# Patient Record
Sex: Female | Born: 1985 | Race: Black or African American | Hispanic: No | Marital: Single
Health system: Southern US, Community
[De-identification: ages and names within clinical notes are randomized; demographics above are authoritative.]

## PROBLEM LIST (undated history)

## (undated) ENCOUNTER — Inpatient Hospital Stay (HOSPITAL_COMMUNITY): Payer: Self-pay

## (undated) ENCOUNTER — Ambulatory Visit (HOSPITAL_COMMUNITY): Discharge status: Against Medical Advice | Payer: No Payment, Other

## (undated) DIAGNOSIS — F1290 Cannabis use, unspecified, uncomplicated: Secondary | ICD-10-CM

## (undated) DIAGNOSIS — F17200 Nicotine dependence, unspecified, uncomplicated: Secondary | ICD-10-CM

## (undated) DIAGNOSIS — A6000 Herpesviral infection of urogenital system, unspecified: Secondary | ICD-10-CM

## (undated) DIAGNOSIS — N63 Unspecified lump in unspecified breast: Secondary | ICD-10-CM

## (undated) HISTORY — PX: BREAST SURGERY: SHX581

## (undated) HISTORY — DX: Cannabis use, unspecified, uncomplicated: F12.90

## (undated) HISTORY — DX: Unspecified lump in unspecified breast: N63.0

## (undated) HISTORY — DX: Nicotine dependence, unspecified, uncomplicated: F17.200

---
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
## 2006-01-28 ENCOUNTER — Emergency Department (HOSPITAL_COMMUNITY): Admission: EM | Admit: 2006-01-28 | Discharge: 2006-01-28 | Payer: Self-pay | Admitting: Emergency Medicine

## 2007-03-13 ENCOUNTER — Inpatient Hospital Stay (HOSPITAL_COMMUNITY): Admission: AD | Admit: 2007-03-13 | Discharge: 2007-03-13 | Payer: Self-pay | Admitting: Obstetrics & Gynecology

## 2008-01-10 ENCOUNTER — Emergency Department (HOSPITAL_COMMUNITY): Admission: EM | Admit: 2008-01-10 | Discharge: 2008-01-10 | Payer: Self-pay | Admitting: Emergency Medicine

## 2008-01-22 ENCOUNTER — Emergency Department (HOSPITAL_COMMUNITY): Admission: EM | Admit: 2008-01-22 | Discharge: 2008-01-22 | Payer: Self-pay | Admitting: Emergency Medicine

## 2008-03-08 ENCOUNTER — Inpatient Hospital Stay (HOSPITAL_COMMUNITY): Admission: AD | Admit: 2008-03-08 | Discharge: 2008-03-08 | Payer: Self-pay | Admitting: Obstetrics & Gynecology

## 2008-12-12 HISTORY — PX: NOSE SURGERY: SHX723

## 2009-08-29 ENCOUNTER — Emergency Department (HOSPITAL_COMMUNITY): Admission: EM | Admit: 2009-08-29 | Discharge: 2009-08-29 | Payer: Self-pay | Admitting: Emergency Medicine

## 2009-10-03 ENCOUNTER — Inpatient Hospital Stay (HOSPITAL_COMMUNITY): Admission: AD | Admit: 2009-10-03 | Discharge: 2009-10-04 | Payer: Self-pay | Admitting: Family Medicine

## 2010-05-13 ENCOUNTER — Emergency Department (HOSPITAL_COMMUNITY): Admission: EM | Admit: 2010-05-13 | Discharge: 2010-05-13 | Payer: Self-pay | Admitting: Emergency Medicine

## 2010-05-16 ENCOUNTER — Emergency Department (HOSPITAL_COMMUNITY): Admission: EM | Admit: 2010-05-16 | Discharge: 2010-05-16 | Payer: Self-pay | Admitting: Emergency Medicine

## 2011-02-28 LAB — BASIC METABOLIC PANEL
BUN: 7 mg/dL (ref 6–23)
Chloride: 109 mEq/L (ref 96–112)
GFR calc non Af Amer: >60 mL/min (ref >60)
Potassium: 3.7 mEq/L (ref 3.5–5.1)
Sodium: 140 mEq/L (ref 135–145)

## 2011-02-28 LAB — CBC
HCT: 33.7 % — ABNORMAL LOW (ref 36.0–46.0)
Hemoglobin: 11.4 g/dL — ABNORMAL LOW (ref 12.0–15.0)
MCHC: 33.9 g/dL (ref 30.0–36.0)
MCV: 88.5 fL (ref 78.0–100.0)
Platelets: 330 10*3/uL (ref 150–400)
RBC: 3.81 MIL/uL — ABNORMAL LOW (ref 3.87–5.11)
RDW: 15 % (ref 11.5–15.5)
WBC: 6 10*3/uL (ref 4.0–10.5)

## 2011-02-28 LAB — URINE MICROSCOPIC-ADD ON

## 2011-02-28 LAB — URINALYSIS, ROUTINE W REFLEX MICROSCOPIC
Bilirubin Urine: NEGATIVE
Hgb urine dipstick: NEGATIVE
Nitrite: NEGATIVE
Specific Gravity, Urine: 1.025 (ref 1.005–1.030)
Urobilinogen, UA: 0.2 mg/dL (ref 0.0–1.0)
pH: 7.5 (ref 5.0–8.0)

## 2011-02-28 LAB — RAPID URINE DRUG SCREEN, HOSP PERFORMED
Amphetamines: NOT DETECTED
Opiates: NOT DETECTED
Tetrahydrocannabinol: POSITIVE — AB

## 2011-02-28 LAB — D-DIMER, QUANTITATIVE

## 2011-02-28 LAB — DIFFERENTIAL
Basophils Relative: 1 % (ref 0–1)
Eosinophils Absolute: 0.1 10*3/uL (ref 0.0–0.7)
Neutrophils Relative %: 54 % (ref 43–77)

## 2011-03-17 LAB — GC/CHLAMYDIA PROBE AMP, GENITAL

## 2011-04-29 NOTE — Discharge Summary (Signed)
[facility]  Patient:    Christy West, Christy West Visit Number: [id number] MRN: [id number]          Service Type: GYN Location: [id number] Attending Physician:  Douglass Rivers Dictated by:   Antony Contras, N.P. Admit Date:  07/28/2001 Discharge Date: 07/30/2001                             Discharge Summary  DISCHARGE DIAGNOSES:          Positive GC culture, questionable pelvic inflammatory disease.  HISTORY OF PRESENT ILLNESS:   Patient is a 25 year old sexually promiscuous female who presents with mid epigastric pain radiating to the right causing her to double over which brought her to the hospital.  Prior to the hospital emergency room temperature was 101.3 on admission.  On examination per Dr. Farrel Gobble she was noted to have frank pus from the cervix.  The wet prep was too numerous to count wbcs, no BV, no yeast.  Pelvic tenderness was also noted. The patient did report a history of seven sexual partners.  Her last intercourse was two weeks ago.  She has never had a gynecologic examination. She was admitted for observation and also antibiotic therapy.  HOSPITAL COURSE:              Ultrasound revealed a small amount of free fluid, no abscess, extensive loops of bowel, bloated rectum.  CT with rectal contrast:  No perforation, dilated loops of bowel, fluid, appendix not visualized.  Patient was treated with Rocephin and doxycycline.  HIV, RPR were also obtained.  Her gonorrhea culture did return positive.  She was also complaining of some pain with urination so a urine culture was also checked. She was able to be discharged on July 30, 2001 in satisfactory condition. She is to follow-up in the office in two weeks for recheck and in four weeks for TOC.  She also needs to have a Pap smear.  She was discharged also on Floxin 400 mg b.i.d. #10.  Discharge home instructions were given to her grandmother. Dictated by:   Antony Contras, N.P. Attending  Physician:  Douglass Rivers DD:  08/10/01 TD:  08/10/01 Job: [id number]

## 2011-06-06 ENCOUNTER — Emergency Department (HOSPITAL_COMMUNITY)
Admission: EM | Admit: 2011-06-06 | Discharge: 2011-06-06 | Discharge status: Against Medical Advice | Payer: Self-pay | Attending: Emergency Medicine | Admitting: Emergency Medicine

## 2011-06-06 DIAGNOSIS — Z0389 Encounter for observation for other suspected diseases and conditions ruled out: Secondary | ICD-10-CM | POA: Insufficient documentation

## 2011-09-05 LAB — WET PREP, GENITAL

## 2011-09-05 LAB — POCT PREGNANCY, URINE

## 2013-03-02 ENCOUNTER — Inpatient Hospital Stay (HOSPITAL_COMMUNITY)
Admission: AD | Admit: 2013-03-02 | Discharge: 2013-03-02 | Disposition: A | Discharge status: Home / Self Care | Payer: Self-pay | Source: Ambulatory Visit | Attending: Obstetrics and Gynecology | Admitting: Obstetrics and Gynecology

## 2013-03-02 ENCOUNTER — Encounter (HOSPITAL_COMMUNITY): Payer: Self-pay | Admitting: Obstetrics and Gynecology

## 2013-03-02 DIAGNOSIS — R209 Unspecified disturbances of skin sensation: Secondary | ICD-10-CM | POA: Insufficient documentation

## 2013-03-02 DIAGNOSIS — R109 Unspecified abdominal pain: Secondary | ICD-10-CM | POA: Insufficient documentation

## 2013-03-02 DIAGNOSIS — N949 Unspecified condition associated with female genital organs and menstrual cycle: Secondary | ICD-10-CM | POA: Insufficient documentation

## 2013-03-02 LAB — URINE MICROSCOPIC-ADD ON

## 2013-03-02 LAB — URINALYSIS, ROUTINE W REFLEX MICROSCOPIC
Glucose, UA: NEGATIVE mg/dL
Ketones, ur: NEGATIVE mg/dL
Protein, ur: NEGATIVE mg/dL
pH: 6 (ref 5.0–8.0)

## 2013-03-02 NOTE — MAU Note (Signed)
"  I've been having a discharge and a tingling.  No itching or burning.  It has been for about a month."

## 2013-03-02 NOTE — MAU Note (Signed)
Christy West presents with Lower abdominal pain that started 2-3 weeks ago. She has had 2 menstrual cycles this month and feels like something is not right.

## 2013-03-03 ENCOUNTER — Encounter (HOSPITAL_COMMUNITY): Payer: Self-pay | Admitting: Emergency Medicine

## 2013-03-03 ENCOUNTER — Emergency Department (HOSPITAL_COMMUNITY): Payer: Self-pay

## 2013-03-03 ENCOUNTER — Emergency Department (HOSPITAL_COMMUNITY)
Admission: EM | Admit: 2013-03-03 | Discharge: 2013-03-03 | Disposition: A | Discharge status: Home / Self Care | Payer: Self-pay | Attending: Emergency Medicine | Admitting: Emergency Medicine

## 2013-03-03 DIAGNOSIS — N949 Unspecified condition associated with female genital organs and menstrual cycle: Secondary | ICD-10-CM | POA: Insufficient documentation

## 2013-03-03 DIAGNOSIS — R109 Unspecified abdominal pain: Secondary | ICD-10-CM

## 2013-03-03 DIAGNOSIS — N898 Other specified noninflammatory disorders of vagina: Secondary | ICD-10-CM | POA: Insufficient documentation

## 2013-03-03 DIAGNOSIS — R1031 Right lower quadrant pain: Secondary | ICD-10-CM | POA: Insufficient documentation

## 2013-03-03 DIAGNOSIS — Z3202 Encounter for pregnancy test, result negative: Secondary | ICD-10-CM | POA: Insufficient documentation

## 2013-03-03 DIAGNOSIS — F17200 Nicotine dependence, unspecified, uncomplicated: Secondary | ICD-10-CM | POA: Insufficient documentation

## 2013-03-03 LAB — URINE MICROSCOPIC-ADD ON

## 2013-03-03 LAB — URINALYSIS, ROUTINE W REFLEX MICROSCOPIC
Ketones, ur: NEGATIVE mg/dL
Nitrite: NEGATIVE
Protein, ur: NEGATIVE mg/dL
Urobilinogen, UA: 0.2 mg/dL (ref 0.0–1.0)

## 2013-03-03 LAB — WET PREP, GENITAL
Clue Cells Wet Prep HPF POC: NONE SEEN
Trich, Wet Prep: NONE SEEN
Yeast Wet Prep HPF POC: NONE SEEN

## 2013-03-03 LAB — CBC WITH DIFFERENTIAL/PLATELET
Eosinophils Relative: 1 % (ref 0–5)
HCT: 33.6 % — ABNORMAL LOW (ref 36.0–46.0)
Hemoglobin: 11.4 g/dL — ABNORMAL LOW (ref 12.0–15.0)
Lymphocytes Relative: 31 % (ref 12–46)
Lymphs Abs: 2.2 10*3/uL (ref 0.7–4.0)
MCV: 83.6 fL (ref 78.0–100.0)
Monocytes Absolute: 0.6 10*3/uL (ref 0.1–1.0)
Monocytes Relative: 8 % (ref 3–12)
Neutro Abs: 4.4 10*3/uL (ref 1.7–7.7)
RBC: 4.02 MIL/uL (ref 3.87–5.11)
WBC: 7.3 10*3/uL (ref 4.0–10.5)

## 2013-03-03 LAB — URINALYSIS, MICROSCOPIC ONLY
Bilirubin Urine: NEGATIVE
Nitrite: NEGATIVE
Protein, ur: NEGATIVE mg/dL
Specific Gravity, Urine: 1.029 (ref 1.005–1.030)
Urobilinogen, UA: 0.2 mg/dL (ref 0.0–1.0)

## 2013-03-03 LAB — COMPREHENSIVE METABOLIC PANEL
AST: 12 U/L (ref 0–37)
CO2: 26 mEq/L (ref 19–32)
Calcium: 9.7 mg/dL (ref 8.4–10.5)
Chloride: 101 mEq/L (ref 96–112)
Creatinine, Ser: 0.74 mg/dL (ref 0.50–1.10)
GFR calc Af Amer: >90 mL/min (ref >90)
GFR calc non Af Amer: >90 mL/min (ref >90)
Glucose, Bld: 103 mg/dL — ABNORMAL HIGH (ref 70–99)
Total Bilirubin: 0.1 mg/dL — ABNORMAL LOW (ref 0.3–1.2)

## 2013-03-03 MED ORDER — IBUPROFEN 600 MG PO TABS: 600.0000 mg | ORAL_TABLET | Freq: Four times a day (QID) | ORAL | Status: DC | PRN

## 2013-03-03 MED ORDER — IOHEXOL 300 MG/ML  SOLN
50.0000 mL | Freq: Once | INTRAMUSCULAR | Status: AC | PRN
  Administered 2013-03-03: 50 mL via ORAL

## 2013-03-03 MED ORDER — IOHEXOL 300 MG/ML  SOLN
100.0000 mL | Freq: Once | INTRAMUSCULAR | Status: AC | PRN
  Administered 2013-03-03: 100 mL via INTRAVENOUS

## 2013-03-03 MED ORDER — HYDROCODONE-ACETAMINOPHEN 5-325 MG PO TABS: ORAL_TABLET | ORAL | Status: DC

## 2013-03-03 NOTE — ED Notes (Signed)
Pt is having lower abdominal/pelvic pain. Pt states that she is having burning and pressure in her pelvic area when she urinates. Pt states that she also has discharge which is normal for her recently in the last few months. Pt denies vaginal bleeding. Pt denies problems with bowels.

## 2013-03-03 NOTE — ED Notes (Signed)
11- Ct not completed yet. Clicked in error

## 2013-03-03 NOTE — ED Notes (Signed)
Pt has completed PO contrast. CT notified. Pt is sleeping.

## 2013-03-03 NOTE — ED Notes (Signed)
Returned from CT.

## 2013-03-03 NOTE — ED Notes (Signed)
Pt is in CT

## 2013-03-03 NOTE — ED Provider Notes (Signed)
6:05 AM Handoff from [facility] NP -- patient with 2 weeks RLQ pain. Work-up largely unconcerning, CT pending to r/o appendicitis, other significant etiology. Pelvic exam benign without discharge.   7:23 AM CT scan reviewed by myself. No acute findings to explain patient's abdominal pain.  Patient seen and examined. Abdomen is soft and nontender. Patient informed of all results.  The patient was urged to return to the Emergency Department immediately with worsening of current symptoms, worsening abdominal pain, persistent vomiting, blood noted in stools, fever, or any other concerns. The patient verbalized understanding.   Patient urged followup with primary care physician for further evaluation of her pain.  Patient counseled on use of narcotic pain medications. Counseled not to combine these medications with others containing tylenol. Urged not to drink alcohol, drive, or perform any other activities that requires focus while taking these medications. The patient verbalizes understanding and agrees with the plan.  Exam:  Gen NAD; Heart RRR, nml S1,S2, no m/r/g; Lungs CTAB; Abd soft, NT, no rebound or guarding; Ext 2+ pedal pulses bilaterally, no edema.     Renne Crigler, PA-C 03/03/13 [phone]

## 2013-03-03 NOTE — ED Notes (Signed)
Pt reports 2 weeks of having pain to R lower abd/groin with radiation to L side and pain in lower back; pt reports very small amt of pain when voiding; pt reports on 2nd period of month; pt denies n/v

## 2013-03-03 NOTE — ED Provider Notes (Signed)
History     CSN: [id number]  Arrival date & time 03/03/13  0108   First MD Initiated Contact with Patient 03/03/13 0208      Chief Complaint  Patient presents with  . Abdominal Pain    (Consider location/radiation/quality/duration/timing/severity/associated sxs/prior treatment) HPI Comments: Patient reports she's had 3, weeks of persistent right lower quadrant pain, radiating to her groin has a slight vaginal discharge.  Denies dysuria, frequency, comes in tonight because the pain is worse.  She also reports, that she's had 2 menstrual cycles.  This month.  She has one sexual partner/husband has been out of town for the past 90 days.   Patient is a 27 y.o. female presenting with abdominal pain. The history is provided by the patient.  Abdominal Pain Pain location:  RLQ Pain quality: aching, fullness and stabbing   Pain radiates to:  Does not radiate Pain severity:  Moderate Onset quality:  Gradual Duration:  3 weeks Timing:  Constant Progression:  Worsening Chronicity:  New Context: not recent illness, not recent sexual activity, not recent travel and not trauma   Relieved by:  Nothing Worsened by:  Nothing tried Ineffective treatments:  None tried Associated symptoms: vaginal discharge   Associated symptoms: no chills, no constipation, no cough, no diarrhea, no dysuria, no fever and no vaginal bleeding     History reviewed. No pertinent past medical history.  Past Surgical History  Procedure Laterality Date  . Nose surgery  2010    repair    History reviewed. No pertinent family history.  History  Substance Use Topics  . Smoking status: Current Every Day Smoker -- 1.00 packs/day    Types: Cigarettes  . Smokeless tobacco: Not on file  . Alcohol Use: Yes    OB History   Grav Para Term Preterm Abortions TAB SAB Ect Mult Living   0               Review of Systems  Constitutional: Negative for fever and chills.  Respiratory: Negative for cough.     Gastrointestinal: Positive for abdominal pain. Negative for diarrhea and constipation.  Genitourinary: Positive for vaginal discharge, menstrual problem and pelvic pain. Negative for dysuria, urgency, flank pain, decreased urine volume and vaginal bleeding.  Musculoskeletal: Negative for myalgias and back pain.  All other systems reviewed and are negative.    Allergies  Review of patient's allergies indicates no known allergies.  Home Medications   Current Outpatient Rx  Name  Route  Sig  Dispense  Refill  . HYDROcodone-acetaminophen (NORCO/VICODIN) 5-325 MG per tablet      Take 1-2 tablets every 6 hours as needed for severe pain   10 tablet   0   . ibuprofen (ADVIL,MOTRIN) 600 MG tablet   Oral   Take 1 tablet (600 mg total) by mouth every 6 (six) hours as needed for pain.   20 tablet   0     BP 111/69  Pulse 74  Temp(Src) 97.9 F (36.6 C) (Oral)  Resp 16  SpO2 98%  LMP 02/13/2013  Physical Exam  Constitutional: She appears well-developed and well-nourished.  HENT:  Head: Normocephalic.  Eyes: Pupils are equal, round, and reactive to light.  Neck: Normal range of motion.  Cardiovascular: Normal rate.   Pulmonary/Chest: Effort normal.  Abdominal: Bowel sounds are normal. She exhibits no distension. There is no hepatosplenomegaly. There is tenderness in the right lower quadrant. There is no rigidity, no rebound, no guarding, no CVA tenderness and negative Murphy's  sign.      ED Course  Procedures (including critical care time)  Labs Reviewed  WET PREP, GENITAL - Abnormal; Notable for the following:    WBC, Wet Prep HPF POC MODERATE (*)    All other components within normal limits  URINALYSIS, MICROSCOPIC ONLY - Abnormal; Notable for the following:    Hgb urine dipstick MODERATE (*)    Bacteria, UA MANY (*)    Squamous Epithelial / LPF FEW (*)    All other components within normal limits  CBC WITH DIFFERENTIAL - Abnormal; Notable for the following:     Hemoglobin 11.4 (*)    HCT 33.6 (*)    All other components within normal limits  COMPREHENSIVE METABOLIC PANEL - Abnormal; Notable for the following:    Glucose, Bld 103 (*)    Total Bilirubin 0.1 (*)    All other components within normal limits  URINALYSIS, ROUTINE W REFLEX MICROSCOPIC - Abnormal; Notable for the following:    APPearance CLOUDY (*)    Leukocytes, UA TRACE (*)    All other components within normal limits  URINE MICROSCOPIC-ADD ON - Abnormal; Notable for the following:    Squamous Epithelial / LPF FEW (*)    Bacteria, UA FEW (*)    All other components within normal limits  URINE CULTURE  GC/CHLAMYDIA PROBE AMP  URINE CULTURE  POCT PREGNANCY, URINE   Ct Abdomen Pelvis W Contrast  03/03/2013  *RADIOLOGY REPORT*  Clinical Data: Pelvic pain  CT ABDOMEN AND PELVIS WITH CONTRAST  Technique:  Multidetector CT imaging of the abdomen and pelvis was performed following the standard protocol during bolus administration of intravenous contrast.  Contrast: OMNIPAQUE IOHEXOL 300 MG/ML  SOLN, 50mL OMNIPAQUE IOHEXOL 300 MG/ML  SOLN  Comparison: None.  Findings: Limited images through the lung bases demonstrate no significant appreciable abnormality. The heart size is within normal limits. No pleural or pericardial effusion.  Unremarkable liver, spleen, pancreas, biliary system, adrenal glands.  Symmetric renal enhancement.  No hydronephrosis or hydroureter.  Decompressed colon limits evaluation.  Normal appendix.  Normal terminal ileum.  Small bowel loops normal course and caliber.  No free intraperitoneal air.  No lymphadenopathy.  Small amount of free fluid along the inferior margin of the liver, right paracolic gutter, and within the pelvis.  Normal caliber aorta and branch vessels.  Thin-walled bladder.  Nonspecific appearance to the uterus. Probable Nabothian cyst.  Right corpus luteal cyst.  No adnexal mass.  No acute osseous finding.  IMPRESSION: Normal appendix.  No  hydronephrosis.  Small amount of free intraperitoneal fluid is likely physiologic.  Pelvic ultrasound has increased sensitivity and specificity if there is clinical concern for acute pelvic pathology.   Original Report Authenticated By: Jearld Lesch, M.D.      1. Abdominal pain       MDM           Arman Filter, NP 03/04/13 [phone]

## 2013-03-04 LAB — URINE CULTURE
Colony Count: NO GROWTH
Culture: NO GROWTH

## 2013-03-04 NOTE — ED Provider Notes (Signed)
Medical screening examination/treatment/procedure(s) were performed by non-physician practitioner and as supervising physician I was immediately available for consultation/collaboration.   Charles B. Sheldon, MD 03/04/13 1315 

## 2013-03-04 NOTE — ED Provider Notes (Signed)
Medical screening examination/treatment/procedure(s) were performed by non-physician practitioner and as supervising physician I was immediately available for consultation/collaboration.   Charles B. Bernette Mayers, MD 03/04/13 1315

## 2013-03-05 ENCOUNTER — Encounter (HOSPITAL_COMMUNITY): Payer: Self-pay | Admitting: *Deleted

## 2013-03-05 ENCOUNTER — Inpatient Hospital Stay (HOSPITAL_COMMUNITY): Payer: Self-pay

## 2013-03-05 ENCOUNTER — Inpatient Hospital Stay (HOSPITAL_COMMUNITY)
Admission: AD | Admit: 2013-03-05 | Discharge: 2013-03-05 | Disposition: A | Discharge status: Home / Self Care | Payer: Self-pay | Source: Ambulatory Visit | Attending: Family Medicine | Admitting: Family Medicine

## 2013-03-05 DIAGNOSIS — R1031 Right lower quadrant pain: Secondary | ICD-10-CM | POA: Insufficient documentation

## 2013-03-05 DIAGNOSIS — N926 Irregular menstruation, unspecified: Secondary | ICD-10-CM | POA: Insufficient documentation

## 2013-03-05 DIAGNOSIS — N739 Female pelvic inflammatory disease, unspecified: Secondary | ICD-10-CM | POA: Insufficient documentation

## 2013-03-05 DIAGNOSIS — A749 Chlamydial infection, unspecified: Secondary | ICD-10-CM

## 2013-03-05 DIAGNOSIS — N730 Acute parametritis and pelvic cellulitis: Secondary | ICD-10-CM | POA: Insufficient documentation

## 2013-03-05 DIAGNOSIS — A5619 Other chlamydial genitourinary infection: Secondary | ICD-10-CM | POA: Insufficient documentation

## 2013-03-05 DIAGNOSIS — N949 Unspecified condition associated with female genital organs and menstrual cycle: Secondary | ICD-10-CM | POA: Insufficient documentation

## 2013-03-05 LAB — CBC WITH DIFFERENTIAL/PLATELET
Basophils Absolute: 0 10*3/uL (ref 0.0–0.1)
Basophils Relative: 0 % (ref 0–1)
Eosinophils Absolute: 0 10*3/uL (ref 0.0–0.7)
Hemoglobin: 11.9 g/dL — ABNORMAL LOW (ref 12.0–15.0)
MCH: 28.1 pg (ref 26.0–34.0)
MCHC: 33.7 g/dL (ref 30.0–36.0)
Monocytes Absolute: 0.5 10*3/uL (ref 0.1–1.0)
Monocytes Relative: 8 % (ref 3–12)
Neutrophils Relative %: 66 % (ref 43–77)
RDW: 14.4 % (ref 11.5–15.5)

## 2013-03-05 MED ORDER — AZITHROMYCIN 500 MG PO TABS: 1000.0000 mg | ORAL_TABLET | Freq: Once | ORAL | Status: DC

## 2013-03-05 MED ORDER — CEFTRIAXONE SODIUM 250 MG IJ SOLR
250.0000 mg | INTRAMUSCULAR | Status: AC
  Administered 2013-03-05: 250 mg via INTRAMUSCULAR
  Filled 2013-03-05: qty 250

## 2013-03-05 MED ORDER — AZITHROMYCIN 250 MG PO TABS
1000.0000 mg | ORAL_TABLET | Freq: Once | ORAL | Status: AC
  Administered 2013-03-05: 1000 mg via ORAL
  Filled 2013-03-05: qty 4

## 2013-03-05 NOTE — MAU Note (Signed)
Pt states has been bleeding x 60 days, cramping. Not on birth control pills. Sometimes will bleed and pt needs 2 pads per hour, sometimes will be very slow. Was in MAU recently and had argument/issue with RN, and pt left. Pt tearful in room

## 2013-03-05 NOTE — MAU Provider Note (Signed)
Chief Complaint: Vaginal Bleeding   None    SUBJECTIVE HPI: Christy West is a 27 y.o. G0P0 who presents to maternity admissions reporting irregular menses for last 2 months, including bleeding/spotting x60 days, vaginal discharge, and RLQ abdominal pain.  She was seen in ED 2 days ago and had CT scan which was normal and pelvic exam with cultures.  Her pain continues and she came to MAU for pelvic ultrasound, as suggested at the ED.  She reports scant vaginal bleeding today, and denies vaginal itching/burning, urinary symptoms, h/a, dizziness, n/v, or fever/chills.     Past Medical History  Diagnosis Date  . Medical history non-contributory    Past Surgical History  Procedure Laterality Date  . Nose surgery  2010    repair   History   Social History  . Marital Status: Single    Spouse Name: N/A    Number of Children: N/A  . Years of Education: N/A   Occupational History  . Not on file.   Social History Main Topics  . Smoking status: Current Every Day Smoker -- 1.00 packs/day    Types: Cigarettes  . Smokeless tobacco: Not on file  . Alcohol Use: Yes  . Drug Use: Yes    Special: Marijuana  . Sexually Active: Yes    Birth Control/ Protection: None   Other Topics Concern  . Not on file   Social History Narrative  . No narrative on file   No current facility-administered medications on file prior to encounter.   Current Outpatient Prescriptions on File Prior to Encounter  Medication Sig Dispense Refill  . HYDROcodone-acetaminophen (NORCO/VICODIN) 5-325 MG per tablet Take 1-2 tablets every 6 hours as needed for severe pain  10 tablet  0  . ibuprofen (ADVIL,MOTRIN) 600 MG tablet Take 1 tablet (600 mg total) by mouth every 6 (six) hours as needed for pain.  20 tablet  0   No Known Allergies  ROS: Pertinent items in HPI  OBJECTIVE Blood pressure 110/70, pulse 103, temperature 98.3 F (36.8 C), temperature source Oral, resp. rate 20, height 5\' 6"  (1.676 m), weight  66.735 kg (147 lb 2 oz), last menstrual period 01/23/2013. GENERAL: Well-developed, well-nourished female in no acute distress.  HEENT: Normocephalic HEART: normal rate RESP: normal effort ABDOMEN: Soft, non-tender EXTREMITIES: Nontender, no edema NEURO: Alert and oriented SPECULUM EXAM: Deferred--done 03/04/23   Pt positive for Chlamydia on swab collected 03/03/13  LAB RESULTS Results for orders placed during the hospital encounter of 03/05/13 (from the past 24 hour(s))  CBC WITH DIFFERENTIAL     Status: Abnormal   Collection Time    03/05/13 12:05 PM      Result Value Range   WBC 7.1  4.0 - 10.5 K/uL   RBC 4.24  3.87 - 5.11 MIL/uL   Hemoglobin 11.9 (*) 12.0 - 15.0 g/dL   HCT 16.1 (*) 09.6 - 04.5 %   MCV 83.3  78.0 - 100.0 fL   MCH 28.1  26.0 - 34.0 pg   MCHC 33.7  30.0 - 36.0 g/dL   RDW 40.9  81.1 - 91.4 %   Platelets 284  150 - 400 K/uL   Neutrophils Relative 66  43 - 77 %   Neutro Abs 4.7  1.7 - 7.7 K/uL   Lymphocytes Relative 26  12 - 46 %   Lymphs Abs 1.9  0.7 - 4.0 K/uL   Monocytes Relative 8  3 - 12 %   Monocytes Absolute 0.5  0.1 -  1.0 K/uL   Eosinophils Relative 1  0 - 5 %   Eosinophils Absolute 0.0  0.0 - 0.7 K/uL   Basophils Relative 0  0 - 1 %   Basophils Absolute 0.0  0.0 - 0.1 K/uL    IMAGING US Transvaginal Non-ob  03/05/2013  *RADIOLOGY REPORT*  Clinical Data: Bleeding for 10 days with pelvic pain.  Right lower quadrant pain.  LMP 02/13/2013.  TRANSABDOMINAL AND TRANSVAGINAL ULTRASOUND OF PELVIS Technique:  Both transabdominal and transvaginal ultrasound examinations of the pelvis were performed. Transabdominal technique was performed for global imaging of the pelvis including uterus, ovaries, adnexal regions, and pelvic cul-de-sac.  It was necessary to proceed with endovaginal exam following the transabdominal exam to visualize the endometrium and ovaries.  Comparison:  None  Findings:  Uterus: The uterus is anteverted and measures 8.3 x 3.8 x 5.0 cm. The  myometrium is slightly heterogeneous.  No focal uterine mass is identified.  The junctional zone is well delineated.  Endometrium: Normal in thickness in appearance.  Measures 11 mm maximal thickness.  The endometrium is trilaminar.  Right ovary:  Normal appearance/no adnexal mass.  Measures 4.1 x 1.6 x 2.5 cm.  Left ovary: Normal appearance/no adnexal mass.  Measures 2.5 x 1.7 x 1.5 cm in  Other findings: There are is a trace amount of free pelvic fluid.  IMPRESSION: No evidence of pelvic mass or other significant abnormality.   Original Report Authenticated By: Britta Mccreedy, M.D.    US Pelvis Complete  03/05/2013  *RADIOLOGY REPORT*  Clinical Data: Bleeding for 10 days with pelvic pain.  Right lower quadrant pain.  LMP 02/13/2013.  TRANSABDOMINAL AND TRANSVAGINAL ULTRASOUND OF PELVIS Technique:  Both transabdominal and transvaginal ultrasound examinations of the pelvis were performed. Transabdominal technique was performed for global imaging of the pelvis including uterus, ovaries, adnexal regions, and pelvic cul-de-sac.  It was necessary to proceed with endovaginal exam following the transabdominal exam to visualize the endometrium and ovaries.  Comparison:  None  Findings:  Uterus: The uterus is anteverted and measures 8.3 x 3.8 x 5.0 cm. The myometrium is slightly heterogeneous.  No focal uterine mass is identified.  The junctional zone is well delineated.  Endometrium: Normal in thickness in appearance.  Measures 11 mm maximal thickness.  The endometrium is trilaminar.  Right ovary:  Normal appearance/no adnexal mass.  Measures 4.1 x 1.6 x 2.5 cm.  Left ovary: Normal appearance/no adnexal mass.  Measures 2.5 x 1.7 x 1.5 cm in  Other findings: There are is a trace amount of free pelvic fluid.  IMPRESSION: No evidence of pelvic mass or other significant abnormality.   Original Report Authenticated By: Britta Mccreedy, M.D.    Ct Abdomen Pelvis W Contrast  03/03/2013  *RADIOLOGY REPORT*  Clinical Data: Pelvic  pain  CT ABDOMEN AND PELVIS WITH CONTRAST  Technique:  Multidetector CT imaging of the abdomen and pelvis was performed following the standard protocol during bolus administration of intravenous contrast.  Contrast: OMNIPAQUE IOHEXOL 300 MG/ML  SOLN, 50mL OMNIPAQUE IOHEXOL 300 MG/ML  SOLN  Comparison: None.  Findings: Limited images through the lung bases demonstrate no significant appreciable abnormality. The heart size is within normal limits. No pleural or pericardial effusion.  Unremarkable liver, spleen, pancreas, biliary system, adrenal glands.  Symmetric renal enhancement.  No hydronephrosis or hydroureter.  Decompressed colon limits evaluation.  Normal appendix.  Normal terminal ileum.  Small bowel loops normal course and caliber.  No free intraperitoneal air.  No lymphadenopathy.  Small amount of free fluid along the inferior margin of the liver, right paracolic gutter, and within the pelvis.  Normal caliber aorta and branch vessels.  Thin-walled bladder.  Nonspecific appearance to the uterus. Probable Nabothian cyst.  Right corpus luteal cyst.  No adnexal mass.  No acute osseous finding.  IMPRESSION: Normal appendix.  No hydronephrosis.  Small amount of free intraperitoneal fluid is likely physiologic.  Pelvic ultrasound has increased sensitivity and specificity if there is clinical concern for acute pelvic pathology.   Original Report Authenticated By: Jearld Lesch, M.D.     ASSESSMENT 1. Chlamydia   2. PID (acute pelvic inflammatory disease)     PLAN Rocephin 250 mg x1 dose IM and azithromycin 1000 mg x2 doses, 1 now and 1 prescribed to take in a week (alternative treatment per Up to Date)  Discharge home Discussed importance of partner treatment Pt concerned about fertility, has been unable to become pregnant despite no use of birth control in a few months, desires appointment in gyn clinic.  Discussed costs of fertility work-up and pt agrees to pay and desires follow up.  Message  sent to gyn clinic. Return to MAU as needed     Medication List    ASK your doctor about these medications       HYDROcodone-acetaminophen 5-325 MG per tablet  Commonly known as:  NORCO/VICODIN  Take 1-2 tablets every 6 hours as needed for severe pain     ibuprofen 600 MG tablet  Commonly known as:  ADVIL,MOTRIN  Take 1 tablet (600 mg total) by mouth every 6 (six) hours as needed for pain.         Sharen Counter Certified Nurse-Midwife 03/05/2013  1:48 PM

## 2013-03-05 NOTE — MAU Note (Signed)
Pt states she continues to have severe lower abd cramping, also lower back pain.  Continues to have bleeding every day for last 2 months.

## 2013-03-05 NOTE — MAU Provider Note (Signed)
Chart reviewed and agree with management and plan.  

## 2013-03-06 ENCOUNTER — Telehealth (HOSPITAL_COMMUNITY): Payer: Self-pay | Admitting: Emergency Medicine

## 2013-03-06 NOTE — ED Notes (Signed)
Results received from [facility]. (+) Chlamydia  3/25 treated with Zithromax and Rocephin at [facility].  [employer] form completed and faxed.  Call and notify patientt.

## 2013-03-07 ENCOUNTER — Encounter: Payer: Self-pay | Admitting: Medical

## 2013-03-09 ENCOUNTER — Telehealth (HOSPITAL_COMMUNITY): Payer: Self-pay | Admitting: Emergency Medicine

## 2013-03-10 ENCOUNTER — Telehealth (HOSPITAL_COMMUNITY): Payer: Self-pay | Admitting: Emergency Medicine

## 2013-03-10 NOTE — ED Notes (Signed)
Unable to contact patient via phone. Sent letter. °

## 2013-04-05 ENCOUNTER — Encounter: Payer: Self-pay | Admitting: Medical

## 2013-04-13 ENCOUNTER — Telehealth (HOSPITAL_COMMUNITY): Payer: Self-pay | Admitting: Emergency Medicine

## 2013-04-13 NOTE — ED Notes (Signed)
No response to letter sent after 30 days. Chart sent to Medical Records. °

## 2013-12-12 NOTE — L&D Delivery Note (Signed)
Delivery Note At 0003 a healthy female was delivered via spontaneous vaginal delivery (Presentation: LOA ;  ).  Placenta status: intact.  Cord: 3 vessel  with the following complications: None  Called to bedside when patient was complete and pushing. After 5 minutes of pushing, head was delivered OA over an intact perineum. No nuchal cord present. Shoulders and body delivered from the vagina without difficulty. Baby was placed on maternal abdomen. Cord was clamped and cut after 30 seconds. Baby handed to NICU team for evaluation. Cord and placenta were delivered with gentle traction. Placenta was intact. Vaginal bleeding was minimal and uterine massage applied. No lacerations present requiring repair. Bleeding stopped. Lap and instrument count were correct. Mother and baby left in good condition. Ready for transfer to postpartum.  Anesthesia:  Epidural Episiotomy: None Lacerations: None Suture Repair: n/a Est. Blood Loss (mL): 150  Mom to postpartum.  Baby to Nursery.  Delivery attended by Dwyane DeeMarie Dawn Convery  Stephens, Devin A 07/12/2014, 12:15 AM   Attended delivery Agree with note Aviva SignsMarie L Evea Sheek, CNM

## 2013-12-16 ENCOUNTER — Encounter (HOSPITAL_COMMUNITY): Payer: Self-pay | Admitting: Emergency Medicine

## 2013-12-16 ENCOUNTER — Emergency Department (HOSPITAL_COMMUNITY)
Admission: EM | Admit: 2013-12-16 | Discharge: 2013-12-17 | Disposition: A | Discharge status: Home / Self Care | Payer: Medicaid Other | Attending: Emergency Medicine | Admitting: Emergency Medicine

## 2013-12-16 DIAGNOSIS — F17200 Nicotine dependence, unspecified, uncomplicated: Secondary | ICD-10-CM | POA: Insufficient documentation

## 2013-12-16 DIAGNOSIS — M25519 Pain in unspecified shoulder: Secondary | ICD-10-CM | POA: Insufficient documentation

## 2013-12-16 DIAGNOSIS — R599 Enlarged lymph nodes, unspecified: Secondary | ICD-10-CM | POA: Insufficient documentation

## 2013-12-16 DIAGNOSIS — Y939 Activity, unspecified: Secondary | ICD-10-CM | POA: Insufficient documentation

## 2013-12-16 DIAGNOSIS — M545 Low back pain, unspecified: Secondary | ICD-10-CM | POA: Insufficient documentation

## 2013-12-16 DIAGNOSIS — Z3201 Encounter for pregnancy test, result positive: Secondary | ICD-10-CM | POA: Insufficient documentation

## 2013-12-16 DIAGNOSIS — M25559 Pain in unspecified hip: Secondary | ICD-10-CM | POA: Insufficient documentation

## 2013-12-16 DIAGNOSIS — Y92410 Unspecified street and highway as the place of occurrence of the external cause: Secondary | ICD-10-CM | POA: Insufficient documentation

## 2013-12-16 DIAGNOSIS — Z331 Pregnant state, incidental: Secondary | ICD-10-CM | POA: Insufficient documentation

## 2013-12-16 DIAGNOSIS — Z3490 Encounter for supervision of normal pregnancy, unspecified, unspecified trimester: Secondary | ICD-10-CM

## 2013-12-16 LAB — PREGNANCY, URINE: PREG TEST UR: POSITIVE — AB

## 2013-12-16 MED ORDER — PRENATAL COMPLETE 14-0.4 MG PO TABS: 1.0000 | ORAL_TABLET | Freq: Once | ORAL | Status: DC

## 2013-12-16 MED ORDER — ACETAMINOPHEN 325 MG PO TABS
650.0000 mg | ORAL_TABLET | Freq: Once | ORAL | Status: AC
  Administered 2013-12-17: 650 mg via ORAL
  Filled 2013-12-16: qty 2
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
## 2013-12-16 NOTE — Discharge Instructions (Signed)
ABCs of Pregnancy A Antepartum care is very important. Be sure you see your doctor and get prenatal care as soon as you think you are pregnant. At this time, you will be tested for infection, genetic abnormalities and potential problems with you and the pregnancy. This is the time to discuss diet, exercise, work, medications, labor, pain medication during labor and the possibility of a cesarean delivery. Ask any questions that may concern you. It is important to see your doctor regularly throughout your pregnancy. Avoid exposure to toxic substances and chemicals - such as cleaning solvents, lead and mercury, some insecticides, and paint. Pregnant women should avoid exposure to paint fumes, and fumes that cause you to feel ill, dizzy or faint. When possible, it is a good idea to have a pre-pregnancy consultation with your caregiver to begin some important recommendations your caregiver suggests such as, taking folic acid, exercising, quitting smoking, avoiding alcoholic beverages, etc. B Breastfeeding is the healthiest choice for both you and your baby. It has many nutritional benefits for the baby and health benefits for the mother. It also creates a very tight and loving bond between the baby and mother. Talk to your doctor, your family and friends, and your employer about how you choose to feed your baby and how they can support you in your decision. Not all birth defects can be prevented, but a woman can take actions that may increase her chance of having a healthy baby. Many birth defects happen very early in pregnancy, sometimes before a woman even knows she is pregnant. Birth defects or abnormalities of any child in your or the father's family should be discussed with your caregiver. Get a good support bra as your breast size changes. Wear it especially when you exercise and when nursing.  C Celebrate the news of your pregnancy with the your spouse/father and family. Childbirth classes are helpful to  take for you and the spouse/father because it helps to understand what happens during the pregnancy, labor and delivery. Cesarean delivery should be discussed with your doctor so you are prepared for that possibility. The pros and cons of circumcision if it is a boy, should be discussed with your pediatrician. Cigarette smoking during pregnancy can result in low birth weight babies. It has been associated with infertility, miscarriages, tubal pregnancies, infant death (mortality) and poor health (morbidity) in childhood. Additionally, cigarette smoking may cause long-term learning disabilities. If you smoke, you should try to quit before getting pregnant and not smoke during the pregnancy. Secondary smoke may also harm a mother and her developing baby. It is a good idea to ask people to stop smoking around you during your pregnancy and after the baby is born. Extra calcium is necessary when you are pregnant and is found in your prenatal vitamin, in dairy products, green leafy vegetables and in calcium supplements. D A healthy diet according to your current weight and height, along with vitamins and mineral supplements should be discussed with your caregiver. Domestic abuse or violence should be made known to your doctor right away to get the situation corrected. Drink more water when you exercise to keep hydrated. Discomfort of your back and legs usually develops and progresses from the middle of the second trimester through to delivery of the baby. This is because of the enlarging baby and uterus, which may also affect your balance. Do not take illegal drugs. Illegal drugs can seriously harm the baby and you. Drink extra fluids (water is best) throughout pregnancy to help  your body keep up with the increases in your blood volume. Drink at least 6 to 8 glasses of water, fruit juice, or milk each day. A good way to know you are drinking enough fluid is when your urine looks almost like clear water or is very light  yellow.  E Eat healthy to get the nutrients you and your unborn baby need. Your meals should include the five basic food groups. Exercise (30 minutes of light to moderate exercise a day) is important and encouraged during pregnancy, if there are no medical problems or problems with the pregnancy. Exercise that causes discomfort or dizziness should be stopped and reported to your caregiver. Emotions during pregnancy can change from being ecstatic to depression and should be understood by you, your partner and your family. F Fetal screening with ultrasound, amniocentesis and monitoring during pregnancy and labor is common and sometimes necessary. Take 400 micrograms of folic acid daily both before, when possible, and during the first few months of pregnancy to reduce the risk of birth defects of the brain and spine. All women who could possibly become pregnant should take a vitamin with folic acid, every day. It is also important to eat a healthy diet with fortified foods (enriched grain products, including cereals, rice, breads, and pastas) and foods with natural sources of folate (orange juice, green leafy vegetables, beans, peanuts, broccoli, asparagus, peas, and lentils). The father should be involved with all aspects of the pregnancy including, the prenatal care, childbirth classes, labor, delivery, and postpartum time. Fathers may also have emotional concerns about being a father, financial needs, and raising a family. G Genetic testing should be done appropriately. It is important to know your family and the father's history. If there have been problems with pregnancies or birth defects in your family, report these to your doctor. Also, genetic counselors can talk with you about the information you might need in making decisions about having a family. You can call a major medical center in your area for help in finding a board-certified genetic counselor. Genetic testing and counseling should be done  before pregnancy when possible, especially if there is a history of problems in the mother's or father's family. Certain ethnic backgrounds are more at risk for genetic defects. H Get familiar with the hospital where you will be having your baby. Get to know how long it takes to get there, the labor and delivery area, and the hospital procedures. Be sure your medical insurance is accepted there. Get your home ready for the baby including, clothes, the baby's room (when possible), furniture and car seat. Hand washing is important throughout the day, especially after handling raw meat and poultry, changing the baby's diaper or using the bathroom. This can help prevent the spread of many bacteria and viruses that cause infection. Your hair may become dry and thinner, but will return to normal a few weeks after the baby is born. Heartburn is a common problem that can be treated by taking antacids recommended by your caregiver, eating smaller meals 5 or 6 times a day, not drinking liquids when eating, drinking between meals and raising the head of your bed 2 to 3 inches. I Insurance to cover you, the baby, doctor and hospital should be reviewed so that you will be prepared to pay any costs not covered by your insurance plan. If you do not have medical insurance, there are usually clinics and services available for you in your community. Take 30 milligrams of iron during  your pregnancy as prescribed by your doctor to reduce the risk of low red blood cells (anemia) later in pregnancy. All women of childbearing age should eat a diet rich in iron. J There should be a joint effort for the mother, father and any other children to adapt to the pregnancy financially, emotionally, and psychologically during the pregnancy. Join a support group for moms-to-be. Or, join a class on parenting or childbirth. Have the family participate when possible. K Know your limits. Let your caregiver know if you experience any of the  following:   Pain of any kind.  Strong cramps.  You develop a lot of weight in a short period of time (5 pounds in 3 to 5 days).  Vaginal bleeding, leaking of amniotic fluid.  Headache, vision problems.  Dizziness, fainting, shortness of breath.  Chest pain.  Fever of 102 F (38.9 C) or higher.  Gush of clear fluid from your vagina.  Painful urination.  Domestic violence.  Irregular heartbeat (palpitations).  Rapid beating of the heart (tachycardia).  Constant feeling sick to your stomach (nauseous) and vomiting.  Trouble walking, fluid retention (edema).  Muscle weakness.  If your baby has decreased activity.  Persistent diarrhea.  Abnormal vaginal discharge.  Uterine contractions at 20-minute intervals.  Back pain that travels down your leg. L Learn and practice that what you eat and drink should be in moderation and healthy for you and your baby. Legal drugs such as alcohol and caffeine are important issues for pregnant women. There is no safe amount of alcohol a woman can drink while pregnant. Fetal alcohol syndrome, a disorder characterized by growth retardation, facial abnormalities, and central nervous system dysfunction, is caused by a woman's use of alcohol during pregnancy. Caffeine, found in tea, coffee, soft drinks and chocolate, should also be limited. Be sure to read labels when trying to cut down on caffeine during pregnancy. More than 200 foods, beverages, and over-the-counter medications contain caffeine and have a high salt content! There are coffees and teas that do not contain caffeine. M Medical conditions such as diabetes, epilepsy, and high blood pressure should be treated and kept under control before pregnancy when possible, but especially during pregnancy. Ask your caregiver about any medications that may need to be changed or adjusted during pregnancy. If you are currently taking any medications, ask your caregiver if it is safe to take them  while you are pregnant or before getting pregnant when possible. Also, be sure to discuss any herbs or vitamins you are taking. They are medicines, too! Discuss with your doctor all medications, prescribed and over-the-counter, that you are taking. During your prenatal visit, discuss the medications your doctor may give you during labor and delivery. N Never be afraid to ask your doctor or caregiver questions about your health, the progress of the pregnancy, family problems, stressful situations, and recommendation for a pediatrician, if you do not have one. It is better to take all precautions and discuss any questions or concerns you may have during your office visits. It is a good idea to write down your questions before you visit the doctor. O Over-the-counter cough and cold remedies may contain alcohol or other ingredients that should be avoided during pregnancy. Ask your caregiver about prescription, herbs or over-the-counter medications that you are taking or may consider taking while pregnant.  P Physical activity during pregnancy can benefit both you and your baby by lessening discomfort and fatigue, providing a sense of well-being, and increasing the likelihood of  early recovery after delivery. Light to moderate exercise during pregnancy strengthens the belly (abdominal) and back muscles. This helps improve posture. Practicing yoga, walking, swimming, and cycling on a stationary bicycle are usually safe exercises for pregnant women. Avoid scuba diving, exercise at high altitudes (over 3000 feet), skiing, horseback riding, contact sports, etc. Always check with your doctor before beginning any kind of exercise, especially during pregnancy and especially if you did not exercise before getting pregnant. Q Queasiness, stomach upset and morning sickness are common during pregnancy. Eating a couple of crackers or dry toast before getting out of bed. Foods that you normally love may make you feel sick to  your stomach. You may need to substitute other nutritious foods. Eating 5 or 6 small meals a day instead of 3 large ones may make you feel better. Do not drink with your meals, drink between meals. Questions that you have should be written down and asked during your prenatal visits. R Read about and make plans to baby-proof your home. There are important tips for making your home a safer environment for your baby. Review the tips and make your home safer for you and your baby. Read food labels regarding calories, salt and fat content in the food. S Saunas, hot tubs, and steam rooms should be avoided while you are pregnant. Excessive high heat may be harmful during your pregnancy. Your caregiver will screen and examine you for sexually transmitted diseases and genetic disorders during your prenatal visits. Learn the signs of labor. Sexual relations while pregnant is safe unless there is a medical or pregnancy problem and your caregiver advises against it. T Traveling long distances should be avoided especially in the third trimester of your pregnancy. If you do have to travel out of state, be sure to take a copy of your medical records and medical insurance plan with you. You should not travel long distances without seeing your doctor first. Most airlines will not allow you to travel after 36 weeks of pregnancy. Toxoplasmosis is an infection caused by a parasite that can seriously harm an unborn baby. Avoid eating undercooked meat and handling cat litter. Be sure to wear gloves when gardening. Tingling of the hands and fingers is not unusual and is due to fluid retention. This will go away after the baby is born. U Womb (uterus) size increases during the first trimester. Your kidneys will begin to function more efficiently. This may cause you to feel the need to urinate more often. You may also leak urine when sneezing, coughing or laughing. This is due to the growing uterus pressing against your bladder,  which lies directly in front of and slightly under the uterus during the first few months of pregnancy. If you experience burning along with frequency of urination or bloody urine, be sure to tell your doctor. The size of your uterus in the third trimester may cause a problem with your balance. It is advisable to maintain good posture and avoid wearing high heels during this time. An ultrasound of your baby may be necessary during your pregnancy and is safe for you and your baby. V Vaccinations are an important concern for pregnant women. Get needed vaccines before pregnancy. Center for Disease Control ([employer].com.brwww.cdc.gov) has clear guidelines for the use of vaccines during pregnancy. Review the list, be sure to discuss it with your doctor. Prenatal vitamins are helpful and healthy for you and the baby. Do not take extra vitamins except what is recommended. Taking too much of certain  vitamins can cause overdose problems. Continuous vomiting should be reported to your caregiver. Varicose veins may appear especially if there is a family history of varicose veins. They should subside after the delivery of the baby. Support hose helps if there is leg discomfort. W Being overweight or underweight during pregnancy may cause problems. Try to get within 15 pounds of your ideal weight before pregnancy. Remember, pregnancy is not a time to be dieting! Do not stop eating or start skipping meals as your weight increases. Both you and your baby need the calories and nutrition you receive from a healthy diet. Be sure to consult with your doctor about your diet. There is a formula and diet plan available depending on whether you are overweight or underweight. Your caregiver or nutritionist can help and advise you if necessary. X Avoid X-rays. If you must have dental work or diagnostic tests, tell your dentist or physician that you are pregnant so that extra care can be taken. X-rays should only be taken when the risks of not taking  them outweigh the risk of taking them. If needed, only the minimum amount of radiation should be used. When X-rays are necessary, protective lead shields should be used to cover areas of the body that are not being X-rayed. Y Your baby loves you. Breastfeeding your baby creates a loving and very close bond between the two of you. Give your baby a healthy environment to live in while you are pregnant. Infants and children require constant care and guidance. Their health and safety should be carefully watched at all times. After the baby is born, rest or take a nap when the baby is sleeping. Z Get your ZZZs. Be sure to get plenty of rest. Resting on your side as often as possible, especially on your left side is advised. It provides the best circulation to your baby and helps reduce swelling. Try taking a nap for 30 to 45 minutes in the afternoon when possible. After the baby is born rest or take a nap when the baby is sleeping. Try elevating your feet for that amount of time when possible. It helps the circulation in your legs and helps reduce swelling.  Most information courtesy of the CDC. Document Released: 11/28/2005 Document Revised: 02/20/2012 Document Reviewed: 08/12/2009 [facility] Patient Information 2014 [place], [place]. Call and make an appointment for Prenatal care

## 2013-12-17 NOTE — ED Provider Notes (Signed)
Medical screening examination/treatment/procedure(s) were performed by non-physician practitioner and as supervising physician I was immediately available for consultation/collaboration.  EKG Interpretation   None        Courtney F Horton, MD 12/17/13 1750 

## 2013-12-18 NOTE — Progress Notes (Signed)
[facility] no [facility] carry [facility] Medication.CM Verified patient demographics in EPIC.This CM called [facility] and spoke with the Pharmacist.She reports the only prenatal medication they carry is  Pre plus-Pre Natal-Iron-27 MG- Folic Acid- 1mg .This CM spoke with Junious SilkHannah Merrell PA.Case reviewed from Correction sheet . [facility] Per  PA Konrad DoloresMerrell- / MD Dr Wilkie AyeHorton.No further CM needs.

## 2013-12-26 ENCOUNTER — Inpatient Hospital Stay (HOSPITAL_COMMUNITY): Payer: Medicaid Other

## 2013-12-26 ENCOUNTER — Encounter (HOSPITAL_COMMUNITY): Payer: Self-pay | Admitting: *Deleted

## 2013-12-26 ENCOUNTER — Inpatient Hospital Stay (HOSPITAL_COMMUNITY)
Admission: AD | Admit: 2013-12-26 | Discharge: 2013-12-26 | Disposition: A | Discharge status: Home / Self Care | Payer: Medicaid Other | Source: Ambulatory Visit | Attending: Obstetrics & Gynecology | Admitting: Obstetrics & Gynecology

## 2013-12-26 DIAGNOSIS — B9689 Other specified bacterial agents as the cause of diseases classified elsewhere: Secondary | ICD-10-CM | POA: Insufficient documentation

## 2013-12-26 DIAGNOSIS — O2390 Unspecified genitourinary tract infection in pregnancy, unspecified trimester: Secondary | ICD-10-CM | POA: Insufficient documentation

## 2013-12-26 DIAGNOSIS — A499 Bacterial infection, unspecified: Secondary | ICD-10-CM | POA: Insufficient documentation

## 2013-12-26 DIAGNOSIS — N760 Acute vaginitis: Secondary | ICD-10-CM | POA: Insufficient documentation

## 2013-12-26 DIAGNOSIS — O418X10 Other specified disorders of amniotic fluid and membranes, first trimester, not applicable or unspecified: Secondary | ICD-10-CM

## 2013-12-26 DIAGNOSIS — O4590 Premature separation of placenta, unspecified, unspecified trimester: Secondary | ICD-10-CM

## 2013-12-26 DIAGNOSIS — O208 Other hemorrhage in early pregnancy: Secondary | ICD-10-CM | POA: Insufficient documentation

## 2013-12-26 DIAGNOSIS — R109 Unspecified abdominal pain: Secondary | ICD-10-CM | POA: Insufficient documentation

## 2013-12-26 DIAGNOSIS — Z87891 Personal history of nicotine dependence: Secondary | ICD-10-CM | POA: Insufficient documentation

## 2013-12-26 DIAGNOSIS — O468X1 Other antepartum hemorrhage, first trimester: Secondary | ICD-10-CM

## 2013-12-26 LAB — CBC
HEMATOCRIT: 29.5 % — AB (ref 36.0–46.0)
HEMOGLOBIN: 10 g/dL — AB (ref 12.0–15.0)
MCH: 28.5 pg (ref 26.0–34.0)
MCHC: 33.9 g/dL (ref 30.0–36.0)
MCV: 84 fL (ref 78.0–100.0)
Platelets: 335 10*3/uL (ref 150–400)
RBC: 3.51 MIL/uL — AB (ref 3.87–5.11)
RDW: 15 % (ref 11.5–15.5)
WBC: 6.5 10*3/uL (ref 4.0–10.5)

## 2013-12-26 LAB — URINE MICROSCOPIC-ADD ON

## 2013-12-26 LAB — URINALYSIS, ROUTINE W REFLEX MICROSCOPIC
Bilirubin Urine: NEGATIVE
Glucose, UA: NEGATIVE mg/dL
KETONES UR: 15 mg/dL — AB
LEUKOCYTES UA: NEGATIVE
Nitrite: NEGATIVE
PROTEIN: NEGATIVE mg/dL
Specific Gravity, Urine: 1.025 (ref 1.005–1.030)
UROBILINOGEN UA: 0.2 mg/dL (ref 0.0–1.0)
pH: 6 (ref 5.0–8.0)

## 2013-12-26 LAB — WET PREP, GENITAL
TRICH WET PREP: NONE SEEN
YEAST WET PREP: NONE SEEN

## 2013-12-26 LAB — HCG, QUANTITATIVE, PREGNANCY: HCG, BETA CHAIN, QUANT, S: 39259 m[IU]/mL — AB (ref <5)

## 2013-12-26 LAB — ABO/RH: ABO/RH(D): AB POS

## 2013-12-26 MED ORDER — METRONIDAZOLE 500 MG PO TABS: 500.0000 mg | ORAL_TABLET | Freq: Two times a day (BID) | ORAL | Status: DC

## 2013-12-26 NOTE — MAU Note (Addendum)
Pt reports bleeding today at 17:30. Pt states that the bleeding was a sm. Amt of bright red blood. Pt denies pain. Pt reports cottage cheese discharge and "vaginal itching"

## 2013-12-26 NOTE — MAU Note (Signed)
PT   SAYS SHE WENT TO [facility]  ON 1-5  FROM [facility]-  HAD  POSTIVE UPT.       SAYS SHE STARTED HAVING VAG BLEEDING  AT 545PM- SHE WAS AT HOME- HAD TAKEN NAP- WENT TO [facility] - WHEN SHE VOIDED SHE SAW  RED BLOOD.    HAS PAPER TOWEL - IN TRIAGE - BROWN D/C.    LAST SEX- LAST NIGHT .    MILD  CRAMPS-   DAILY- ON LEFT SIDE.   [facility]

## 2013-12-26 NOTE — Discharge Instructions (Signed)
Prenatal Care [facility]    [facility]  [phone]     Phone: [phone]          [facility]                      [facility]  @[place]     Phone: [phone]  Phone: [phone]         [facility]     Phone: [phone]  Phone: [phone]           [facility] [place]                hone: [phone]  Phone: [phone]         [facility] Dr. Francoise CeoBernard Marshall      Phone: [phone]  Phone: [phone]         [facility]                Phone: [phone]  [facility]   [address]:[phone]    [facility] [place])          Phone: [phone] [facility]   Phone: [phone]  Subchorionic Hematoma A subchorionic hematoma is a gathering of blood between the outer wall of the placenta and the inner wall of the womb (uterus). The placenta is the organ that connects the fetus to the wall of the uterus. The placenta performs the feeding, breathing (oxygen to the fetus), and waste removal (excretory work) of the fetus.  Subchorionic hematoma is the most common abnormality found on a result from ultrasonography done during the first trimester or early second trimester of pregnancy. If there has been little or no vaginal bleeding, early small hematomas usually shrink on their own and do not affect your baby or pregnancy. The blood is gradually absorbed over 1 2 weeks. When bleeding starts later in pregnancy or the hematoma is larger or occurs in an older pregnant woman, the outcome may not be as good. Larger hematomas may get bigger, which increases the chances for miscarriage. Subchorionic hematoma also increases the risk of premature detachment of the placenta from the uterus, preterm (premature) labor, and stillbirth. HOME CARE INSTRUCTIONS   Stay on bed rest if  your health care provider recommends this. Although bed rest will not prevent more bleeding or prevent a miscarriage, your health care provider may recommend bed rest until you are advised otherwise.  Avoid heavy lifting (more than 10 lb [4.5 kg]), exercise, sexual intercourse, or douching as directed by your health care provider.  Keep track of the number of pads you use each day and how soaked (saturated) they are. Write down this information.  Do not use tampons.  Keep all follow-up appointments as directed by your health care provider. Your health care provider may ask you to have follow-up blood tests or ultrasound tests or both. SEEK IMMEDIATE MEDICAL CARE IF:   You have severe cramps in your stomach, back, abdomen, or pelvis.  You have a fever.  You pass large clots or tissue. Save any tissue for your health care provider to look at.  Your bleeding increases or you become lightheaded, feel weak, or have fainting episodes. Document Released: 03/15/2007 Document Revised: 09/18/2013 Document Reviewed: 06/27/2013 [facility] Patient Information 2014 [place], [place].  Pelvic Rest Pelvic rest is sometimes recommended for women  when:   The placenta is partially or completely covering the opening of the cervix (placenta previa).  There is bleeding between the uterine wall and the amniotic sac in the first trimester (subchorionic hemorrhage).  The cervix begins to open without labor starting (incompetent cervix, cervical insufficiency).  The labor is too early (preterm labor). HOME CARE INSTRUCTIONS  Do not have sexual intercourse, stimulation, or an orgasm.  Do not use tampons, douche, or put anything in the vagina.  Do not lift anything over 10 pounds (4.5 kg).  Avoid strenuous activity or straining your pelvic muscles. SEEK MEDICAL CARE IF:  You have any vaginal bleeding during pregnancy. Treat this as a potential emergency.  You have cramping pain felt low in the stomach  (stronger than menstrual cramps).  You notice vaginal discharge (watery, mucus, or bloody).  You have a low, dull backache.  There are regular contractions or uterine tightening. SEEK IMMEDIATE MEDICAL CARE IF: You have vaginal bleeding and have placenta previa.  Document Released: 03/25/2011 Document Revised: 02/20/2012 Document Reviewed: 03/25/2011 [facility] Patient Information 2014 [place], [place].  Pregnancy - First Trimester During sexual intercourse, millions of sperm go into the vagina. Only 1 sperm will penetrate and fertilize the female egg while it is in the Fallopian tube. One week later, the fertilized egg implants into the wall of the uterus. An embryo begins to develop into a baby. At 6 to 8 weeks, the eyes and face are formed and the heartbeat can be seen on ultrasound. At the end of 12 weeks (first trimester), all the baby's organs are formed. Now that you are pregnant, you will want to do everything you can to have a healthy baby. Two of the most important things are to get good prenatal care and follow your caregiver's instructions. Prenatal care is all the medical care you receive before the baby's birth. It is given to prevent, find, and treat problems during the pregnancy and childbirth. PRENATAL EXAMS  During prenatal visits, your weight, blood pressure, and urine are checked. This is done to make sure you are healthy and progressing normally during the pregnancy.  A pregnant woman should gain 25 to 35 pounds during the pregnancy. However, if you are overweight or underweight, your caregiver will advise you regarding your weight.  Your caregiver will ask and answer questions for you.  Blood work, cervical cultures, other necessary tests, and a Pap test are done during your prenatal exams. These tests are done to check on your health and the probable health of your baby. Tests are strongly recommended and done for HIV with your permission. This is the virus that causes AIDS.  These tests are done because medicines can be given to help prevent your baby from being born with this infection should you have been infected without knowing it. Blood work is also used to find out your blood type, previous infections, and follow your blood levels (hemoglobin).  Low hemoglobin (anemia) is common during pregnancy. Iron and vitamins are given to help prevent this. Later in the pregnancy, blood tests for diabetes will be done along with any other tests if any problems develop.  You may need other tests to make sure you and the baby are doing well. CHANGES DURING THE FIRST TRIMESTER  Your body goes through many changes during pregnancy. They vary from person to person. Talk to your caregiver about changes you notice and are concerned about. Changes can include:  Your menstrual period stops.  The egg and sperm carry  the genes that determine what you look like. Genes from you and your partner are forming a baby. The female genes determine whether the baby is a boy or a girl.  Your body increases in girth and you may feel bloated.  Feeling sick to your stomach (nauseous) and throwing up (vomiting). If the vomiting is uncontrollable, call your caregiver.  Your breasts will begin to enlarge and become tender.  Your nipples may stick out more and become darker.  The need to urinate more. Painful urination may mean you have a bladder infection.  Tiring easily.  Loss of appetite.  Cravings for certain kinds of food.  At first, you may gain or lose a couple of pounds.  You may have changes in your emotions from day to day (excited to be pregnant or concerned something may go wrong with the pregnancy and baby).  You may have more vivid and strange dreams. HOME CARE INSTRUCTIONS   It is very important to avoid all smoking, alcohol and non-prescribed drugs during your pregnancy. These affect the formation and growth of the baby. Avoid chemicals while pregnant to ensure the  delivery of a healthy infant.  Start your prenatal visits by the 12th week of pregnancy. They are usually scheduled monthly at first, then more often in the last 2 months before delivery. Keep your caregiver's appointments. Follow your caregiver's instructions regarding medicine use, blood and lab tests, exercise, and diet.  During pregnancy, you are providing food for you and your baby. Eat regular, well-balanced meals. Choose foods such as meat, fish, milk and other low fat dairy products, vegetables, fruits, and whole-grain breads and cereals. Your caregiver will tell you of the ideal weight gain.  You can help morning sickness by keeping soda crackers at the bedside. Eat a couple before arising in the morning. You may want to use the crackers without salt on them.  Eating 4 to 5 small meals rather than 3 large meals a day also may help the nausea and vomiting.  Drinking liquids between meals instead of during meals also seems to help nausea and vomiting.  A physical sexual relationship may be continued throughout pregnancy if there are no other problems. Problems may be early (premature) leaking of amniotic fluid from the membranes, vaginal bleeding, or belly (abdominal) pain.  Exercise regularly if there are no restrictions. Check with your caregiver or physical therapist if you are unsure of the safety of some of your exercises. Greater weight gain will occur in the last 2 trimesters of pregnancy. Exercising will help:  Control your weight.  Keep you in shape.  Prepare you for labor and delivery.  Help you lose your pregnancy weight after you deliver your baby.  Wear a good support or jogging bra for breast tenderness during pregnancy. This may help if worn during sleep too.  Ask when prenatal classes are available. Begin classes when they are offered.  Do not use hot tubs, steam rooms, or saunas.  Wear your seat belt when driving. This protects you and your baby if you are in an  accident.  Avoid raw meat, uncooked cheese, cat litter boxes, and soil used by cats throughout the pregnancy. These carry germs that can cause birth defects in the baby.  The first trimester is a good time to visit your dentist for your dental health. Getting your teeth cleaned is okay. Use a softer toothbrush and brush gently during pregnancy.  Ask for help if you have financial, counseling, or nutritional needs  during pregnancy. Your caregiver will be able to offer counseling for these needs as well as refer you for other special needs.  Do not take any medicines or herbs unless told by your caregiver.  Inform your caregiver if there is any mental or physical domestic violence.  Make a list of emergency phone numbers of family, friends, hospital, and police and fire departments.  Write down your questions. Take them to your prenatal visit.  Do not douche.  Do not cross your legs.  If you have to stand for long periods of time, rotate you feet or take small steps in a circle.  You may have more vaginal secretions that may require a sanitary pad. Do not use tampons or scented sanitary pads. MEDICINES AND DRUG USE IN PREGNANCY  Take prenatal vitamins as directed. The vitamin should contain 1 milligram of folic acid. Keep all vitamins out of reach of children. Only a couple vitamins or tablets containing iron may be fatal to a baby or young child when ingested.  Avoid use of all medicines, including herbs, over-the-counter medicines, not prescribed or suggested by your caregiver. Only take over-the-counter or prescription medicines for pain, discomfort, or fever as directed by your caregiver. Do not use aspirin, ibuprofen, or naproxen unless directed by your caregiver.  Let your caregiver also know about herbs you may be using.  Alcohol is related to a number of birth defects. This includes fetal alcohol syndrome. All alcohol, in any form, should be avoided completely. Smoking will cause  low birth rate and premature babies.  Street or illegal drugs are very harmful to the baby. They are absolutely forbidden. A baby born to an addicted mother will be addicted at birth. The baby will go through the same withdrawal an adult does.  Let your caregiver know about any medicines that you have to take and for what reason you take them. SEEK MEDICAL CARE IF:  You have any concerns or worries during your pregnancy. It is better to call with your questions if you feel they cannot wait, rather than worry about them. SEEK IMMEDIATE MEDICAL CARE IF:   An unexplained oral temperature above 102 F (38.9 C) develops, or as your caregiver suggests.  You have leaking of fluid from the vagina (birth canal). If leaking membranes are suspected, take your temperature and inform your caregiver of this when you call.  There is vaginal spotting or bleeding. Notify your caregiver of the amount and how many pads are used.  You develop a bad smelling vaginal discharge with a change in the color.  You continue to feel sick to your stomach (nauseated) and have no relief from remedies suggested. You vomit blood or coffee ground-like materials.  You lose more than 2 pounds of weight in 1 week.  You gain more than 2 pounds of weight in 1 week and you notice swelling of your face, hands, feet, or legs.  You gain 5 pounds or more in 1 week (even if you do not have swelling of your hands, face, legs, or feet).  You get exposed to [place] measles and have never had them.  You are exposed to fifth disease or chickenpox.  You develop belly (abdominal) pain. Round ligament discomfort is a common non-cancerous (benign) cause of abdominal pain in pregnancy. Your caregiver still must evaluate this.  You develop headache, fever, diarrhea, pain with urination, or shortness of breath.  You fall or are in a car accident or have any kind of trauma.  There is mental or physical violence in your home. Document  Released: 11/22/2001 Document Revised: 08/22/2012 Document Reviewed: 05/26/2009 [facility] Patient Information 2014 [place], [place].

## 2013-12-26 NOTE — MAU Provider Note (Signed)
Chief Complaint: No chief complaint on file.   First Provider Initiated Contact with Patient 12/26/13 2041     SUBJECTIVE HPI: Christy ALMENDAREZ is a 28 y.o. G1P0 at [redacted]w[redacted]d by LMP who presents with pos UPT at [facility] 12/16/13, light vaginal bleeding and cramping today. Pos Vaginal itching. No other testing this pregnancy. No fever, chills, urinary complaints, GI complaints, passage of tissue or clots. No abd pain now.  Past Medical History  Diagnosis Date  . Medical history non-contributory    OB History  Gravida Para Term Preterm AB SAB TAB Ectopic Multiple Living  1             # Outcome Date GA Lbr Len/2nd Weight Sex Delivery Anes PTL Lv  1 CUR              Past Surgical History  Procedure Laterality Date  . Nose surgery  2010    repair   History   Social History  . Marital Status: Single    Spouse Name: N/A    Number of Children: N/A  . Years of Education: N/A   Occupational History  . Not on file.   Social History Main Topics  . Smoking status: Former Smoker -- 1.00 packs/day    Quit date: 12/16/2013  . Smokeless tobacco: Not on file  . Alcohol Use: Yes  . Drug Use: Yes    Special: Marijuana  . Sexual Activity: Yes    Birth Control/ Protection: None   Other Topics Concern  . Not on file   Social History Narrative  . No narrative on file   No current facility-administered medications on file prior to encounter.   No current outpatient prescriptions on file prior to encounter.   No Known Allergies  ROS: Pertinent items in HPI  OBJECTIVE Blood pressure 101/61, pulse 89, temperature 98.4 F (36.9 C), temperature source Oral, resp. rate 20, height 5\' 7"  (1.702 m), weight 67.586 kg (149 lb), last menstrual period 11/08/2013. GENERAL: Well-developed, well-nourished female in no acute distress.  HEENT: Normocephalic HEART: normal rate RESP: normal effort ABDOMEN: Soft, non-tender EXTREMITIES: Nontender, no edema NEURO: Alert and oriented SPECULUM EXAM:  NEFG, small amount of malodorous blood noted, cervix clean BIMANUAL: cervix closed; uterus slightly enlarged, no adnexal tenderness or masses. No CMT.   LAB RESULTS Results for orders placed during the hospital encounter of 12/26/13 (from the past 24 hour(s))  CBC     Status: Abnormal   Collection Time    12/26/13  7:43 PM      Result Value Range   WBC 6.5  4.0 - 10.5 K/uL   RBC 3.51 (*) 3.87 - 5.11 MIL/uL   Hemoglobin 10.0 (*) 12.0 - 15.0 g/dL   HCT 16.1 (*) 09.6 - 04.5 %   MCV 84.0  78.0 - 100.0 fL   MCH 28.5  26.0 - 34.0 pg   MCHC 33.9  30.0 - 36.0 g/dL   RDW 40.9  81.1 - 91.4 %   Platelets 335  150 - 400 K/uL  HCG, QUANTITATIVE, PREGNANCY     Status: Abnormal   Collection Time    12/26/13  7:43 PM      Result Value Range   hCG, Beta Chain, Quant, S 39259 (*) <5 mIU/mL  ABO/RH     Status: None   Collection Time    12/26/13  7:43 PM      Result Value Range   ABO/RH(D) AB POS    WET PREP, GENITAL  Status: Abnormal   Collection Time    12/26/13  8:55 PM      Result Value Range   Yeast Wet Prep HPF POC NONE SEEN  NONE SEEN   Trich, Wet Prep NONE SEEN  NONE SEEN   Clue Cells Wet Prep HPF POC FEW (*) NONE SEEN   WBC, Wet Prep HPF POC FEW (*) NONE SEEN    IMAGING [place] Ob Comp Less 14 Wks  12/26/2013   CLINICAL DATA:  Bleeding in early pregnancy  EXAM: OBSTETRIC <14 WK ULTRASOUND  TECHNIQUE: Transabdominal ultrasound was performed for evaluation of the gestation as well as the maternal uterus and adnexal regions.  COMPARISON:  03/05/2013  FINDINGS: Intrauterine gestational sac: Present and normal in shape. There is a subchorionic hemorrhage which measures approximately 1.6 cm in diameter. Despite the size, it encompasses only a small portion of the gestational sac circumference, mainly around the fundic contour.  Yolk sac:  Present  Embryo:  Present  Cardiac Activity: Present  Heart Rate: 123 bpm  CRL:   6.4  mm   6 w 4 d                  US EDC: August 17, 2014  Maternal  uterus/adnexae: Symmetric ovarian volume. The ovaries have a normal appearance. No adnexal mass. There is free pelvic fluid which is small in volume in simple in appearance. Other than the gestation, the uterus is unremarkable.  IMPRESSION: 1. Single, living intrauterine gestation -estimated age 40 weeks 4 days by crown-rump length. 2. Small subchorionic hemorrhage, as above.   Electronically Signed   By: Tiburcio PeaJonathan  Watts M.D.   On: 12/26/2013 21:30    MAU COURSE  ASSESSMENT 1. Subchorionic hemorrhage in first trimester   2. BV (bacterial vaginosis)     PLAN Discharge home in stable condition. Pelvic rest x1 week. List of providers given. Pregnancy verification letter given. Follow-up Information   Follow up with [facility].      [facility]. (As needed in emergencies)    Contact information:   [address] [id number] [facility] [place] [id number] [phone]         Follow-up Information   Follow up with [facility].      [facility] with [facility] [place]. (As needed in emergencies)    Contact information:   [address] [id number] [place] [address] [phone]       Medication List         acetaminophen 500 MG tablet  Commonly known as:  TYLENOL  Take 1,000 mg by mouth every 6 (six) hours as needed for headache.     metroNIDAZOLE 500 MG tablet  Commonly known as:  FLAGYL  Take 1 tablet (500 mg total) by mouth 2 (two) times daily.     PRENATAL COMPLETE 14-0.4 MG Tabs  Take 1 tablet by mouth once.       [place] Markel [place], [place] 12/26/2013  10:29 PM

## 2013-12-27 LAB — GC/CHLAMYDIA PROBE AMP
CT PROBE, AMP APTIMA: NEGATIVE
GC PROBE AMP APTIMA: NEGATIVE

## 2014-01-02 ENCOUNTER — Inpatient Hospital Stay (HOSPITAL_COMMUNITY)
Admission: AD | Admit: 2014-01-02 | Discharge: 2014-01-02 | Disposition: A | Discharge status: Home / Self Care | Payer: Medicaid Other | Source: Ambulatory Visit | Attending: Obstetrics & Gynecology | Admitting: Obstetrics & Gynecology

## 2014-01-02 ENCOUNTER — Inpatient Hospital Stay (HOSPITAL_COMMUNITY): Payer: Medicaid Other

## 2014-01-02 ENCOUNTER — Encounter (HOSPITAL_COMMUNITY): Payer: Self-pay | Admitting: General Practice

## 2014-01-02 DIAGNOSIS — Y92009 Unspecified place in unspecified non-institutional (private) residence as the place of occurrence of the external cause: Secondary | ICD-10-CM | POA: Insufficient documentation

## 2014-01-02 DIAGNOSIS — Z87891 Personal history of nicotine dependence: Secondary | ICD-10-CM | POA: Insufficient documentation

## 2014-01-02 DIAGNOSIS — O99891 Other specified diseases and conditions complicating pregnancy: Secondary | ICD-10-CM | POA: Insufficient documentation

## 2014-01-02 DIAGNOSIS — S1093XA Contusion of unspecified part of neck, initial encounter: Secondary | ICD-10-CM

## 2014-01-02 DIAGNOSIS — S0083XA Contusion of other part of head, initial encounter: Secondary | ICD-10-CM

## 2014-01-02 DIAGNOSIS — O9989 Other specified diseases and conditions complicating pregnancy, childbirth and the puerperium: Principal | ICD-10-CM

## 2014-01-02 DIAGNOSIS — S0003XA Contusion of scalp, initial encounter: Secondary | ICD-10-CM | POA: Insufficient documentation

## 2014-01-02 MED ORDER — OXYCODONE-ACETAMINOPHEN 5-325 MG PO TABS
2.0000 | ORAL_TABLET | Freq: Once | ORAL | Status: AC
  Administered 2014-01-02: 2 via ORAL
  Filled 2014-01-02: qty 2

## 2014-01-02 MED ORDER — OXYCODONE-ACETAMINOPHEN 5-325 MG/5ML PO SOLN: 5.0000 mL | ORAL | Status: DC | PRN
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
## 2014-01-02 NOTE — MAU Note (Signed)
Patient states she has had increasing swelling in the left side of her face for 4 days. Is unable to open her mouth enough to eat and has not eaten for the past 4 days. States very painful. Denies bleeding or discharge, has an Rx for Flagyl but has not been able to take it. Has nausea, no vomiting. No flu like symptoms.
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
## 2014-01-08 ENCOUNTER — Encounter: Payer: Self-pay | Admitting: Advanced Practice Midwife

## 2014-01-10 NOTE — MAU Provider Note (Signed)
Attestation of Attending Supervision of Advanced Practitioner (CNM/NP): Evaluation and management procedures were performed by the Advanced Practitioner under my supervision and collaboration. I have reviewed the Advanced Practitioner's note and chart, and I agree with the management and plan.  Trasean Delima H. 5:00 PM

## 2014-01-16 ENCOUNTER — Other Ambulatory Visit (HOSPITAL_COMMUNITY): Payer: Self-pay | Admitting: Nurse Practitioner

## 2014-01-16 DIAGNOSIS — Z3682 Encounter for antenatal screening for nuchal translucency: Secondary | ICD-10-CM

## 2014-01-16 LAB — CYTOLOGY - PAP: Pap Smear: NEGATIVE

## 2014-01-26 LAB — OB RESULTS CONSOLE RUBELLA ANTIBODY, IGM: Rubella: IMMUNE

## 2014-01-26 LAB — OB RESULTS CONSOLE HEPATITIS B SURFACE ANTIGEN: Hepatitis B Surface Ag: NEGATIVE

## 2014-01-26 LAB — OB RESULTS CONSOLE GC/CHLAMYDIA
CHLAMYDIA, DNA PROBE: NEGATIVE
Gonorrhea: NEGATIVE

## 2014-01-26 LAB — OB RESULTS CONSOLE VARICELLA ZOSTER ANTIBODY, IGG: Varicella: IMMUNE

## 2014-01-26 LAB — OB RESULTS CONSOLE ANTIBODY SCREEN: Antibody Screen: NEGATIVE

## 2014-01-26 LAB — OB RESULTS CONSOLE RPR: RPR: NONREACTIVE

## 2014-01-26 LAB — OB RESULTS CONSOLE HIV ANTIBODY (ROUTINE TESTING): HIV: NONREACTIVE

## 2014-01-30 ENCOUNTER — Encounter: Payer: Self-pay | Admitting: Family Medicine

## 2014-02-07 ENCOUNTER — Other Ambulatory Visit: Payer: Self-pay

## 2014-02-07 ENCOUNTER — Ambulatory Visit (HOSPITAL_COMMUNITY)
Admission: RE | Admit: 2014-02-07 | Discharge: 2014-02-07 | Disposition: A | Discharge status: Home / Self Care | Payer: Medicaid Other | Source: Ambulatory Visit | Attending: Nurse Practitioner | Admitting: Nurse Practitioner

## 2014-02-07 DIAGNOSIS — O3510X0 Maternal care for (suspected) chromosomal abnormality in fetus, unspecified, not applicable or unspecified: Secondary | ICD-10-CM | POA: Insufficient documentation

## 2014-02-07 DIAGNOSIS — Z3682 Encounter for antenatal screening for nuchal translucency: Secondary | ICD-10-CM

## 2014-02-07 DIAGNOSIS — Z3689 Encounter for other specified antenatal screening: Secondary | ICD-10-CM | POA: Insufficient documentation

## 2014-02-07 DIAGNOSIS — O351XX0 Maternal care for (suspected) chromosomal abnormality in fetus, not applicable or unspecified: Secondary | ICD-10-CM | POA: Insufficient documentation

## 2014-02-11 ENCOUNTER — Other Ambulatory Visit: Payer: Self-pay | Admitting: Family Medicine

## 2014-02-11 DIAGNOSIS — N63 Unspecified lump in unspecified breast: Secondary | ICD-10-CM

## 2014-02-17 ENCOUNTER — Ambulatory Visit
Admission: RE | Admit: 2014-02-17 | Discharge: 2014-02-17 | Disposition: A | Discharge status: Home / Self Care | Payer: Medicaid Other | Source: Ambulatory Visit | Attending: Family Medicine | Admitting: Family Medicine

## 2014-02-17 DIAGNOSIS — N63 Unspecified lump in unspecified breast: Secondary | ICD-10-CM

## 2014-02-18 ENCOUNTER — Other Ambulatory Visit (HOSPITAL_COMMUNITY): Payer: Self-pay | Admitting: Nurse Practitioner

## 2014-02-18 DIAGNOSIS — Z3689 Encounter for other specified antenatal screening: Secondary | ICD-10-CM

## 2014-03-14 ENCOUNTER — Inpatient Hospital Stay (HOSPITAL_COMMUNITY)
Admission: AD | Admit: 2014-03-14 | Discharge: 2014-03-15 | Disposition: A | Discharge status: Home / Self Care | Payer: Medicaid Other | Source: Ambulatory Visit | Attending: Obstetrics and Gynecology | Admitting: Obstetrics and Gynecology

## 2014-03-14 ENCOUNTER — Encounter (HOSPITAL_COMMUNITY): Payer: Self-pay | Admitting: *Deleted

## 2014-03-14 DIAGNOSIS — N949 Unspecified condition associated with female genital organs and menstrual cycle: Secondary | ICD-10-CM

## 2014-03-14 DIAGNOSIS — O99891 Other specified diseases and conditions complicating pregnancy: Secondary | ICD-10-CM | POA: Insufficient documentation

## 2014-03-14 DIAGNOSIS — R109 Unspecified abdominal pain: Secondary | ICD-10-CM | POA: Insufficient documentation

## 2014-03-14 DIAGNOSIS — M545 Low back pain, unspecified: Secondary | ICD-10-CM | POA: Insufficient documentation

## 2014-03-14 DIAGNOSIS — O9989 Other specified diseases and conditions complicating pregnancy, childbirth and the puerperium: Principal | ICD-10-CM

## 2014-03-14 DIAGNOSIS — Z87891 Personal history of nicotine dependence: Secondary | ICD-10-CM | POA: Insufficient documentation

## 2014-03-14 LAB — URINE MICROSCOPIC-ADD ON

## 2014-03-14 LAB — URINALYSIS, ROUTINE W REFLEX MICROSCOPIC
Bilirubin Urine: NEGATIVE
Glucose, UA: NEGATIVE mg/dL
Hgb urine dipstick: NEGATIVE
Ketones, ur: NEGATIVE mg/dL
NITRITE: NEGATIVE
PH: 6 (ref 5.0–8.0)
Protein, ur: NEGATIVE mg/dL
Urobilinogen, UA: 1 mg/dL (ref 0.0–1.0)

## 2014-03-14 LAB — WET PREP, GENITAL
TRICH WET PREP: NONE SEEN
YEAST WET PREP: NONE SEEN

## 2014-03-14 NOTE — MAU Note (Signed)
I woke up about 1700 and was hurting in my lower back and stomach on both sides. Sharp pain that is constant. Having a lot of white d/c. Feel a lot of pelvic pressure

## 2014-03-14 NOTE — MAU Provider Note (Signed)
History     CSN: [id number]  Arrival date and time: 03/14/14 2250   First Provider Initiated Contact with Patient 03/14/14 2317      Chief Complaint  Patient presents with  . Abdominal Pain  . Back Pain   Abdominal Pain  Back Pain Associated symptoms include abdominal pain.    Thurston HoleDiamond M Learn is a 28 y.o. G1P0 at 5962w0d who presents today with lower abdominal and lower back pain. She states that the pain started this afternoon, and she rates the pain 8/10. She has not taken anything for it at this time. She denies any fever or urinary sx. She does report increased vaginal discharge, and had one episode of spotting after intercourse earlier today.   Past Medical History  Diagnosis Date  . Medical history non-contributory     Past Surgical History  Procedure Laterality Date  . Nose surgery  2010    repair  . No past surgeries      Family History  Problem Relation Age of Onset  . Diabetes Paternal Uncle   . Diabetes Paternal Grandmother   . Hearing loss Neg Hx     History  Substance Use Topics  . Smoking status: Former Smoker -- 1.00 packs/day    Quit date: 12/16/2013  . Smokeless tobacco: Not on file  . Alcohol Use: Yes    Allergies: No Known Allergies  Prescriptions prior to admission  Medication Sig Dispense Refill  . acetaminophen (TYLENOL) 500 MG tablet Take 1,000 mg by mouth every 6 (six) hours as needed for headache.      . Prenatal Vit-Fe Fumarate-FA (PRENATAL COMPLETE) 14-0.4 MG TABS Take 1 tablet by mouth daily.       . metroNIDAZOLE (FLAGYL) 500 MG tablet Take 1 tablet (500 mg total) by mouth 2 (two) times daily.  14 tablet  0  . oxyCODONE-acetaminophen (ROXICET) 5-325 MG/5ML solution Take 5-10 mLs by mouth every 4 (four) hours as needed for severe pain.  150 mL  0    Review of Systems  Gastrointestinal: Positive for abdominal pain.  Musculoskeletal: Positive for back pain.   Physical Exam   Blood pressure 117/65, pulse 87, temperature 98.5 F  (36.9 C), resp. rate 20, height 5\' 6"  (1.676 m), weight 68.493 kg (151 lb), last menstrual period 11/08/2013.  Physical Exam  Nursing note and vitals reviewed. Constitutional: She is oriented to person, place, and time. She appears well-developed and well-nourished. No distress.  Cardiovascular: Normal rate.   Respiratory: Effort normal.  GI: Soft. There is no tenderness. There is no rebound and no guarding.  Genitourinary:  No CVA tenderness  External: no lesion Vagina: small amount of white discharge. No blood seen  Cervix: pink, smooth, no CMT, closed/thick/high Uterus: AGA, FHT with doppler    Neurological: She is alert and oriented to person, place, and time.  Skin: Skin is warm and dry.  Psychiatric: She has a normal mood and affect.    MAU Course  Procedures   Results for orders placed during the hospital encounter of 03/14/14 (from the past 24 hour(s))  WET PREP, GENITAL     Status: Abnormal   Collection Time    03/14/14 11:20 PM      Result Value Ref Range   Yeast Wet Prep HPF POC NONE SEEN  NONE SEEN   Trich, Wet Prep NONE SEEN  NONE SEEN   Clue Cells Wet Prep HPF POC FEW (*) NONE SEEN   WBC, Wet Prep HPF POC MODERATE (*)  NONE SEEN  URINALYSIS, ROUTINE W REFLEX MICROSCOPIC     Status: Abnormal   Collection Time    03/14/14 11:25 PM      Result Value Ref Range   Color, Urine YELLOW  YELLOW   APPearance CLEAR  CLEAR   Specific Gravity, Urine >1.030 (*) 1.005 - 1.030   pH 6.0  5.0 - 8.0   Glucose, UA NEGATIVE  NEGATIVE mg/dL   Hgb urine dipstick NEGATIVE  NEGATIVE   Bilirubin Urine NEGATIVE  NEGATIVE   Ketones, ur NEGATIVE  NEGATIVE mg/dL   Protein, ur NEGATIVE  NEGATIVE mg/dL   Urobilinogen, UA 1.0  0.0 - 1.0 mg/dL   Nitrite NEGATIVE  NEGATIVE   Leukocytes, UA SMALL (*) NEGATIVE  URINE MICROSCOPIC-ADD ON     Status: Abnormal   Collection Time    03/14/14 11:25 PM      Result Value Ref Range   Squamous Epithelial / LPF FEW (*) RARE   WBC, UA 3-6  <3  WBC/hpf   RBC / HPF 0-2  <3 RBC/hpf   Bacteria, UA FEW (*) RARE   Urine-Other MUCOUS PRESENT      Assessment and Plan   1. Round ligament pain    Will send urine culture 2nd trimester danger signs reviewed FU with the health department as planned Return to MAU as needed  Follow-up Information   Follow up with [facility] [place]. (As scheduled)    Contact information:   [address] [place] [address] [phone]       Tawnya Crook 03/14/2014, 11:24 PM

## 2014-03-15 DIAGNOSIS — N949 Unspecified condition associated with female genital organs and menstrual cycle: Secondary | ICD-10-CM

## 2014-03-15 LAB — GC/CHLAMYDIA PROBE AMP
CT Probe RNA: NEGATIVE
GC PROBE AMP APTIMA: NEGATIVE

## 2014-03-15 NOTE — Progress Notes (Signed)
Heather Hogan CNM in earlier to discuss test results and d/c plan. Written and verbal d/c instructions given and understanding voiced 

## 2014-03-15 NOTE — Discharge Instructions (Signed)
Second Trimester of Pregnancy The second trimester is from week 13 through week 28, months 4 through 6. The second trimester is often a time when you feel your best. Your body has also adjusted to being pregnant, and you begin to feel better physically. Usually, morning sickness has lessened or quit completely, you may have more energy, and you may have an increase in appetite. The second trimester is also a time when the fetus is growing rapidly. At the end of the sixth month, the fetus is about 9 inches long and weighs about 1 pounds. You will likely begin to feel the baby move (quickening) between 18 and 20 weeks of the pregnancy. BODY CHANGES Your body goes through many changes during pregnancy. The changes vary from woman to woman.   Your weight will continue to increase. You will notice your lower abdomen bulging out.  You may begin to get stretch marks on your hips, abdomen, and breasts.  You may develop headaches that can be relieved by medicines approved by your caregiver.  You may urinate more often because the fetus is pressing on your bladder.  You may develop or continue to have heartburn as a result of your pregnancy.  You may develop constipation because certain hormones are causing the muscles that push waste through your intestines to slow down.  You may develop hemorrhoids or swollen, bulging veins (varicose veins).  You may have back pain because of the weight gain and pregnancy hormones relaxing your joints between the bones in your pelvis and as a result of a shift in weight and the muscles that support your balance.  Your breasts will continue to grow and be tender.  Your gums may bleed and may be sensitive to brushing and flossing.  Dark spots or blotches (chloasma, mask of pregnancy) may develop on your face. This will likely fade after the baby is born.  A dark line from your belly button to the pubic area (linea nigra) may appear. This will likely fade after the  baby is born. WHAT TO EXPECT AT YOUR PRENATAL VISITS During a routine prenatal visit:  You will be weighed to make sure you and the fetus are growing normally.  Your blood pressure will be taken.  Your abdomen will be measured to track your baby's growth.  The fetal heartbeat will be listened to.  Any test results from the previous visit will be discussed. Your caregiver may ask you:  How you are feeling.  If you are feeling the baby move.  If you have had any abnormal symptoms, such as leaking fluid, bleeding, severe headaches, or abdominal cramping.  If you have any questions. Other tests that may be performed during your second trimester include:  Blood tests that check for:  Low iron levels (anemia).  Gestational diabetes (between 24 and 28 weeks).  Rh antibodies.  Urine tests to check for infections, diabetes, or protein in the urine.  An ultrasound to confirm the proper growth and development of the baby.  An amniocentesis to check for possible genetic problems.  Fetal screens for spina bifida and Down syndrome. HOME CARE INSTRUCTIONS   Avoid all smoking, herbs, alcohol, and unprescribed drugs. These chemicals affect the formation and growth of the baby.  Follow your caregiver's instructions regarding medicine use. There are medicines that are either safe or unsafe to take during pregnancy.  Exercise only as directed by your caregiver. Experiencing uterine cramps is a good sign to stop exercising.  Continue to eat regular,   healthy meals.  Wear a good support bra for breast tenderness.  Do not use hot tubs, steam rooms, or saunas.  Wear your seat belt at all times when driving.  Avoid raw meat, uncooked cheese, cat litter boxes, and soil used by cats. These carry germs that can cause birth defects in the baby.  Take your prenatal vitamins.  Try taking a stool softener (if your caregiver approves) if you develop constipation. Eat more high-fiber foods,  such as fresh vegetables or fruit and whole grains. Drink plenty of fluids to keep your urine clear or pale yellow.  Take warm sitz baths to soothe any pain or discomfort caused by hemorrhoids. Use hemorrhoid cream if your caregiver approves.  If you develop varicose veins, wear support hose. Elevate your feet for 15 minutes, 3 4 times a day. Limit salt in your diet.  Avoid heavy lifting, wear low heel shoes, and practice good posture.  Rest with your legs elevated if you have leg cramps or low back pain.  Visit your dentist if you have not gone yet during your pregnancy. Use a soft toothbrush to brush your teeth and be gentle when you floss.  A sexual relationship may be continued unless your caregiver directs you otherwise.  Continue to go to all your prenatal visits as directed by your caregiver. SEEK MEDICAL CARE IF:   You have dizziness.  You have mild pelvic cramps, pelvic pressure, or nagging pain in the abdominal area.  You have persistent nausea, vomiting, or diarrhea.  You have a bad smelling vaginal discharge.  You have pain with urination. SEEK IMMEDIATE MEDICAL CARE IF:   You have a fever.  You are leaking fluid from your vagina.  You have spotting or bleeding from your vagina.  You have severe abdominal cramping or pain.  You have rapid weight gain or loss.  You have shortness of breath with chest pain.  You notice sudden or extreme swelling of your face, hands, ankles, feet, or legs.  You have not felt your baby move in over an hour.  You have severe headaches that do not go away with medicine.  You have vision changes. Document Released: 11/22/2001 Document Revised: 07/31/2013 Document Reviewed: 01/29/2013 Exit[employer] Patient Information 2014 [employer].  

## 2014-03-16 LAB — URINE CULTURE

## 2014-03-21 NOTE — MAU Provider Note (Signed)
Attestation of Attending Supervision of Advanced Practitioner: Evaluation and management procedures were performed by the PA/NP/CNM/OB Fellow under my supervision/collaboration. Chart reviewed and agree with management and plan.  Malan Werk V Colten Desroches 03/21/2014 7:31 AM     Attestation of Attending Supervision of Advanced Practitioner: Evaluation and management procedures were performed by the PA/NP/CNM/OB Fellow under my supervision/collaboration. Chart reviewed and agree with management and plan.  Tilda Burrow 03/21/2014 7:31 AM

## 2014-03-25 ENCOUNTER — Ambulatory Visit (HOSPITAL_COMMUNITY)
Admission: RE | Admit: 2014-03-25 | Discharge: 2014-03-25 | Disposition: A | Discharge status: Home / Self Care | Payer: Medicaid Other | Source: Ambulatory Visit | Attending: Nurse Practitioner | Admitting: Nurse Practitioner

## 2014-03-25 DIAGNOSIS — Z3689 Encounter for other specified antenatal screening: Secondary | ICD-10-CM | POA: Insufficient documentation

## 2014-03-27 ENCOUNTER — Other Ambulatory Visit (HOSPITAL_COMMUNITY): Payer: Self-pay | Admitting: Nurse Practitioner

## 2014-03-27 DIAGNOSIS — Z0489 Encounter for examination and observation for other specified reasons: Secondary | ICD-10-CM

## 2014-03-27 DIAGNOSIS — IMO0002 Reserved for concepts with insufficient information to code with codable children: Secondary | ICD-10-CM

## 2014-04-18 ENCOUNTER — Other Ambulatory Visit (HOSPITAL_COMMUNITY): Payer: Self-pay | Admitting: Nurse Practitioner

## 2014-04-18 ENCOUNTER — Other Ambulatory Visit: Payer: Self-pay

## 2014-04-18 ENCOUNTER — Ambulatory Visit (HOSPITAL_COMMUNITY)
Admission: RE | Admit: 2014-04-18 | Discharge: 2014-04-18 | Disposition: A | Discharge status: Home / Self Care | Payer: Medicaid Other | Source: Ambulatory Visit

## 2014-04-18 ENCOUNTER — Other Ambulatory Visit (HOSPITAL_COMMUNITY): Payer: Self-pay | Admitting: Maternal and Fetal Medicine

## 2014-04-18 ENCOUNTER — Ambulatory Visit (HOSPITAL_COMMUNITY)
Admission: RE | Admit: 2014-04-18 | Discharge: 2014-04-18 | Disposition: A | Discharge status: Home / Self Care | Payer: Medicaid Other | Source: Ambulatory Visit | Attending: Nurse Practitioner | Admitting: Nurse Practitioner

## 2014-04-18 ENCOUNTER — Ambulatory Visit (HOSPITAL_COMMUNITY): Payer: Medicaid Other

## 2014-04-18 DIAGNOSIS — O358XX0 Maternal care for other (suspected) fetal abnormality and damage, not applicable or unspecified: Secondary | ICD-10-CM

## 2014-04-18 DIAGNOSIS — Z0489 Encounter for examination and observation for other specified reasons: Secondary | ICD-10-CM

## 2014-04-18 DIAGNOSIS — Z1389 Encounter for screening for other disorder: Secondary | ICD-10-CM | POA: Insufficient documentation

## 2014-04-18 DIAGNOSIS — Z363 Encounter for antenatal screening for malformations: Secondary | ICD-10-CM | POA: Insufficient documentation

## 2014-04-18 DIAGNOSIS — IMO0002 Reserved for concepts with insufficient information to code with codable children: Secondary | ICD-10-CM

## 2014-04-21 ENCOUNTER — Encounter (HOSPITAL_COMMUNITY): Payer: Self-pay | Admitting: *Deleted

## 2014-04-21 ENCOUNTER — Telehealth (HOSPITAL_COMMUNITY): Payer: Self-pay | Admitting: MS"

## 2014-04-21 ENCOUNTER — Other Ambulatory Visit (HOSPITAL_COMMUNITY): Payer: Self-pay | Admitting: Maternal and Fetal Medicine

## 2014-04-21 DIAGNOSIS — O26879 Cervical shortening, unspecified trimester: Secondary | ICD-10-CM

## 2014-04-21 NOTE — Progress Notes (Signed)
Prescription for Prometrium 200mg  per vagina QHS, #30 with 5 refills called in to [facility].

## 2014-04-21 NOTE — Telephone Encounter (Signed)
Ms. Christy [facility] returned a call from this morning. I attempted to call patient, but her voicemail box was full. Wanted to review information from Friday's visit. Patient stated that she is doing OK. Discussed with the patient that fetal MRI is better to be performed at later gestation, and will be scheduled closer to approximately [redacted] weeks gestation. Patient verbalized her understanding of this. She stated that she also called her primary OB provider and that the ultrasound report from Friday discussed a follow-up ultrasound to reassess her cervix. Confirmed that this was the case. Ultrasound is scheduled for Thursday, 04/24/14 at 3:45 pm for cervical length assessment. She stated that she has a follow-up appointment with her OB today at 1:45 pm given that she recently has had discharge. She also inquired about prescription for progesterone. [facility] [place]. Stated that our office will call in prescription for her this morning. She did not have additional questions at this time. She was encouraged to call back with questions or concerns.   Helyn AppKaren Louise Ech Aleria Maheu 04/21/2014 12:00 PM

## 2014-04-21 NOTE — Progress Notes (Signed)
Genetic Counseling  High-Risk Gestation Note  Appointment Date:  04/18/2014 Referred By: Jolaine Click, NP Date of Birth:  [birth year]    Pregnancy History: G1P0 Estimated Date of Delivery: 08/15/14 Estimated Gestational Age: [redacted]w[redacted]d Attending: Renella Cunas, MD   I met with Ms. Christy [facility] for genetic counseling because of abnormal ultrasound findings.  We began by reviewing the ultrasound in detail. 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 The remaining visualized fetal anatomy appeared normal. Complete ultrasound results reported separately.   We discussed that ventriculomegaly refers to an increased measurement of the lateral ventricles in the brain greater than 10 mm. Possible causes for mild ventriculomegaly include overproduction of cerebrospinal fluid, a blockage in a duct causing the fluid to accumulate, an intrauterine infection, or a variation of normal. We discussed that once ventriculomegaly is identified in pregnancy, follow-up ultrasounds are offered to assess for progression of ventriculomegaly. Following delivery, post-natal studies may be required in order to track progress and assess for any other differences in brain anatomy. It was discussed that surgical intervention may be required in some cases of ventriculomegaly. Most cases of isolated mild ventriculomegaly do not have an identified cause or associated condition, tend to regress with time, and have no resulting health or learning problems. However, it is possible that this finding may be seen in association with other ultrasound differences or a genetic condition. There is also a slightly increased chance of developmental delays. We discussed that the presence of ventriculomegaly is associated with an increased risk for aneuploidy.  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 We discussed that holoprosencephaly (HPE) is a structural anomaly of the brain in which there is failed or incomplete separation of the forebrain during the third to fourth week of pregnancy. Classic HPE encompasses a continuum of brain malformations including: alobar (no separation of the cerebral hemispheres), semilobar (left and right frontal and parietal lobes are fused and the interhemispheric fissure is only present posteriorly), and lobar (most of the cerebral hemispheres are separated but the most rostral aspect remains fused).  HPE is accompanied by a spectrum of characteristic craniofacial anomalies in approximately 80% of individuals with HPE, including cyclopia or ocular hypotelorism, bilateral cleft lip/palate, and abnormal nose or proboscis.  We discussed that most children with HPE have a variety of health problems which may include: feeding difficulties, seizures, intellectual disability/developmental delay, pituitary dysfunction, short stature, sleep disorders, and aspiration pneumonia.We discussed that the prognosis for the child depends largely upon the underlying etiology and the severity of the HPE. Septooptic dysplasia describes any combination of optic nerve hypoplasia, pituitary glad hypoplasia, and midline abnormalities of the brain, including absence of the corpus callosum and septum pellucidum. We reviewed the availability of a fetal MRI to help delineate the fetal brain findings.   We discussed the various causes for holoprosencephaly including: an environmental (alcohol and maternal diabetes mellitus), multifactorial, or genetic etiology. Ms. Long reported no teratogenic exposures during the pregnancy. Regarding genetic etiologies, we discussed that these can be further subdivided into syndromic and nonsyndromic categories.  Syndromic causes  include many single gene and chromosomal aberrations.  The single gene category includes both dominant (Pallister-Hall) and recessive conditions (Meckel syndrome and SLOS), while the chromosome differences include a variety of deletions, duplications, trisomies, and triploidy.  We reviewed that the  nonsyndromic single gene causes of HPE are mostly inherited in an autosomal dominant fashion.  More than 12 genes are currently known to cause nonsyndromic HPE.  We discussed that these gene mutations are associated with phenotypic variability.  She was counseled that in the case that HPE is present, the risk of recurrence and to some degree, the prognosis, depend upon the underlying etiology.     We discussed that approximately 25-50% of fetuses with HPE have a chromosome difference (specifically trisomy 54).  We reviewed chromosomes, nondisjunction, and the features and prognosis of various conditions including Down syndrome, trisomy 61, and trisomy 84. Ms. Ellwood previously had first trimester screening, which reduced the risks for fetal Down syndrome (1 in 909 to <1 in 10,000) and trisomy 18/13 (1 in 1,655 to <1 in 10,000). We reviewed that screening adjusts the a priori risk to provide a pregnancy specific risk assessment. It does not diagnose or rule out these conditions. Additionally, it does not assess for all chromosome or genetic conditions.  Ms. Purington was counseled regarding the additional screening option of noninvasive prenatal screening (NIPS)/cell free DNA testing. We reviewed the methodology of this screen, the conditions for which it screens and the detection rates for each. She understands that NIPS does not diagnose or rule out these conditions, nor does it assess for all chromosome or genetic conditions. We discussed the diagnostic testing option of amniocentesis. It was discussed that not all birth defects can be identified with these procedures. A risk of 1 in 300-500 was given for amniocentesis,  the primary complication being spontaneous pregnancy loss or preterm delivery. We discussed that in addition to fetal karyotype, a chromosomal microarray could be performed from amniocentesis. A chromosomal microarray is a DNA analysis that looks for changes in copy number of genetic material. A control sample of DNA is used for comparison at thousands of loci across the genome. Microarray analysis allows for the detection of genetic deletions and duplications that are 025 times smaller than those identified by routine chromosome analysis.  Microarray analysis is able to detect deletions or duplications of the tested regions, thus it would identify aneuploidy. It would, however, not identify a structural rearrangement or smaller deletions, duplications or point mutations. In some cases, testing for single gene conditions may be available from amniocentesis. However, the majority of single gene conditions are not tested for via amniocentesis unless family history or ultrasound. We discussed the availability of both prenatal and postnatal single gene testing.  We discussed the option of a postnatal evaluation by a medical geneticist to determine whether specific gene testing for either syndromic or nonsyndromic forms of HPE should be ordered, in the case that HPE is present.     After careful consideration, Ms. Lopata elected to proceed with noninvasive prenatal screening (NIPS). She understands the limitations of this test regarding the conditions for which it can screen. She declined amniocentesis today but indicated that she is further considering this testing option. Follow-up ultrasound was scheduled for the patient. She also expressed interest in pursuing fetal MRI, which will be scheduled for a later gestation.   Given the nature of today's discussion and the patient's expressed desire to leave, detailed family history was not obtained. She reported that there are no known birth defects or genetic conditions  in her family or in the family of the father of the pregnancy. Without further information regarding the provided family history, an accurate genetic risk cannot be calculated. Further genetic counseling is warranted if more  information is obtained.  Ms. [facility] denied exposure to environmental toxins or chemical agents. She denied the use of alcohol, tobacco or street drugs. She denied significant viral illnesses during the course of her pregnancy. Her medical and surgical histories were noncontributory.   I counseled Ms. Rosanna Randy regarding the above risks and available options.  The approximate face-to-face time with the genetic counselor was 35 minutes.  Kandra Nicolas Gildardo Griffes, MS Certified Genetic Counselor 04/21/2014

## 2014-04-24 ENCOUNTER — Encounter (HOSPITAL_COMMUNITY): Payer: Self-pay

## 2014-04-24 ENCOUNTER — Other Ambulatory Visit (HOSPITAL_COMMUNITY): Payer: Self-pay | Admitting: Maternal and Fetal Medicine

## 2014-04-24 ENCOUNTER — Ambulatory Visit (HOSPITAL_COMMUNITY)
Admission: RE | Admit: 2014-04-24 | Discharge: 2014-04-24 | Disposition: A | Discharge status: Home / Self Care | Payer: Medicaid Other | Source: Ambulatory Visit | Attending: Nurse Practitioner | Admitting: Nurse Practitioner

## 2014-04-24 DIAGNOSIS — Z3689 Encounter for other specified antenatal screening: Secondary | ICD-10-CM | POA: Insufficient documentation

## 2014-04-24 DIAGNOSIS — O26879 Cervical shortening, unspecified trimester: Secondary | ICD-10-CM

## 2014-04-24 MED ORDER — BETAMETHASONE SOD PHOS & ACET 6 (3-3) MG/ML IJ SUSP
12.0000 mg | Freq: Once | INTRAMUSCULAR | Status: AC
  Administered 2014-04-24: 12 mg via INTRAMUSCULAR
  Filled 2014-04-24: qty 2

## 2014-04-25 ENCOUNTER — Encounter (HOSPITAL_COMMUNITY): Payer: Self-pay

## 2014-04-25 ENCOUNTER — Ambulatory Visit (HOSPITAL_COMMUNITY)
Admission: RE | Admit: 2014-04-25 | Discharge: 2014-04-25 | Disposition: A | Discharge status: Home / Self Care | Payer: Medicaid Other | Source: Ambulatory Visit | Attending: Nurse Practitioner | Admitting: Nurse Practitioner

## 2014-04-25 ENCOUNTER — Inpatient Hospital Stay (HOSPITAL_COMMUNITY)
Admission: AD | Admit: 2014-04-25 | Discharge: 2014-04-25 | Disposition: A | Discharge status: Home / Self Care | Payer: Medicaid Other | Source: Ambulatory Visit | Attending: Obstetrics & Gynecology | Admitting: Obstetrics & Gynecology

## 2014-04-25 ENCOUNTER — Encounter (HOSPITAL_COMMUNITY): Payer: Self-pay | Admitting: *Deleted

## 2014-04-25 DIAGNOSIS — Z833 Family history of diabetes mellitus: Secondary | ICD-10-CM | POA: Insufficient documentation

## 2014-04-25 DIAGNOSIS — O26879 Cervical shortening, unspecified trimester: Secondary | ICD-10-CM

## 2014-04-25 DIAGNOSIS — Z87891 Personal history of nicotine dependence: Secondary | ICD-10-CM | POA: Insufficient documentation

## 2014-04-25 DIAGNOSIS — Z3689 Encounter for other specified antenatal screening: Secondary | ICD-10-CM | POA: Insufficient documentation

## 2014-04-25 DIAGNOSIS — O4700 False labor before 37 completed weeks of gestation, unspecified trimester: Secondary | ICD-10-CM | POA: Insufficient documentation

## 2014-04-25 MED ORDER — PROGESTERONE MICRONIZED 200 MG PO CAPS: ORAL_CAPSULE | ORAL | Status: DC

## 2014-04-25 MED ORDER — BETAMETHASONE SOD PHOS & ACET 6 (3-3) MG/ML IJ SUSP
12.0000 mg | Freq: Once | INTRAMUSCULAR | Status: AC
  Administered 2014-04-25: 12 mg via INTRAMUSCULAR
  Filled 2014-04-25: qty 2

## 2014-04-25 NOTE — Discharge Instructions (Signed)
Cervical Insufficiency  °Cervical insufficiency is when the cervix is weak and starts to open (dilate) and thin (efface) before the pregnancy is at term and without labor starting. This is also called incompetent cervix. It can happen in the second or third trimester when the fetus starts putting pressure on the cervix. Cervical insufficiency can lead to a miscarriage, preterm premature rupture of the membranes (PPROM), or having the baby early (preterm birth).  °RISK FACTORS °You may be more likely to develop cervical insufficiency if: °· You have a shorter cervix than normal. °· Damage or injury occurred to your cervix from a past pregnancy or surgery. °· You were born with a cervical defect. °· You have had procedure done on the cervix, such as cervical biopsy. °· You have a history of cervical insufficiency. °· You have a history of PPROM. °· You have ended several past pregnancies through abortion. °· You were exposed to the drug diethylstilbestrol (DES). °SYMPTOMS °Often times, women do not have any symptoms. Other times, woman may only have mild symptoms that often start between week 14 through 20. The symptoms may last several days or weeks. These symptoms include: °· Light spotting or bleeding from the vagina. °· Pelvic pressure. °· A change in vaginal discharge, such as discharge that changes from clear, white, or light yellow to pink or tan. °· Back pain. °· Abdominal pain or cramping. °DIAGNOSIS °Cervical insufficiency cannot be diagnosed before you become pregnant. Once you are pregnant, your caregiver will ask about your medical history and if you have had any problems in past pregnancies. Tell your caregiver about any procedures performed on your cervix or if you have a history of miscarriages or cervical insufficiency. If your caregiver thinks you are at high risk for cervical insufficiency or show signs of cervical insufficiency, he or she may: °· Perform a pelvic exam. This will check for: °· The  presence of the membranes (amniotic sac) coming out of the cervix. °· Cervical abnormalities. °· Cervical injuries. °· The presence of contractions. °· Perform an ultrasonography (commonly called ultrasound) to measure the length and thickness of the cervix. °TREATMENT °If you have been diagnosed with cervical insufficiency, your caregiver may recommend: °· Limiting physical activity. °· Bed rest at home or in the hospital. °· Pelvic rest, which means no sexual intercourse or placing anything in the vagina. °· Cerclage to sew the cervix closed and prevent it from opening too early. The stitches (sutures) are removed between weeks 36 and 38 to avoid problems during labor. °Cerclage may be recommended during pregnancy if you have had a history of miscarriages or preterm births without a known cause. It may also be recommended if you have a short cervix that was identified by ultrasound or if your caregiver has found that your cervix has dilated before 24 weeks of pregnancy. Limiting physical activity and bed rest may or may not help prevent a preterm birth. °WHEN SHOULD YOU SEEK IMMEDIATE MEDICAL CARE?  °Seek immediate medical care if you show any symptoms of cervical insufficiency. You will need to go to the hospital to get checked immediately. °Document Released: 11/28/2005 Document Revised: 07/31/2013 Document Reviewed: 02/04/2013 °[employer]® Patient Information ©2014 [employer]. ° °

## 2014-04-25 NOTE — MAU Provider Note (Signed)
Chief Complaint:  Contractions   Christy West is a 28 y.o.  G1P0 with IUP at 6056w0d presenting for monitoring.    Pt was seen today in MFM for shortening cervix - 0.6cm (previously 1.4cm) and funneling.  Was given BMZ yesterday and returned today for repeat exam which confirmed the shortened and funnelled cervix.  She was then sent over to MAU for monitoring to ensure no contractions are seen.   Pt denies any complaints. No ctx, lof, vb.  +FM.  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  She has cfDNA testing running currently.     Menstrual History: OB History   Grav Para Term Preterm Abortions TAB SAB Ect Mult Living   1               Patient's last menstrual period was 11/08/2013.      Past Medical History  Diagnosis Date  . Medical history non-contributory     Past Surgical History  Procedure Laterality Date  . Nose surgery  2010    repair  . No past surgeries      Family History  Problem Relation Age of Onset  . Diabetes Paternal Uncle   . Diabetes Paternal Grandmother   . Hearing loss Neg Hx     History  Substance Use Topics  . Smoking status: Former Smoker -- 1.00 packs/day    Quit date: 12/16/2013  . Smokeless tobacco: Not on file  . Alcohol Use: Yes     No Known Allergies  Prescriptions prior to admission  Medication Sig Dispense Refill  . metroNIDAZOLE (FLAGYL) 500 MG tablet Take 500 mg by mouth 2 (two) times daily.      . Prenatal Vit-Fe Fumarate-FA (PRENATAL COMPLETE) 14-0.4 MG TABS Take 1 tablet by mouth daily.       Marland Kitchen. terconazole (TERAZOL 7) 0.4 % vaginal cream Place 1 applicator vaginally at bedtime.         Review of Systems - Negative except for what is mentioned in HPI.  Physical Exam  Last menstrual period 11/08/2013. GENERAL: Well-developed, well-nourished female in no acute distress.  LUNGS: Clear to auscultation bilaterally.  HEART: Regular rate  and rhythm. ABDOMEN: Soft, nontender, nondistended, gravid.  EXTREMITIES: Nontender, no edema, 2+ distal pulses. FHT:  Baseline rate 150 bpm   Variability moderate  Accelerations present   Decelerations variable Contractions: none   Labs: No results found for this or any previous visit (from the past 24 hour(s)).  Imaging Studies:  [facility] Ob Follow Up  04/18/2014   OBSTETRICAL ULTRASOUND: This exam was performed within a [facility]. The OB US report was generated in the AS system, and faxed to the ordering physician.   This report is also available in [facility] [employer] and in the [facility] [employer] PACS. See [facility] Obstetric US report.  [facility] Ob Transvaginal  04/24/2014   OBSTETRICAL ULTRASOUND: This exam was performed within a [facility]. The OB US report was generated in the AS system, and faxed to the ordering physician.   This report is also available in [facility] [employer] and in the [facility] PACS. See AS Obstetric US report.  [facility] Ob Transvaginal  04/18/2014   OBSTETRICAL ULTRASOUND: This exam was performed within a [facility]. The OB US report was generated in the AS system, and faxed to the ordering physician.   This report is also available in [facility]  [employer] and in the [facility] PACS. See [facility] Obstetric US report.   Assessment: Christy West is  28 y.o. G1P0 at 2746w0d presents for prolonged monitoring after incidentally found shortened cervix.  Plan: Observed for 4 hours and no ctx on TOCO - BMZ #2 given today - advised pelvic rest and very limited activity - cont Progesterone PV - f/u with MFM next week for cervical length - [facility].   fwb- reassuring for GA   [name] [name] 5/15/20153:20 PM

## 2014-04-25 NOTE — MAU Note (Signed)
Pt presents from MAU for monitoring from MFM for short cervix. States some mild cramping, denies and vaginal bleeding or LOF

## 2014-04-27 NOTE — MAU Provider Note (Signed)
`````  Attestation of Attending Supervision of Advanced Practitioner: Evaluation and management procedures were performed by the PA/NP/CNM/OB Fellow under my supervision/collaboration. Chart reviewed and agree with management and plan.  Tilda BurrowJohn V Kayden Hutmacher 04/27/2014 6:57 PM

## 2014-04-29 ENCOUNTER — Telehealth (HOSPITAL_COMMUNITY): Payer: Self-pay | Admitting: MS"

## 2014-04-29 NOTE — Telephone Encounter (Signed)
Called Christy West to discuss her cell free fetal DNA test results.  Mrs. Christy West had [facility] testing through [facility].  Testing was offered because of abnormal ultrasound findings.   The patient was identified by name and DOB.  We reviewed that these are within normal limits, showing a less than 1 in 10,000 risk for trisomies 21, 18 and 13, and monosomy X (Turner syndrome).  In addition, the risk for triploidy/vanishing twin and sex chromosome trisomies (47,XXX and 47,XXY) was also low risk. We reviewed that this testing identifies > 99% of pregnancies with trisomy 2321, trisomy 113, sex chromosome trisomies (47,XXX and 47,XXY), and triploidy. The detection rate for trisomy 18 is 96%.  The detection rate for monosomy X is ~92%.  The false positive rate is <0.1% for all conditions. Testing was also consistent with female fetal sex.  We reviewed that this does not screen for all chromosome or genetic conditions, including single genes that can be associated with holoprosencephaly.   All questions were answered to her satisfaction, she was encouraged to call with additional questions or concerns.  Helyn AppKaren Louise Jeni SallesEch Payten Beaumier, MS Certified Genetic Counselor 04/29/2014 2:09 PM

## 2014-05-01 ENCOUNTER — Ambulatory Visit (INDEPENDENT_AMBULATORY_CARE_PROVIDER_SITE_OTHER): Payer: Medicaid Other | Admitting: Family Medicine

## 2014-05-01 ENCOUNTER — Encounter: Payer: Self-pay | Admitting: Family Medicine

## 2014-05-01 VITALS — BP 104/66 | HR 82 | Temp 97.4°F | Wt 167.9 lb

## 2014-05-01 DIAGNOSIS — F1210 Cannabis abuse, uncomplicated: Secondary | ICD-10-CM

## 2014-05-01 DIAGNOSIS — F17200 Nicotine dependence, unspecified, uncomplicated: Secondary | ICD-10-CM

## 2014-05-01 DIAGNOSIS — N63 Unspecified lump in unspecified breast: Secondary | ICD-10-CM

## 2014-05-01 DIAGNOSIS — O0990 Supervision of high risk pregnancy, unspecified, unspecified trimester: Secondary | ICD-10-CM

## 2014-05-01 DIAGNOSIS — O4590 Premature separation of placenta, unspecified, unspecified trimester: Secondary | ICD-10-CM

## 2014-05-01 DIAGNOSIS — O418X90 Other specified disorders of amniotic fluid and membranes, unspecified trimester, not applicable or unspecified: Secondary | ICD-10-CM

## 2014-05-01 DIAGNOSIS — O359XX0 Maternal care for (suspected) fetal abnormality and damage, unspecified, not applicable or unspecified: Secondary | ICD-10-CM

## 2014-05-01 DIAGNOSIS — F1290 Cannabis use, unspecified, uncomplicated: Secondary | ICD-10-CM

## 2014-05-01 DIAGNOSIS — R87619 Unspecified abnormal cytological findings in specimens from cervix uteri: Secondary | ICD-10-CM | POA: Insufficient documentation

## 2014-05-01 DIAGNOSIS — IMO0002 Reserved for concepts with insufficient information to code with codable children: Secondary | ICD-10-CM

## 2014-05-01 DIAGNOSIS — O358XX0 Maternal care for other (suspected) fetal abnormality and damage, not applicable or unspecified: Secondary | ICD-10-CM

## 2014-05-01 DIAGNOSIS — O468X9 Other antepartum hemorrhage, unspecified trimester: Secondary | ICD-10-CM

## 2014-05-01 DIAGNOSIS — Z789 Other specified health status: Secondary | ICD-10-CM | POA: Insufficient documentation

## 2014-05-01 DIAGNOSIS — F1920 Other psychoactive substance dependence, uncomplicated: Secondary | ICD-10-CM

## 2014-05-01 DIAGNOSIS — O99320 Drug use complicating pregnancy, unspecified trimester: Secondary | ICD-10-CM

## 2014-05-01 DIAGNOSIS — O26879 Cervical shortening, unspecified trimester: Secondary | ICD-10-CM | POA: Insufficient documentation

## 2014-05-01 LAB — POCT URINALYSIS DIP (DEVICE)
Bilirubin Urine: NEGATIVE
GLUCOSE, UA: NEGATIVE mg/dL
HGB URINE DIPSTICK: NEGATIVE
Ketones, ur: NEGATIVE mg/dL
NITRITE: NEGATIVE
Protein, ur: NEGATIVE mg/dL
Specific Gravity, Urine: 1.02 (ref 1.005–1.030)
UROBILINOGEN UA: 0.2 mg/dL (ref 0.0–1.0)
pH: 7 (ref 5.0–8.0)

## 2014-05-01 NOTE — Patient Instructions (Signed)
Second Trimester of Pregnancy The second trimester is from week 13 through week 28, months 4 through 6. The second trimester is often a time when you feel your best. Your body has also adjusted to being pregnant, and you begin to feel better physically. Usually, morning sickness has lessened or quit completely, you may have more energy, and you may have an increase in appetite. The second trimester is also a time when the fetus is growing rapidly. At the end of the sixth month, the fetus is about 9 inches long and weighs about 1 pounds. You will likely begin to feel the baby move (quickening) between 18 and 20 weeks of the pregnancy. BODY CHANGES Your body goes through many changes during pregnancy. The changes vary from woman to woman.   Your weight will continue to increase. You will notice your lower abdomen bulging out.  You may begin to get stretch marks on your hips, abdomen, and breasts.  You may develop headaches that can be relieved by medicines approved by your caregiver.  You may urinate more often because the fetus is pressing on your bladder.  You may develop or continue to have heartburn as a result of your pregnancy.  You may develop constipation because certain hormones are causing the muscles that push waste through your intestines to slow down.  You may develop hemorrhoids or swollen, bulging veins (varicose veins).  You may have back pain because of the weight gain and pregnancy hormones relaxing your joints between the bones in your pelvis and as a result of a shift in weight and the muscles that support your balance.  Your breasts will continue to grow and be tender.  Your gums may bleed and may be sensitive to brushing and flossing.  Dark spots or blotches (chloasma, mask of pregnancy) may develop on your face. This will likely fade after the baby is born.  A dark line from your belly button to the pubic area (linea nigra) may appear. This will likely fade after  the baby is born. WHAT TO EXPECT AT YOUR PRENATAL VISITS During a routine prenatal visit:  You will be weighed to make sure you and the fetus are growing normally.  Your blood pressure will be taken.  Your abdomen will be measured to track your baby's growth.  The fetal heartbeat will be listened to.  Any test results from the previous visit will be discussed. Your caregiver may ask you:  How you are feeling.  If you are feeling the baby move.  If you have had any abnormal symptoms, such as leaking fluid, bleeding, severe headaches, or abdominal cramping.  If you have any questions. Other tests that may be performed during your second trimester include:  Blood tests that check for:  Low iron levels (anemia).  Gestational diabetes (between 24 and 28 weeks).  Rh antibodies.  Urine tests to check for infections, diabetes, or protein in the urine.  An ultrasound to confirm the proper growth and development of the baby.  An amniocentesis to check for possible genetic problems.  Fetal screens for spina bifida and Down syndrome. HOME CARE INSTRUCTIONS   Avoid all smoking, herbs, alcohol, and unprescribed drugs. These chemicals affect the formation and growth of the baby.  Follow your caregiver's instructions regarding medicine use. There are medicines that are either safe or unsafe to take during pregnancy.  Exercise only as directed by your caregiver. Experiencing uterine cramps is a good sign to stop exercising.  Continue to eat regular,   healthy meals.  Wear a good support bra for breast tenderness.  Do not use hot tubs, steam rooms, or saunas.  Wear your seat belt at all times when driving.  Avoid raw meat, uncooked cheese, cat litter boxes, and soil used by cats. These carry germs that can cause birth defects in the baby.  Take your prenatal vitamins.  Try taking a stool softener (if your caregiver approves) if you develop constipation. Eat more high-fiber  foods, such as fresh vegetables or fruit and whole grains. Drink plenty of fluids to keep your urine clear or pale yellow.  Take warm sitz baths to soothe any pain or discomfort caused by hemorrhoids. Use hemorrhoid cream if your caregiver approves.  If you develop varicose veins, wear support hose. Elevate your feet for 15 minutes, 3 4 times a day. Limit salt in your diet.  Avoid heavy lifting, wear low heel shoes, and practice good posture.  Rest with your legs elevated if you have leg cramps or low back pain.  Visit your dentist if you have not gone yet during your pregnancy. Use a soft toothbrush to brush your teeth and be gentle when you floss.  A sexual relationship may be continued unless your caregiver directs you otherwise.  Continue to go to all your prenatal visits as directed by your caregiver. SEEK MEDICAL CARE IF:   You have dizziness.  You have mild pelvic cramps, pelvic pressure, or nagging pain in the abdominal area.  You have persistent nausea, vomiting, or diarrhea.  You have a bad smelling vaginal discharge.  You have pain with urination. SEEK IMMEDIATE MEDICAL CARE IF:   You have a fever.  You are leaking fluid from your vagina.  You have spotting or bleeding from your vagina.  You have severe abdominal cramping or pain.  You have rapid weight gain or loss.  You have shortness of breath with chest pain.  You notice sudden or extreme swelling of your face, hands, ankles, feet, or legs.  You have not felt your baby move in over an hour.  You have severe headaches that do not go away with medicine.  You have vision changes. Document Released: 11/22/2001 Document Revised: 07/31/2013 Document Reviewed: 01/29/2013 [employer] Patient Information 2014 [employer].  Breastfeeding Deciding to breastfeed is one of the best choices you can make for you and your baby. A change in hormones during pregnancy causes your breast tissue to grow and increases the  number and size of your milk ducts. These hormones also allow proteins, sugars, and fats from your blood supply to make breast milk in your milk-producing glands. Hormones prevent breast milk from being released before your baby is born as well as prompt milk flow after birth. Once breastfeeding has begun, thoughts of your baby, as well as his or her sucking or crying, can stimulate the release of milk from your milk-producing glands.  BENEFITS OF BREASTFEEDING For Your Baby  Your first milk (colostrum) helps your baby's digestive system function better.   There are antibodies in your milk that help your baby fight off infections.   Your baby has a lower incidence of asthma, allergies, and sudden infant death syndrome.   The nutrients in breast milk are better for your baby than infant formulas and are designed uniquely for your baby's needs.   Breast milk improves your baby's brain development.   Your baby is less likely to develop other conditions, such as childhood obesity, asthma, or type 2 diabetes mellitus.  For   You   Breastfeeding helps to create a very special bond between you and your baby.   Breastfeeding is convenient. Breast milk is always available at the correct temperature and costs nothing.   Breastfeeding helps to burn calories and helps you lose the weight gained during pregnancy.   Breastfeeding makes your uterus contract to its prepregnancy size faster and slows bleeding (lochia) after you give birth.   Breastfeeding helps to lower your risk of developing type 2 diabetes mellitus, osteoporosis, and breast or ovarian cancer later in life. SIGNS THAT YOUR BABY IS HUNGRY Early Signs of Hunger  Increased alertness or activity.  Stretching.  Movement of the head from side to side.  Movement of the head and opening of the mouth when the corner of the mouth or cheek is stroked (rooting).  Increased sucking sounds, smacking lips, cooing, sighing, or  squeaking.  Hand-to-mouth movements.  Increased sucking of fingers or hands. Late Signs of Hunger  Fussing.  Intermittent crying. Extreme Signs of Hunger Signs of extreme hunger will require calming and consoling before your baby will be able to breastfeed successfully. Do not wait for the following signs of extreme hunger to occur before you initiate breastfeeding:   Restlessness.  A loud, strong cry.   Screaming. BREASTFEEDING BASICS Breastfeeding Initiation  Find a comfortable place to sit or lie down, with your neck and back well supported.  Place a pillow or rolled up blanket under your baby to bring him or her to the level of your breast (if you are seated). Nursing pillows are specially designed to help support your arms and your baby while you breastfeed.  Make sure that your baby's abdomen is facing your abdomen.   Gently massage your breast. With your fingertips, massage from your chest wall toward your nipple in a circular motion. This encourages milk flow. You may need to continue this action during the feeding if your milk flows slowly.  Support your breast with 4 fingers underneath and your thumb above your nipple. Make sure your fingers are well away from your nipple and your baby's mouth.   Stroke your baby's lips gently with your finger or nipple.   When your baby's mouth is open wide enough, quickly bring your baby to your breast, placing your entire nipple and as much of the colored area around your nipple (areola) as possible into your baby's mouth.   More areola should be visible above your baby's upper lip than below the lower lip.   Your baby's tongue should be between his or her lower gum and your breast.   Ensure that your baby's mouth is correctly positioned around your nipple (latched). Your baby's lips should create a seal on your breast and be turned out (everted).  It is common for your baby to suck about 2 3 minutes in order to start the  flow of breast milk. Latching Teaching your baby how to latch on to your breast properly is very important. An improper latch can cause nipple pain and decreased milk supply for you and poor weight gain in your baby. Also, if your baby is not latched onto your nipple properly, he or she may swallow some air during feeding. This can make your baby fussy. Burping your baby when you switch breasts during the feeding can help to get rid of the air. However, teaching your baby to latch on properly is still the best way to prevent fussiness from swallowing air while breastfeeding. Signs that your baby has   successfully latched on to your nipple:    Silent tugging or silent sucking, without causing you pain.   Swallowing heard between every 3 4 sucks.    Muscle movement above and in front of his or her ears while sucking.  Signs that your baby has not successfully latched on to nipple:   Sucking sounds or smacking sounds from your baby while breastfeeding.  Nipple pain. If you think your baby has not latched on correctly, slip your finger into the corner of your baby's mouth to break the suction and place it between your baby's gums. Attempt breastfeeding initiation again. Signs of Successful Breastfeeding Signs from your baby:   A gradual decrease in the number of sucks or complete cessation of sucking.   Falling asleep.   Relaxation of his or her body.   Retention of a small amount of milk in his or her mouth.   Letting go of your breast by himself or herself. Signs from you:  Breasts that have increased in firmness, weight, and size 1 3 hours after feeding.   Breasts that are softer immediately after breastfeeding.  Increased milk volume, as well as a change in milk consistency and color by the 5th day of breastfeeding.   Nipples that are not sore, cracked, or bleeding. Signs That Your Baby is Getting Enough Milk  Wetting at least 3 diapers in a 24-hour period. The urine  should be clear and pale yellow by age 5 days.  At least 3 stools in a 24-hour period by age 5 days. The stool should be soft and yellow.  At least 3 stools in a 24-hour period by age 7 days. The stool should be seedy and yellow.  No loss of weight greater than 10% of birth weight during the first 3 days of age.  Average weight gain of 4 7 ounces (120 210 mL) per week after age 4 days.  Consistent daily weight gain by age 5 days, without weight loss after the age of 2 weeks. After a feeding, your baby may spit up a small amount. This is common. BREASTFEEDING FREQUENCY AND DURATION Frequent feeding will help you make more milk and can prevent sore nipples and breast engorgement. Breastfeed when you feel the need to reduce the fullness of your breasts or when your baby shows signs of hunger. This is called "breastfeeding on demand." Avoid introducing a pacifier to your baby while you are working to establish breastfeeding (the first 4 6 weeks after your baby is born). After this time you may choose to use a pacifier. Research has shown that pacifier use during the first year of a baby's life decreases the risk of sudden infant death syndrome (SIDS). Allow your baby to feed on each breast as long as he or she wants. Breastfeed until your baby is finished feeding. When your baby unlatches or falls asleep while feeding from the first breast, offer the second breast. Because newborns are often sleepy in the first few weeks of life, you may need to awaken your baby to get him or her to feed. Breastfeeding times will vary from baby to baby. However, the following rules can serve as a guide to help you ensure that your baby is properly fed:  Newborns (babies 4 weeks of age or younger) may breastfeed every 1 3 hours.  Newborns should not go longer than 3 hours during the day or 5 hours during the night without breastfeeding.  You should breastfeed your baby a minimum of   8 times in a 24-hour period until  you begin to introduce solid foods to your baby at around 6 months of age. BREAST MILK PUMPING Pumping and storing breast milk allows you to ensure that your baby is exclusively fed your breast milk, even at times when you are unable to breastfeed. This is especially important if you are going back to work while you are still breastfeeding or when you are not able to be present during feedings. Your lactation consultant can give you guidelines on how long it is safe to store breast milk.  A breast pump is a machine that allows you to pump milk from your breast into a sterile bottle. The pumped breast milk can then be stored in a refrigerator or freezer. Some breast pumps are operated by hand, while others use electricity. Ask your lactation consultant which type will work best for you. Breast pumps can be purchased, but some hospitals and breastfeeding support groups lease breast pumps on a monthly basis. A lactation consultant can teach you how to hand express breast milk, if you prefer not to use a pump.  CARING FOR YOUR BREASTS WHILE YOU BREASTFEED Nipples can become dry, cracked, and sore while breastfeeding. The following recommendations can help keep your breasts moisturized and healthy:  Avoid using soap on your nipples.   Wear a supportive bra. Although not required, special nursing bras and tank tops are designed to allow access to your breasts for breastfeeding without taking off your entire bra or top. Avoid wearing underwire style bras or extremely tight bras.  Air dry your nipples for 3 4minutes after each feeding.   Use only cotton bra pads to absorb leaked breast milk. Leaking of breast milk between feedings is normal.   Use lanolin on your nipples after breastfeeding. Lanolin helps to maintain your skin's normal moisture barrier. If you use pure lanolin you do not need to wash it off before feeding your baby again. Pure lanolin is not toxic to your baby. You may also hand express a  few drops of breast milk and gently massage that milk into your nipples and allow the milk to air dry. In the first few weeks after giving birth, some women experience extremely full breasts (engorgement). Engorgement can make your breasts feel heavy, warm, and tender to the touch. Engorgement peaks within 3 5 days after you give birth. The following recommendations can help ease engorgement:  Completely empty your breasts while breastfeeding or pumping. You may want to start by applying warm, moist heat (in the shower or with warm water-soaked hand towels) just before feeding or pumping. This increases circulation and helps the milk flow. If your baby does not completely empty your breasts while breastfeeding, pump any extra milk after he or she is finished.  Wear a snug bra (nursing or regular) or tank top for 1 2 days to signal your body to slightly decrease milk production.  Apply ice packs to your breasts, unless this is too uncomfortable for you.  Make sure that your baby is latched on and positioned properly while breastfeeding. If engorgement persists after 48 hours of following these recommendations, contact your health care provider or a lactation consultant. OVERALL HEALTH CARE RECOMMENDATIONS WHILE BREASTFEEDING  Eat healthy foods. Alternate between meals and snacks, eating 3 of each per day. Because what you eat affects your breast milk, some of the foods may make your baby more irritable than usual. Avoid eating these foods if you are sure that they are   negatively affecting your baby.  Drink milk, fruit juice, and water to satisfy your thirst (about 10 glasses a day).   Rest often, relax, and continue to take your prenatal vitamins to prevent fatigue, stress, and anemia.  Continue breast self-awareness checks.  Avoid chewing and smoking tobacco.  Avoid alcohol and drug use. Some medicines that may be harmful to your baby can pass through breast milk. It is important to ask your  health care provider before taking any medicine, including all over-the-counter and prescription medicine as well as vitamin and herbal supplements. It is possible to become pregnant while breastfeeding. If birth control is desired, ask your health care provider about options that will be safe for your baby. SEEK MEDICAL CARE IF:   You feel like you want to stop breastfeeding or have become frustrated with breastfeeding.  You have painful breasts or nipples.  Your nipples are cracked or bleeding.  Your breasts are red, tender, or warm.  You have a swollen area on either breast.  You have a fever or chills.  You have nausea or vomiting.  You have drainage other than breast milk from your nipples.  Your breasts do not become full before feedings by the 5th day after you give birth.  You feel sad and depressed.  Your baby is too sleepy to eat well.  Your baby is having trouble sleeping.   Your baby is wetting less than 3 diapers in a 24-hour period.  Your baby has less than 3 stools in a 24-hour period.  Your baby's skin or the white part of his or her eyes becomes yellow.   Your baby is not gaining weight by 5 days of age. SEEK IMMEDIATE MEDICAL CARE IF:   Your baby is overly tired (lethargic) and does not want to wake up and feed.  Your baby develops an unexplained fever. Document Released: 11/28/2005 Document Revised: 07/31/2013 Document Reviewed: 05/22/2013 [employer] Patient Information 2014 [employer].  

## 2014-05-01 NOTE — Progress Notes (Signed)
[facility] ( pediatric cardiologist Bobbye MortonScott Maurer MD [phone]) His nurse will fax the results as soon as it is reviewed and signed.

## 2014-05-01 NOTE — Progress Notes (Signed)
[facility] No s/sx's of PTL On Prometrium-s/p BMZ MFM f/u arranged--for MRI at 30 wks. Normal ECHO per pt--will get results Had CFDNA testing with MFM--will get results. On probation

## 2014-05-01 NOTE — Progress Notes (Signed)
Nutrition note: 1st visit consult Pt has gained 25.9# @ 6165w6d, which is > expected. Pt is taking a PNV. Pt reports eating 5-6x/d. Pt reports no N/V or heartburn. Pt received verbal & written education on general nutrition during pregnancy. Discussed wt gain goals of 25-35# or 1#/wk. Pt agrees to continue taking a PNV. Pt does not have WIC but plans to apply. Pt plans to BF. F/u if referred Blondell RevealLaura Suann Klier, MS, RD, LDN, [facility]

## 2014-05-01 NOTE — Progress Notes (Signed)
C./o pain like a tight ball under Left rib( upper) epigastric area. Given new pregnancy information. Discussed bmi / weight gain.

## 2014-05-03 LAB — PRESCRIPTION MONITORING PROFILE (19 PANEL)
Amphetamine/Meth: NEGATIVE ng/mL
BARBITURATE SCREEN, URINE: NEGATIVE ng/mL
BENZODIAZEPINE SCREEN, URINE: NEGATIVE ng/mL
Buprenorphine, Urine: NEGATIVE ng/mL
CREATININE, URINE: 75.34 mg/dL (ref >20.0)
Cannabinoid Scrn, Ur: NEGATIVE ng/mL
Carisoprodol, Urine: NEGATIVE ng/mL
Cocaine Metabolites: NEGATIVE ng/mL
Fentanyl, Ur: NEGATIVE ng/mL
MDMA URINE: NEGATIVE ng/mL
Meperidine, Ur: NEGATIVE ng/mL
Methadone Screen, Urine: NEGATIVE ng/mL
Methaqualone: NEGATIVE ng/mL
NITRITES URINE, INITIAL: NEGATIVE ug/mL
OPIATE SCREEN, URINE: NEGATIVE ng/mL
OXYCODONE SCRN UR: NEGATIVE ng/mL
PH URINE, INITIAL: 7.5 pH (ref 4.5–8.9)
PROPOXYPHENE: NEGATIVE ng/mL
Phencyclidine, Ur: NEGATIVE ng/mL
TRAMADOL UR: NEGATIVE ng/mL
Tapentadol, urine: NEGATIVE ng/mL
ZOLPIDEM, URINE: NEGATIVE ng/mL

## 2014-05-06 ENCOUNTER — Other Ambulatory Visit: Payer: Self-pay | Admitting: Family Medicine

## 2014-05-06 ENCOUNTER — Encounter: Payer: Self-pay | Admitting: *Deleted

## 2014-05-06 DIAGNOSIS — O26879 Cervical shortening, unspecified trimester: Secondary | ICD-10-CM

## 2014-05-06 DIAGNOSIS — O3500X0 Maternal care for (suspected) central nervous system malformation or damage in fetus, unspecified, not applicable or unspecified: Secondary | ICD-10-CM

## 2014-05-06 DIAGNOSIS — O350XX0 Maternal care for (suspected) central nervous system malformation in fetus, not applicable or unspecified: Secondary | ICD-10-CM

## 2014-05-07 ENCOUNTER — Ambulatory Visit (HOSPITAL_COMMUNITY)
Admission: RE | Admit: 2014-05-07 | Discharge: 2014-05-07 | Disposition: A | Discharge status: Home / Self Care | Payer: Medicaid Other | Source: Ambulatory Visit | Attending: Family Medicine | Admitting: Family Medicine

## 2014-05-07 ENCOUNTER — Encounter (HOSPITAL_COMMUNITY): Payer: Self-pay

## 2014-05-07 ENCOUNTER — Encounter: Payer: Self-pay | Admitting: *Deleted

## 2014-05-07 DIAGNOSIS — O26879 Cervical shortening, unspecified trimester: Secondary | ICD-10-CM | POA: Insufficient documentation

## 2014-05-07 DIAGNOSIS — O3500X0 Maternal care for (suspected) central nervous system malformation or damage in fetus, unspecified, not applicable or unspecified: Secondary | ICD-10-CM | POA: Insufficient documentation

## 2014-05-07 DIAGNOSIS — O350XX0 Maternal care for (suspected) central nervous system malformation in fetus, not applicable or unspecified: Secondary | ICD-10-CM

## 2014-05-12 ENCOUNTER — Encounter: Payer: Self-pay | Admitting: *Deleted

## 2014-05-14 ENCOUNTER — Other Ambulatory Visit (HOSPITAL_COMMUNITY): Payer: Self-pay | Admitting: Maternal and Fetal Medicine

## 2014-05-14 DIAGNOSIS — F17200 Nicotine dependence, unspecified, uncomplicated: Secondary | ICD-10-CM

## 2014-05-14 DIAGNOSIS — F1290 Cannabis use, unspecified, uncomplicated: Secondary | ICD-10-CM

## 2014-05-14 DIAGNOSIS — O26879 Cervical shortening, unspecified trimester: Secondary | ICD-10-CM

## 2014-05-15 ENCOUNTER — Ambulatory Visit: Payer: Medicaid Other | Admitting: Obstetrics & Gynecology

## 2014-05-15 VITALS — BP 117/74 | HR 88 | Temp 97.9°F | Wt 166.5 lb

## 2014-05-15 DIAGNOSIS — O26879 Cervical shortening, unspecified trimester: Secondary | ICD-10-CM

## 2014-05-15 DIAGNOSIS — O0990 Supervision of high risk pregnancy, unspecified, unspecified trimester: Secondary | ICD-10-CM

## 2014-05-15 LAB — POCT URINALYSIS DIP (DEVICE)
BILIRUBIN URINE: NEGATIVE
Glucose, UA: NEGATIVE mg/dL
Hgb urine dipstick: NEGATIVE
KETONES UR: NEGATIVE mg/dL
Nitrite: NEGATIVE
PROTEIN: NEGATIVE mg/dL
SPECIFIC GRAVITY, URINE: 1.02 (ref 1.005–1.030)
Urobilinogen, UA: 1 mg/dL (ref 0.0–1.0)
pH: 7 (ref 5.0–8.0)

## 2014-05-15 NOTE — Progress Notes (Signed)
Pt c/o peeing on herself.  She says is smells like urine.  Pt also having sex and fluid comes out after ejacualtion.  SSE--negative pooling, negative ferning.  Sperm on slide.  Cervix 1 long and posterior with tone.  Internal os not reached.  Cervix doesn't feel short as described on [place].  Pt has f/u US in [facility] this month. Continue prometrium.  If still c/o of "peeing" on herself, pelase come back in re evaluation.

## 2014-05-22 ENCOUNTER — Other Ambulatory Visit: Payer: Medicaid Other

## 2014-05-29 ENCOUNTER — Encounter: Payer: Self-pay | Admitting: Family Medicine

## 2014-05-29 ENCOUNTER — Ambulatory Visit (INDEPENDENT_AMBULATORY_CARE_PROVIDER_SITE_OTHER): Payer: Medicaid Other | Admitting: Family Medicine

## 2014-05-29 VITALS — BP 118/85 | HR 76 | Temp 96.9°F | Wt 172.0 lb

## 2014-05-29 DIAGNOSIS — Z23 Encounter for immunization: Secondary | ICD-10-CM

## 2014-05-29 DIAGNOSIS — Z3400 Encounter for supervision of normal first pregnancy, unspecified trimester: Secondary | ICD-10-CM

## 2014-05-29 LAB — CBC
HCT: 29.1 % — ABNORMAL LOW (ref 36.0–46.0)
HEMOGLOBIN: 10.1 g/dL — AB (ref 12.0–15.0)
MCH: 30.5 pg (ref 26.0–34.0)
MCHC: 34.7 g/dL (ref 30.0–36.0)
MCV: 87.9 fL (ref 78.0–100.0)
Platelets: 294 10*3/uL (ref 150–400)
RBC: 3.31 MIL/uL — AB (ref 3.87–5.11)
RDW: 14.5 % (ref 11.5–15.5)
WBC: 7.1 10*3/uL (ref 4.0–10.5)

## 2014-05-29 LAB — POCT URINALYSIS DIP (DEVICE)
BILIRUBIN URINE: NEGATIVE
GLUCOSE, UA: NEGATIVE mg/dL
Hgb urine dipstick: NEGATIVE
KETONES UR: NEGATIVE mg/dL
Leukocytes, UA: NEGATIVE
Nitrite: NEGATIVE
Protein, ur: NEGATIVE mg/dL
SPECIFIC GRAVITY, URINE: 1.02 (ref 1.005–1.030)
Urobilinogen, UA: 1 mg/dL (ref 0.0–1.0)
pH: 7 (ref 5.0–8.0)

## 2014-05-29 MED ORDER — TETANUS-DIPHTH-ACELL PERTUSSIS 5-2.5-18.5 LF-MCG/0.5 IM SUSP
0.5000 mL | Freq: Once | INTRAMUSCULAR | Status: AC
  Administered 2014-05-29: 0.5 mL via INTRAMUSCULAR

## 2014-05-29 NOTE — Progress Notes (Signed)
28 wk labs and TDaP today Not having any s/sx's of PRL Continue prometrium

## 2014-05-29 NOTE — Patient Instructions (Signed)
Third Trimester of Pregnancy The third trimester is from week 29 through week 42, months 7 through 9. The third trimester is a time when the fetus is growing rapidly. At the end of the ninth month, the fetus is about 20 inches in length and weighs 6-10 pounds.  BODY CHANGES Your body goes through many changes during pregnancy. The changes vary from woman to woman.   Your weight will continue to increase. You can expect to gain 25-35 pounds (11-16 kg) by the end of the pregnancy.  You may begin to get stretch marks on your hips, abdomen, and breasts.  You may urinate more often because the fetus is moving lower into your pelvis and pressing on your bladder.  You may develop or continue to have heartburn as a result of your pregnancy.  You may develop constipation because certain hormones are causing the muscles that push waste through your intestines to slow down.  You may develop hemorrhoids or swollen, bulging veins (varicose veins).  You may have pelvic pain because of the weight gain and pregnancy hormones relaxing your joints between the bones in your pelvis. Backaches may result from overexertion of the muscles supporting your posture.  You may have changes in your hair. These can include thickening of your hair, rapid growth, and changes in texture. Some women also have hair loss during or after pregnancy, or hair that feels dry or thin. Your hair will most likely return to normal after your baby is born.  Your breasts will continue to grow and be tender. A yellow discharge may leak from your breasts called colostrum.  Your belly button may stick out.  You may feel short of breath because of your expanding uterus.  You may notice the fetus "dropping," or moving lower in your abdomen.  You may have a bloody mucus discharge. This usually occurs a few days to a week before labor begins.  Your cervix becomes thin and soft (effaced) near your due date. WHAT TO EXPECT AT YOUR  PRENATAL EXAMS  You will have prenatal exams every 2 weeks until week 36. Then, you will have weekly prenatal exams. During a routine prenatal visit:  You will be weighed to make sure you and the fetus are growing normally.  Your blood pressure is taken.  Your abdomen will be measured to track your baby's growth.  The fetal heartbeat will be listened to.  Any test results from the previous visit will be discussed.  You may have a cervical check near your due date to see if you have effaced. At around 36 weeks, your caregiver will check your cervix. At the same time, your caregiver will also perform a test on the secretions of the vaginal tissue. This test is to determine if a type of bacteria, Group B streptococcus, is present. Your caregiver will explain this further. Your caregiver may ask you:  What your birth plan is.  How you are feeling.  If you are feeling the baby move.  If you have had any abnormal symptoms, such as leaking fluid, bleeding, severe headaches, or abdominal cramping.  If you have any questions. Other tests or screenings that may be performed during your third trimester include:  Blood tests that check for low iron levels (anemia).  Fetal testing to check the health, activity level, and growth of the fetus. Testing is done if you have certain medical conditions or if there are problems during the pregnancy. FALSE LABOR You may feel small, irregular contractions that   eventually go away. These are called Braxton Hicks contractions, or false labor. Contractions may last for hours, days, or even weeks before true labor sets in. If contractions come at regular intervals, intensify, or become painful, it is best to be seen by your caregiver.  SIGNS OF LABOR   Menstrual-like cramps.  Contractions that are 5 minutes apart or less.  Contractions that start on the top of the uterus and spread down to the lower abdomen and back.  A sense of increased pelvic  pressure or back pain.  A watery or bloody mucus discharge that comes from the vagina. If you have any of these signs before the 37th week of pregnancy, call your caregiver right away. You need to go to the hospital to get checked immediately. HOME CARE INSTRUCTIONS   Avoid all smoking, herbs, alcohol, and unprescribed drugs. These chemicals affect the formation and growth of the baby.  Follow your caregiver's instructions regarding medicine use. There are medicines that are either safe or unsafe to take during pregnancy.  Exercise only as directed by your caregiver. Experiencing uterine cramps is a good sign to stop exercising.  Continue to eat regular, healthy meals.  Wear a good support bra for breast tenderness.  Do not use hot tubs, steam rooms, or saunas.  Wear your seat belt at all times when driving.  Avoid raw meat, uncooked cheese, cat litter boxes, and soil used by cats. These carry germs that can cause birth defects in the baby.  Take your prenatal vitamins.  Try taking a stool softener (if your caregiver approves) if you develop constipation. Eat more high-fiber foods, such as fresh vegetables or fruit and whole grains. Drink plenty of fluids to keep your urine clear or pale yellow.  Take warm sitz baths to soothe any pain or discomfort caused by hemorrhoids. Use hemorrhoid cream if your caregiver approves.  If you develop varicose veins, wear support hose. Elevate your feet for 15 minutes, 3-4 times a day. Limit salt in your diet.  Avoid heavy lifting, wear low heal shoes, and practice good posture.  Rest a lot with your legs elevated if you have leg cramps or low back pain.  Visit your dentist if you have not gone during your pregnancy. Use a soft toothbrush to brush your teeth and be gentle when you floss.  A sexual relationship may be continued unless your caregiver directs you otherwise.  Do not travel far distances unless it is absolutely necessary and only  with the approval of your caregiver.  Take prenatal classes to understand, practice, and ask questions about the labor and delivery.  Make a trial run to the hospital.  Pack your hospital bag.  Prepare the baby's nursery.  Continue to go to all your prenatal visits as directed by your caregiver. SEEK MEDICAL CARE IF:  You are unsure if you are in labor or if your water has broken.  You have dizziness.  You have mild pelvic cramps, pelvic pressure, or nagging pain in your abdominal area.  You have persistent nausea, vomiting, or diarrhea.  You have a bad smelling vaginal discharge.  You have pain with urination. SEEK IMMEDIATE MEDICAL CARE IF:   You have a fever.  You are leaking fluid from your vagina.  You have spotting or bleeding from your vagina.  You have severe abdominal cramping or pain.  You have rapid weight loss or gain.  You have shortness of breath with chest pain.  You notice sudden or extreme swelling   of your face, hands, ankles, feet, or legs.  You have not felt your baby move in over an hour.  You have severe headaches that do not go away with medicine.  You have vision changes. Document Released: 11/22/2001 Document Revised: 12/03/2013 Document Reviewed: 01/29/2013 [employer] Patient Information 2015 [employer]. This information is not intended to replace advice given to you by your health care provider. Make sure you discuss any questions you have with your health care provider.  Breastfeeding Deciding to breastfeed is one of the best choices you can make for you and your baby. A change in hormones during pregnancy causes your breast tissue to grow and increases the number and size of your milk ducts. These hormones also allow proteins, sugars, and fats from your blood supply to make breast milk in your milk-producing glands. Hormones prevent breast milk from being released before your baby is born as well as prompt milk flow after birth. Once  breastfeeding has begun, thoughts of your baby, as well as his or her sucking or crying, can stimulate the release of milk from your milk-producing glands.  BENEFITS OF BREASTFEEDING For Your Baby  Your first milk (colostrum) helps your baby's digestive system function better.   There are antibodies in your milk that help your baby fight off infections.   Your baby has a lower incidence of asthma, allergies, and sudden infant death syndrome.   The nutrients in breast milk are better for your baby than infant formulas and are designed uniquely for your baby's needs.   Breast milk improves your baby's brain development.   Your baby is less likely to develop other conditions, such as childhood obesity, asthma, or type 2 diabetes mellitus.  For You   Breastfeeding helps to create a very special bond between you and your baby.   Breastfeeding is convenient. Breast milk is always available at the correct temperature and costs nothing.   Breastfeeding helps to burn calories and helps you lose the weight gained during pregnancy.   Breastfeeding makes your uterus contract to its prepregnancy size faster and slows bleeding (lochia) after you give birth.   Breastfeeding helps to lower your risk of developing type 2 diabetes mellitus, osteoporosis, and breast or ovarian cancer later in life. SIGNS THAT YOUR BABY IS HUNGRY Early Signs of Hunger  Increased alertness or activity.  Stretching.  Movement of the head from side to side.  Movement of the head and opening of the mouth when the corner of the mouth or cheek is stroked (rooting).  Increased sucking sounds, smacking lips, cooing, sighing, or squeaking.  Hand-to-mouth movements.  Increased sucking of fingers or hands. Late Signs of Hunger  Fussing.  Intermittent crying. Extreme Signs of Hunger Signs of extreme hunger will require calming and consoling before your baby will be able to breastfeed successfully. Do not  wait for the following signs of extreme hunger to occur before you initiate breastfeeding:   Restlessness.  A loud, strong cry.   Screaming. BREASTFEEDING BASICS Breastfeeding Initiation  Find a comfortable place to sit or lie down, with your neck and back well supported.  Place a pillow or rolled up blanket under your baby to bring him or her to the level of your breast (if you are seated). Nursing pillows are specially designed to help support your arms and your baby while you breastfeed.  Make sure that your baby's abdomen is facing your abdomen.   Gently massage your breast. With your fingertips, massage from your chest   wall toward your nipple in a circular motion. This encourages milk flow. You may need to continue this action during the feeding if your milk flows slowly.  Support your breast with 4 fingers underneath and your thumb above your nipple. Make sure your fingers are well away from your nipple and your baby's mouth.   Stroke your baby's lips gently with your finger or nipple.   When your baby's mouth is open wide enough, quickly bring your baby to your breast, placing your entire nipple and as much of the colored area around your nipple (areola) as possible into your baby's mouth.   More areola should be visible above your baby's upper lip than below the lower lip.   Your baby's tongue should be between his or her lower gum and your breast.   Ensure that your baby's mouth is correctly positioned around your nipple (latched). Your baby's lips should create a seal on your breast and be turned out (everted).  It is common for your baby to suck about 2-3 minutes in order to start the flow of breast milk. Latching Teaching your baby how to latch on to your breast properly is very important. An improper latch can cause nipple pain and decreased milk supply for you and poor weight gain in your baby. Also, if your baby is not latched onto your nipple properly, he or she  may swallow some air during feeding. This can make your baby fussy. Burping your baby when you switch breasts during the feeding can help to get rid of the air. However, teaching your baby to latch on properly is still the best way to prevent fussiness from swallowing air while breastfeeding. Signs that your baby has successfully latched on to your nipple:    Silent tugging or silent sucking, without causing you pain.   Swallowing heard between every 3-4 sucks.    Muscle movement above and in front of his or her ears while sucking.  Signs that your baby has not successfully latched on to nipple:   Sucking sounds or smacking sounds from your baby while breastfeeding.  Nipple pain. If you think your baby has not latched on correctly, slip your finger into the corner of your baby's mouth to break the suction and place it between your baby's gums. Attempt breastfeeding initiation again. Signs of Successful Breastfeeding Signs from your baby:   A gradual decrease in the number of sucks or complete cessation of sucking.   Falling asleep.   Relaxation of his or her body.   Retention of a small amount of milk in his or her mouth.   Letting go of your breast by himself or herself. Signs from you:  Breasts that have increased in firmness, weight, and size 1-3 hours after feeding.   Breasts that are softer immediately after breastfeeding.  Increased milk volume, as well as a change in milk consistency and color by the fifth day of breastfeeding.   Nipples that are not sore, cracked, or bleeding. Signs That Your Baby is Getting Enough Milk  Wetting at least 3 diapers in a 24-hour period. The urine should be clear and pale yellow by age 5 days.  At least 3 stools in a 24-hour period by age 5 days. The stool should be soft and yellow.  At least 3 stools in a 24-hour period by age 7 days. The stool should be seedy and yellow.  No loss of weight greater than 10% of birth weight  during the first 3   days of age.  Average weight gain of 4-7 ounces (113-198 g) per week after age 4 days.  Consistent daily weight gain by age 5 days, without weight loss after the age of 2 weeks. After a feeding, your baby may spit up a small amount. This is common. BREASTFEEDING FREQUENCY AND DURATION Frequent feeding will help you make more milk and can prevent sore nipples and breast engorgement. Breastfeed when you feel the need to reduce the fullness of your breasts or when your baby shows signs of hunger. This is called "breastfeeding on demand." Avoid introducing a pacifier to your baby while you are working to establish breastfeeding (the first 4-6 weeks after your baby is born). After this time you may choose to use a pacifier. Research has shown that pacifier use during the first year of a baby's life decreases the risk of sudden infant death syndrome (SIDS). Allow your baby to feed on each breast as long as he or she wants. Breastfeed until your baby is finished feeding. When your baby unlatches or falls asleep while feeding from the first breast, offer the second breast. Because newborns are often sleepy in the first few weeks of life, you may need to awaken your baby to get him or her to feed. Breastfeeding times will vary from baby to baby. However, the following rules can serve as a guide to help you ensure that your baby is properly fed:  Newborns (babies 4 weeks of age or younger) may breastfeed every 1-3 hours.  Newborns should not go longer than 3 hours during the day or 5 hours during the night without breastfeeding.  You should breastfeed your baby a minimum of 8 times in a 24-hour period until you begin to introduce solid foods to your baby at around 6 months of age. BREAST MILK PUMPING Pumping and storing breast milk allows you to ensure that your baby is exclusively fed your breast milk, even at times when you are unable to breastfeed. This is especially important if you are  going back to work while you are still breastfeeding or when you are not able to be present during feedings. Your lactation consultant can give you guidelines on how long it is safe to store breast milk.  A breast pump is a machine that allows you to pump milk from your breast into a sterile bottle. The pumped breast milk can then be stored in a refrigerator or freezer. Some breast pumps are operated by hand, while others use electricity. Ask your lactation consultant which type will work best for you. Breast pumps can be purchased, but some hospitals and breastfeeding support groups lease breast pumps on a monthly basis. A lactation consultant can teach you how to hand express breast milk, if you prefer not to use a pump.  CARING FOR YOUR BREASTS WHILE YOU BREASTFEED Nipples can become dry, cracked, and sore while breastfeeding. The following recommendations can help keep your breasts moisturized and healthy:  Avoid using soap on your nipples.   Wear a supportive bra. Although not required, special nursing bras and tank tops are designed to allow access to your breasts for breastfeeding without taking off your entire bra or top. Avoid wearing underwire-style bras or extremely tight bras.  Air dry your nipples for 3-4minutes after each feeding.   Use only cotton bra pads to absorb leaked breast milk. Leaking of breast milk between feedings is normal.   Use lanolin on your nipples after breastfeeding. Lanolin helps to maintain your skin's   normal moisture barrier. If you use pure lanolin, you do not need to wash it off before feeding your baby again. Pure lanolin is not toxic to your baby. You may also hand express a few drops of breast milk and gently massage that milk into your nipples and allow the milk to air dry. In the first few weeks after giving birth, some women experience extremely full breasts (engorgement). Engorgement can make your breasts feel heavy, warm, and tender to the touch.  Engorgement peaks within 3-5 days after you give birth. The following recommendations can help ease engorgement:  Completely empty your breasts while breastfeeding or pumping. You may want to start by applying warm, moist heat (in the shower or with warm water-soaked hand towels) just before feeding or pumping. This increases circulation and helps the milk flow. If your baby does not completely empty your breasts while breastfeeding, pump any extra milk after he or she is finished.  Wear a snug bra (nursing or regular) or tank top for 1-2 days to signal your body to slightly decrease milk production.  Apply ice packs to your breasts, unless this is too uncomfortable for you.  Make sure that your baby is latched on and positioned properly while breastfeeding. If engorgement persists after 48 hours of following these recommendations, contact your health care provider or a lactation consultant. OVERALL HEALTH CARE RECOMMENDATIONS WHILE BREASTFEEDING  Eat healthy foods. Alternate between meals and snacks, eating 3 of each per day. Because what you eat affects your breast milk, some of the foods may make your baby more irritable than usual. Avoid eating these foods if you are sure that they are negatively affecting your baby.  Drink milk, fruit juice, and water to satisfy your thirst (about 10 glasses a day).   Rest often, relax, and continue to take your prenatal vitamins to prevent fatigue, stress, and anemia.  Continue breast self-awareness checks.  Avoid chewing and smoking tobacco.  Avoid alcohol and drug use. Some medicines that may be harmful to your baby can pass through breast milk. It is important to ask your health care provider before taking any medicine, including all over-the-counter and prescription medicine as well as vitamin and herbal supplements. It is possible to become pregnant while breastfeeding. If birth control is desired, ask your health care provider about options that  will be safe for your baby. SEEK MEDICAL CARE IF:   You feel like you want to stop breastfeeding or have become frustrated with breastfeeding.  You have painful breasts or nipples.  Your nipples are cracked or bleeding.  Your breasts are red, tender, or warm.  You have a swollen area on either breast.  You have a fever or chills.  You have nausea or vomiting.  You have drainage other than breast milk from your nipples.  Your breasts do not become full before feedings by the fifth day after you give birth.  You feel sad and depressed.  Your baby is too sleepy to eat well.  Your baby is having trouble sleeping.   Your baby is wetting less than 3 diapers in a 24-hour period.  Your baby has less than 3 stools in a 24-hour period.  Your baby's skin or the white part of his or her eyes becomes yellow.   Your baby is not gaining weight by 5 days of age. SEEK IMMEDIATE MEDICAL CARE IF:   Your baby is overly tired (lethargic) and does not want to wake up and feed.  Your baby   develops an unexplained fever. Document Released: 11/28/2005 Document Revised: 12/03/2013 Document Reviewed: 05/22/2013 [employer] Patient Information 2015 [employer]. This information is not intended to replace advice given to you by your health care provider. Make sure you discuss any questions you have with your health care provider.  

## 2014-05-30 ENCOUNTER — Encounter: Payer: Self-pay | Admitting: Family Medicine

## 2014-05-30 LAB — GLUCOSE TOLERANCE, 1 HOUR (50G) W/O FASTING: GLUCOSE 1 HOUR GTT: 109 mg/dL (ref 70–140)

## 2014-05-30 LAB — HIV ANTIBODY (ROUTINE TESTING W REFLEX): HIV: NONREACTIVE

## 2014-05-30 LAB — RPR

## 2014-06-04 ENCOUNTER — Ambulatory Visit (HOSPITAL_COMMUNITY)
Admission: RE | Admit: 2014-06-04 | Discharge: 2014-06-04 | Disposition: A | Discharge status: Home / Self Care | Payer: Medicaid Other | Source: Ambulatory Visit | Attending: Family Medicine | Admitting: Family Medicine

## 2014-06-04 VITALS — BP 105/62 | HR 108 | Wt 174.5 lb

## 2014-06-04 DIAGNOSIS — F1210 Cannabis abuse, uncomplicated: Secondary | ICD-10-CM | POA: Insufficient documentation

## 2014-06-04 DIAGNOSIS — O99340 Other mental disorders complicating pregnancy, unspecified trimester: Secondary | ICD-10-CM | POA: Insufficient documentation

## 2014-06-04 DIAGNOSIS — O26879 Cervical shortening, unspecified trimester: Secondary | ICD-10-CM | POA: Insufficient documentation

## 2014-06-04 DIAGNOSIS — F1290 Cannabis use, unspecified, uncomplicated: Secondary | ICD-10-CM

## 2014-06-04 DIAGNOSIS — Z3689 Encounter for other specified antenatal screening: Secondary | ICD-10-CM | POA: Insufficient documentation

## 2014-06-04 DIAGNOSIS — F17200 Nicotine dependence, unspecified, uncomplicated: Secondary | ICD-10-CM

## 2014-06-04 DIAGNOSIS — O359XX1 Maternal care for (suspected) fetal abnormality and damage, unspecified, fetus 1: Secondary | ICD-10-CM

## 2014-06-04 DIAGNOSIS — O99330 Smoking (tobacco) complicating pregnancy, unspecified trimester: Secondary | ICD-10-CM | POA: Insufficient documentation

## 2014-06-04 DIAGNOSIS — O358XX0 Maternal care for other (suspected) fetal abnormality and damage, not applicable or unspecified: Secondary | ICD-10-CM | POA: Insufficient documentation

## 2014-06-04 NOTE — Progress Notes (Signed)
[facility] ultrasound  Indication: 28 yr old G1P0 at 7050w5d with fetus with suspected absent cavum and intracranial abnormality with ventriculomegaly for follow up ultrasound.  Findings: 1. Single intrauterine pregnancy. 2. Estimated fetal weight is in the 42nd%. 3. Posterior placenta without evidence of previa. 4. Normal amniotic fluid index. 5. Again the cavum appears absent. 6. Again seen is ventriculomegaly; the lateral ventricles measure 1.6cm. 7. The intracranial anatomy again appears abnormal, but appears stable from prior exam. 8. The transabdominal cervical length appears short at 0.8cm.  Recommendations: 1. Appropriate fetal growth. 2. Intracranial anomaly: - previously counseled - for fetal MRI 06/11/14 - referral to Pediatric Neurosurgery - normal cell free fetal DNA 3. Recommend follow up in 4 weeks 4. Short cervix: - previously counseled - s/p betamethasone - preterm labor precautions  Eulis FosterKristen Quinn, MD

## 2014-06-12 ENCOUNTER — Encounter: Payer: Self-pay | Admitting: Family Medicine

## 2014-06-12 ENCOUNTER — Ambulatory Visit (INDEPENDENT_AMBULATORY_CARE_PROVIDER_SITE_OTHER): Payer: Medicaid Other | Admitting: Family Medicine

## 2014-06-12 VITALS — BP 114/74 | HR 90 | Temp 97.8°F | Wt 176.2 lb

## 2014-06-12 DIAGNOSIS — O26879 Cervical shortening, unspecified trimester: Secondary | ICD-10-CM

## 2014-06-12 DIAGNOSIS — F1920 Other psychoactive substance dependence, uncomplicated: Secondary | ICD-10-CM

## 2014-06-12 DIAGNOSIS — O0990 Supervision of high risk pregnancy, unspecified, unspecified trimester: Secondary | ICD-10-CM

## 2014-06-12 DIAGNOSIS — O99320 Drug use complicating pregnancy, unspecified trimester: Secondary | ICD-10-CM
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
## 2014-06-12 NOTE — Progress Notes (Signed)
Edema-feet   

## 2014-06-12 NOTE — Patient Instructions (Signed)
Third Trimester of Pregnancy The third trimester is from week 29 through week 42, months 7 through 9. The third trimester is a time when the fetus is growing rapidly. At the end of the ninth month, the fetus is about 20 inches in length and weighs 6-10 pounds.  BODY CHANGES Your body goes through many changes during pregnancy. The changes vary from woman to woman.   Your weight will continue to increase. You can expect to gain 25-35 pounds (11-16 kg) by the end of the pregnancy.  You may begin to get stretch marks on your hips, abdomen, and breasts.  You may urinate more often because the fetus is moving lower into your pelvis and pressing on your bladder.  You may develop or continue to have heartburn as a result of your pregnancy.  You may develop constipation because certain hormones are causing the muscles that push waste through your intestines to slow down.  You may develop hemorrhoids or swollen, bulging veins (varicose veins).  You may have pelvic pain because of the weight gain and pregnancy hormones relaxing your joints between the bones in your pelvis. Backaches may result from overexertion of the muscles supporting your posture.  You may have changes in your hair. These can include thickening of your hair, rapid growth, and changes in texture. Some women also have hair loss during or after pregnancy, or hair that feels dry or thin. Your hair will most likely return to normal after your baby is born.  Your breasts will continue to grow and be tender. A yellow discharge may leak from your breasts called colostrum.  Your belly button may stick out.  You may feel short of breath because of your expanding uterus.  You may notice the fetus "dropping," or moving lower in your abdomen.  You may have a bloody mucus discharge. This usually occurs a few days to a week before labor begins.  Your cervix becomes thin and soft (effaced) near your due date. WHAT TO EXPECT AT YOUR PRENATAL  EXAMS  You will have prenatal exams every 2 weeks until week 36. Then, you will have weekly prenatal exams. During a routine prenatal visit:  You will be weighed to make sure you and the fetus are growing normally.  Your blood pressure is taken.  Your abdomen will be measured to track your baby's growth.  The fetal heartbeat will be listened to.  Any test results from the previous visit will be discussed.  You may have a cervical check near your due date to see if you have effaced. At around 36 weeks, your caregiver will check your cervix. At the same time, your caregiver will also perform a test on the secretions of the vaginal tissue. This test is to determine if a type of bacteria, Group B streptococcus, is present. Your caregiver will explain this further. Your caregiver may ask you:  What your birth plan is.  How you are feeling.  If you are feeling the baby move.  If you have had any abnormal symptoms, such as leaking fluid, bleeding, severe headaches, or abdominal cramping.  If you have any questions. Other tests or screenings that may be performed during your third trimester include:  Blood tests that check for low iron levels (anemia).  Fetal testing to check the health, activity level, and growth of the fetus. Testing is done if you have certain medical conditions or if there are problems during the pregnancy. FALSE LABOR You may feel small, irregular contractions that   eventually go away. These are called Braxton Hicks contractions, or false labor. Contractions may last for hours, days, or even weeks before true labor sets in. If contractions come at regular intervals, intensify, or become painful, it is best to be seen by your caregiver.  SIGNS OF LABOR   Menstrual-like cramps.  Contractions that are 5 minutes apart or less.  Contractions that start on the top of the uterus and spread down to the lower abdomen and back.  A sense of increased pelvic pressure or back  pain.  A watery or bloody mucus discharge that comes from the vagina. If you have any of these signs before the 37th week of pregnancy, call your caregiver right away. You need to go to the hospital to get checked immediately. HOME CARE INSTRUCTIONS   Avoid all smoking, herbs, alcohol, and unprescribed drugs. These chemicals affect the formation and growth of the baby.  Follow your caregiver's instructions regarding medicine use. There are medicines that are either safe or unsafe to take during pregnancy.  Exercise only as directed by your caregiver. Experiencing uterine cramps is a good sign to stop exercising.  Continue to eat regular, healthy meals.  Wear a good support bra for breast tenderness.  Do not use hot tubs, steam rooms, or saunas.  Wear your seat belt at all times when driving.  Avoid raw meat, uncooked cheese, cat litter boxes, and soil used by cats. These carry germs that can cause birth defects in the baby.  Take your prenatal vitamins.  Try taking a stool softener (if your caregiver approves) if you develop constipation. Eat more high-fiber foods, such as fresh vegetables or fruit and whole grains. Drink plenty of fluids to keep your urine clear or pale yellow.  Take warm sitz baths to soothe any pain or discomfort caused by hemorrhoids. Use hemorrhoid cream if your caregiver approves.  If you develop varicose veins, wear support hose. Elevate your feet for 15 minutes, 3-4 times a day. Limit salt in your diet.  Avoid heavy lifting, wear low heal shoes, and practice good posture.  Rest a lot with your legs elevated if you have leg cramps or low back pain.  Visit your dentist if you have not gone during your pregnancy. Use a soft toothbrush to brush your teeth and be gentle when you floss.  A sexual relationship may be continued unless your caregiver directs you otherwise.  Do not travel far distances unless it is absolutely necessary and only with the approval  of your caregiver.  Take prenatal classes to understand, practice, and ask questions about the labor and delivery.  Make a trial run to the hospital.  Pack your hospital bag.  Prepare the baby's nursery.  Continue to go to all your prenatal visits as directed by your caregiver. SEEK MEDICAL CARE IF:  You are unsure if you are in labor or if your water has broken.  You have dizziness.  You have mild pelvic cramps, pelvic pressure, or nagging pain in your abdominal area.  You have persistent nausea, vomiting, or diarrhea.  You have a bad smelling vaginal discharge.  You have pain with urination. SEEK IMMEDIATE MEDICAL CARE IF:   You have a fever.  You are leaking fluid from your vagina.  You have spotting or bleeding from your vagina.  You have severe abdominal cramping or pain.  You have rapid weight loss or gain.  You have shortness of breath with chest pain.  You notice sudden or extreme swelling   of your face, hands, ankles, feet, or legs.  You have not felt your baby move in over an hour.  You have severe headaches that do not go away with medicine.  You have vision changes. Document Released: 11/22/2001 Document Revised: 12/03/2013 Document Reviewed: 01/29/2013 Exit[employer] Patient Information 2015 [employer]. This information is not intended to replace advice given to you by your health care provider. Make sure you discuss any questions you have with your health care provider.  

## 2014-07-02 ENCOUNTER — Encounter (HOSPITAL_COMMUNITY): Payer: Self-pay

## 2014-07-02 ENCOUNTER — Ambulatory Visit (HOSPITAL_COMMUNITY)
Admission: RE | Admit: 2014-07-02 | Discharge: 2014-07-02 | Disposition: A | Discharge status: Home / Self Care | Payer: Medicaid Other | Source: Ambulatory Visit | Attending: Obstetrics and Gynecology | Admitting: Obstetrics and Gynecology

## 2014-07-02 VITALS — BP 117/66 | HR 91 | Wt 184.0 lb

## 2014-07-02 DIAGNOSIS — Z3689 Encounter for other specified antenatal screening: Secondary | ICD-10-CM | POA: Insufficient documentation

## 2014-07-02 DIAGNOSIS — O358XX0 Maternal care for other (suspected) fetal abnormality and damage, not applicable or unspecified: Secondary | ICD-10-CM | POA: Diagnosis not present

## 2014-07-02 DIAGNOSIS — O359XX1 Maternal care for (suspected) fetal abnormality and damage, unspecified, fetus 1: Secondary | ICD-10-CM

## 2014-07-03 ENCOUNTER — Encounter: Payer: Medicaid Other | Admitting: Obstetrics & Gynecology

## 2014-07-03 ENCOUNTER — Encounter: Payer: Self-pay | Admitting: Obstetrics & Gynecology

## 2014-07-08 ENCOUNTER — Encounter: Payer: Self-pay | Admitting: Obstetrics & Gynecology

## 2014-07-08 ENCOUNTER — Encounter: Payer: Medicaid Other | Admitting: Obstetrics & Gynecology

## 2014-07-10 ENCOUNTER — Encounter (HOSPITAL_COMMUNITY): Payer: Self-pay

## 2014-07-10 ENCOUNTER — Inpatient Hospital Stay (HOSPITAL_COMMUNITY)
Admission: AD | Admit: 2014-07-10 | Discharge: 2014-07-14 | DRG: 775 | Disposition: A | Discharge status: Home / Self Care | Payer: Medicaid Other | Source: Ambulatory Visit | Attending: Family Medicine | Admitting: Family Medicine

## 2014-07-10 DIAGNOSIS — O99892 Other specified diseases and conditions complicating childbirth: Secondary | ICD-10-CM | POA: Diagnosis present

## 2014-07-10 DIAGNOSIS — Z22330 Carrier of Group B streptococcus: Secondary | ICD-10-CM | POA: Diagnosis not present

## 2014-07-10 DIAGNOSIS — O99344 Other mental disorders complicating childbirth: Secondary | ICD-10-CM | POA: Diagnosis present

## 2014-07-10 DIAGNOSIS — O99334 Smoking (tobacco) complicating childbirth: Secondary | ICD-10-CM | POA: Diagnosis present

## 2014-07-10 DIAGNOSIS — Z833 Family history of diabetes mellitus: Secondary | ICD-10-CM

## 2014-07-10 DIAGNOSIS — O99891 Other specified diseases and conditions complicating pregnancy: Secondary | ICD-10-CM | POA: Diagnosis present

## 2014-07-10 DIAGNOSIS — F1210 Cannabis abuse, uncomplicated: Secondary | ICD-10-CM | POA: Diagnosis present

## 2014-07-10 DIAGNOSIS — O42919 Preterm premature rupture of membranes, unspecified as to length of time between rupture and onset of labor, unspecified trimester: Secondary | ICD-10-CM

## 2014-07-10 DIAGNOSIS — O4290 Premature rupture of membranes, unspecified as to length of time between rupture and onset of labor, unspecified weeks of gestation: Principal | ICD-10-CM | POA: Diagnosis present

## 2014-07-10 DIAGNOSIS — O9989 Other specified diseases and conditions complicating pregnancy, childbirth and the puerperium: Secondary | ICD-10-CM

## 2014-07-10 LAB — POCT FERN TEST: POCT Fern Test: POSITIVE — AB

## 2014-07-10 NOTE — MAU Note (Signed)
Pt c/o LOF-clear since 8pm. Pt wearing a pad and has changed it 3 times since. Pt states some mild contractions. Pt has not felt movement since 7pm.

## 2014-07-11 ENCOUNTER — Encounter (HOSPITAL_COMMUNITY): Payer: Medicaid Other | Admitting: Anesthesiology

## 2014-07-11 ENCOUNTER — Encounter (HOSPITAL_COMMUNITY): Payer: Self-pay | Admitting: *Deleted

## 2014-07-11 ENCOUNTER — Inpatient Hospital Stay (HOSPITAL_COMMUNITY): Payer: Medicaid Other | Admitting: Anesthesiology

## 2014-07-11 DIAGNOSIS — O42919 Preterm premature rupture of membranes, unspecified as to length of time between rupture and onset of labor, unspecified trimester: Secondary | ICD-10-CM | POA: Diagnosis present

## 2014-07-11 LAB — CBC
HCT: 31.1 % — ABNORMAL LOW (ref 36.0–46.0)
Hemoglobin: 10.7 g/dL — ABNORMAL LOW (ref 12.0–15.0)
MCH: 30.7 pg (ref 26.0–34.0)
MCHC: 34.4 g/dL (ref 30.0–36.0)
MCV: 89.1 fL (ref 78.0–100.0)
Platelets: 256 10*3/uL (ref 150–400)
RBC: 3.49 MIL/uL — ABNORMAL LOW (ref 3.87–5.11)
RDW: 12.9 % (ref 11.5–15.5)
WBC: 8.4 10*3/uL (ref 4.0–10.5)

## 2014-07-11 LAB — RPR

## 2014-07-11 LAB — GROUP B STREP BY PCR: Group B strep by PCR: POSITIVE — AB

## 2014-07-11 LAB — OB RESULTS CONSOLE GBS: STREP GROUP B AG: POSITIVE

## 2014-07-11 MED ORDER — DIPHENHYDRAMINE HCL 50 MG/ML IJ SOLN: 12.5000 mg | INTRAMUSCULAR | Status: DC | PRN

## 2014-07-11 MED ORDER — LACTATED RINGERS IV SOLN
500.0000 mL | INTRAVENOUS | Status: DC | PRN
  Administered 2014-07-11 (×3): 300 mL via INTRAVENOUS

## 2014-07-11 MED ORDER — TERBUTALINE SULFATE 1 MG/ML IJ SOLN: 0.2500 mg | Freq: Once | INTRAMUSCULAR | Status: AC | PRN

## 2014-07-11 MED ORDER — EPHEDRINE 5 MG/ML INJ
10.0000 mg | INTRAVENOUS | Status: DC | PRN
  Filled 2014-07-11: qty 2

## 2014-07-11 MED ORDER — LIDOCAINE HCL (PF) 1 % IJ SOLN
30.0000 mL | INTRAMUSCULAR | Status: DC | PRN
  Filled 2014-07-11: qty 30

## 2014-07-11 MED ORDER — OXYTOCIN BOLUS FROM INFUSION
500.0000 mL | INTRAVENOUS | Status: DC
  Administered 2014-07-12: 500 mL via INTRAVENOUS

## 2014-07-11 MED ORDER — LACTATED RINGERS IV SOLN
INTRAVENOUS | Status: DC
  Administered 2014-07-11 (×3): via INTRAVENOUS

## 2014-07-11 MED ORDER — PHENYLEPHRINE 40 MCG/ML (10ML) SYRINGE FOR IV PUSH (FOR BLOOD PRESSURE SUPPORT)
80.0000 ug | PREFILLED_SYRINGE | INTRAVENOUS | Status: DC | PRN
  Filled 2014-07-11: qty 2
  Filled 2014-07-11: qty 10

## 2014-07-11 MED ORDER — LIDOCAINE HCL (PF) 1 % IJ SOLN
INTRAMUSCULAR | Status: DC | PRN
  Administered 2014-07-11 (×4): 4 mL

## 2014-07-11 MED ORDER — OXYTOCIN 40 UNITS IN LACTATED RINGERS INFUSION - SIMPLE MED
1.0000 m[IU]/min | INTRAVENOUS | Status: DC
  Administered 2014-07-11: 2 m[IU]/min via INTRAVENOUS
  Filled 2014-07-11: qty 1000

## 2014-07-11 MED ORDER — FENTANYL 2.5 MCG/ML BUPIVACAINE 1/10 % EPIDURAL INFUSION (WH - ANES)
14.0000 mL/h | INTRAMUSCULAR | Status: DC | PRN
  Administered 2014-07-11 (×2): 14 mL/h via EPIDURAL
  Filled 2014-07-11 (×2): qty 125

## 2014-07-11 MED ORDER — PHENYLEPHRINE 40 MCG/ML (10ML) SYRINGE FOR IV PUSH (FOR BLOOD PRESSURE SUPPORT)
80.0000 ug | PREFILLED_SYRINGE | INTRAVENOUS | Status: DC | PRN
  Filled 2014-07-11: qty 2

## 2014-07-11 MED ORDER — PENICILLIN G POTASSIUM 5000000 UNITS IJ SOLR
5.0000 10*6.[IU] | Freq: Once | INTRAVENOUS | Status: AC
  Administered 2014-07-11: 5 10*6.[IU] via INTRAVENOUS
  Filled 2014-07-11: qty 5

## 2014-07-11 MED ORDER — OXYTOCIN 40 UNITS IN LACTATED RINGERS INFUSION - SIMPLE MED: 62.5000 mL/h | INTRAVENOUS | Status: DC

## 2014-07-11 MED ORDER — IBUPROFEN 600 MG PO TABS: 600.0000 mg | ORAL_TABLET | Freq: Four times a day (QID) | ORAL | Status: DC | PRN

## 2014-07-11 MED ORDER — OXYCODONE-ACETAMINOPHEN 5-325 MG PO TABS: 1.0000 | ORAL_TABLET | ORAL | Status: DC | PRN

## 2014-07-11 MED ORDER — LACTATED RINGERS IV SOLN
INTRAVENOUS | Status: DC
  Administered 2014-07-11: 18:00:00 via INTRAUTERINE

## 2014-07-11 MED ORDER — ONDANSETRON HCL 4 MG/2ML IJ SOLN: 4.0000 mg | Freq: Four times a day (QID) | INTRAMUSCULAR | Status: DC | PRN

## 2014-07-11 MED ORDER — LACTATED RINGERS IV SOLN
500.0000 mL | Freq: Once | INTRAVENOUS | Status: AC
  Administered 2014-07-11: 500 mL via INTRAVENOUS

## 2014-07-11 MED ORDER — DEXTROSE 5 % IV SOLN
2.5000 10*6.[IU] | INTRAVENOUS | Status: DC
  Administered 2014-07-11 (×5): 2.5 10*6.[IU] via INTRAVENOUS
  Filled 2014-07-11 (×10): qty 2.5

## 2014-07-11 MED ORDER — CITRIC ACID-SODIUM CITRATE 334-500 MG/5ML PO SOLN: 30.0000 mL | ORAL | Status: DC | PRN

## 2014-07-11 MED ORDER — ACETAMINOPHEN 325 MG PO TABS: 650.0000 mg | ORAL_TABLET | ORAL | Status: DC | PRN
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
## 2014-07-11 NOTE — Progress Notes (Signed)
   Subjective: Pt reports increasing contractions, but tolerable.  Denies constant abdominal pain.   Objective: BP 94/62  Pulse 73  Temp(Src) 98.5 F (36.9 C) (Oral)  Resp 18  Ht 5' 6.8" (1.697 m)  Wt 81.557 kg (179 lb 12.8 oz)  BMI 28.32 kg/m2  SpO2 100%  LMP 11/08/2013      FHT:  FHR: 130's bpm, variability: moderate,  accelerations:  Present,  decelerations:  Absent UC:   irregular, every 3-5 minutes Dilation: 3.5 Effacement (%): 70 Station: -3 Presentation: Vertex Exam by:: Loreta AveAcosta, MD   Labs: Lab Results  Component Value Date   WBC 8.4 07/10/2014   HGB 10.7* 07/10/2014   HCT 31.1* 07/10/2014   MCV 89.1 07/10/2014   PLT 256 07/10/2014    Assessment / Plan: 28 yo G1P0 at 5114w0d wks IUP PPROM - afebrile  Labor: Augmentation of labor Preeclampsia:  n/a Fetal Wellbeing:  Category I Pain Control:  Labor support without medications I/D:  GBS pos Anticipated MOD:  NSVD  Christy West ROCIO 07/11/2014, 1:28 PM

## 2014-07-11 NOTE — Progress Notes (Signed)
Thurston HoleDiamond M Kwiecinski is a 28 y.o. G1P0 at 4068w0d by ultrasound admitted for PPROM  Subjective: Patient has no pain yet. Leg is numb from epidural. Notes contractions, but not uncomfortable. No fevers.  Objective: BP 102/69  Pulse 70  Temp(Src) 97.4 F (36.3 C) (Oral)  Resp 18  Ht 5' 6.8" (1.697 m)  Wt 81.557 kg (179 lb 12.8 oz)  BMI 28.32 kg/m2  SpO2 100%  LMP 11/08/2013 I/O last 3 completed shifts: In: -  Out: 300 [Urine:300]    FHT:  FHR: 140 bpm, variability: moderate,  accelerations:  Present,  decelerations:  Absent UC:   regular, every 2-3 minutes SVE:   Dilation: 5 Effacement (%): 70 Station: 0 Exam by:: Larose KellsK Fraley RN  Labs: Lab Results  Component Value Date   WBC 8.4 07/10/2014   HGB 10.7* 07/10/2014   HCT 31.1* 07/10/2014   MCV 89.1 07/10/2014   PLT 256 07/10/2014    Assessment / Plan: Augmentation of labor, progressing well  Labor: Progressing normally Preeclampsia:  no signs or symptoms of toxicity Fetal Wellbeing:  Category I Pain Control:  Epidural I/D:  On penicillin for GBS positive nad PPROM Anticipated MOD:  NSVD  Patient evaluated with Dwyane DeeMarie Brandon Wiechman  Stephens, Devin A 07/11/2014, 9:26 PM  Seen and agree Aviva SignsMarie L Joana Nolton, CNM

## 2014-07-11 NOTE — Progress Notes (Signed)
   Subjective: Pt reports increasing contractions, but tolerable.  Denies constant abdominal pain.   Objective: BP 94/51  Pulse 70  Temp(Src) 98.3 F (36.8 C) (Oral)  Resp 18  Ht 5' 6.8" (1.697 m)  Wt 81.557 kg (179 lb 12.8 oz)  BMI 28.32 kg/m2  SpO2 100%  LMP 11/08/2013      FHT:  FHR: 130's bpm, variability: moderate,  accelerations:  Present,  decelerations:  Absent UC:   regular, every 3-5 minutes SVE:   Dilation: 3 Effacement (%): 70 Station: -1 Exam by:: Margarita MailW. Karim, CNM IUPC placed without difficulty  Labs: Lab Results  Component Value Date   WBC 8.4 07/10/2014   HGB 10.7* 07/10/2014   HCT 31.1* 07/10/2014   MCV 89.1 07/10/2014   PLT 256 07/10/2014    Assessment / Plan: 28 yo G1P0 at 3752w0d wks IUP PPROM - afebrile  Labor: Augmentation of labor Preeclampsia:  n/a Fetal Wellbeing:  Category I Pain Control:  Labor support without medications I/D:  GBS pos Anticipated MOD:  NSVD  KARIM, WALIDAH N 07/11/2014, 8:56 AM

## 2014-07-11 NOTE — Anesthesia Procedure Notes (Signed)
Epidural Patient location during procedure: OB Start time: 07/11/2014 3:10 PM  Staffing Performed by: anesthesiologist   Preanesthetic Checklist Completed: patient identified, site marked, surgical consent, pre-op evaluation, timeout performed, IV checked, risks and benefits discussed and monitors and equipment checked  Epidural Patient position: sitting Prep: site prepped and draped and DuraPrep Patient monitoring: continuous pulse ox and blood pressure Approach: midline Injection technique: LOR air  Needle:  Needle type: Tuohy  Needle gauge: 17 G Needle length: 9 cm and 9 Needle insertion depth: 4 cm Catheter type: closed end flexible Catheter size: 19 Gauge Catheter at skin depth: 9 cm Test dose: negative  Assessment Events: blood not aspirated, injection not painful, no injection resistance, negative IV test and no paresthesia  Additional Notes Discussed risk of headache, infection, bleeding, nerve injury and failed or incomplete block.  Patient voices understanding and wishes to proceed.  Epidural placed easily on first attempt.  No paresthesia.  Patient tolerated procedure well with no apparent complications.  Jasmine DecemberA. Lincoln Ginley, MDReason for block:procedure for pain

## 2014-07-11 NOTE — Anesthesia Preprocedure Evaluation (Signed)
Anesthesia Evaluation  Patient identified by MRN, date of birth, ID band Patient awake    Reviewed: Allergy & Precautions, H&P , NPO status , Patient's Chart, lab work & pertinent test results, reviewed documented beta blocker date and time   History of Anesthesia Complications Negative for: history of anesthetic complications  Airway Mallampati: III TM Distance: >3 FB Neck ROM: full    Dental  (+) Teeth Intact   Pulmonary neg pulmonary ROS, Current Smoker,  breath sounds clear to auscultation        Cardiovascular negative cardio ROS  Rhythm:regular Rate:Normal     Neuro/Psych negative neurological ROS  negative psych ROS   GI/Hepatic negative GI ROS, (+)       marijuana use,   Endo/Other  negative endocrine ROS  Renal/GU negative Renal ROS     Musculoskeletal   Abdominal   Peds  Hematology negative hematology ROS (+)   Anesthesia Other Findings   Reproductive/Obstetrics (+) Pregnancy                           Anesthesia Physical Anesthesia Plan  ASA: II  Anesthesia Plan: Epidural   Post-op Pain Management:    Induction:   Airway Management Planned:   Additional Equipment:   Intra-op Plan:   Post-operative Plan:   Informed Consent: I have reviewed the patients History and Physical, chart, labs and discussed the procedure including the risks, benefits and alternatives for the proposed anesthesia with the patient or authorized representative who has indicated his/her understanding and acceptance.     Plan Discussed with:   Anesthesia Plan Comments:         Anesthesia Quick Evaluation

## 2014-07-11 NOTE — Progress Notes (Signed)
Loreta AveAcosta, MD, notified of patient receiving epidural. Orders received to place foley catheter if patient has not voided in 4 hours post epidural.

## 2014-07-11 NOTE — Progress Notes (Signed)
   Christy West is a 28 y.o. G1P0 at 3837w0d  admitted for preterm premature rupture of membranes  Subjective:  Feeling contractions, mild Objective: Filed Vitals:   07/11/14 0016 07/11/14 0126 07/11/14 0200 07/11/14 0328  BP: 105/68 100/60  111/71  Pulse: 96 74  77  Temp: 98.4 F (36.9 C)  98.3 F (36.8 C) 98.6 F (37 C)  TempSrc: Oral  Oral Oral  Resp: 18   18  Height:      Weight:      SpO2:          FHT:  FHR: 130 bpm, variability: moderate,  accelerations:  Present,  decelerations:  Absent UC:   irregular, every 2-5 minutes SVE:   Deferred Pitocin @ 8 mu/min  Labs: Lab Results  Component Value Date   WBC 8.4 07/10/2014   HGB 10.7* 07/10/2014   HCT 31.1* 07/10/2014   MCV 89.1 07/10/2014   PLT 256 07/10/2014    Assessment / Plan: IOL for PPROM, beginning to contract  Labor: Progressing normally Fetal Wellbeing:  Category I Pain Control:  Labor support without medications Anticipated MOD:  NSVD  CRESENZO-DISHMAN,Carmel Garfield 07/11/2014, 5:38 AM

## 2014-07-12 ENCOUNTER — Encounter (HOSPITAL_COMMUNITY): Payer: Self-pay | Admitting: Obstetrics

## 2014-07-12 DIAGNOSIS — O4290 Premature rupture of membranes, unspecified as to length of time between rupture and onset of labor, unspecified weeks of gestation: Secondary | ICD-10-CM

## 2014-07-12 DIAGNOSIS — F1210 Cannabis abuse, uncomplicated: Secondary | ICD-10-CM

## 2014-07-12 DIAGNOSIS — O99344 Other mental disorders complicating childbirth: Secondary | ICD-10-CM

## 2014-07-12 MED ORDER — ONDANSETRON HCL 4 MG PO TABS: 4.0000 mg | ORAL_TABLET | ORAL | Status: DC | PRN

## 2014-07-12 MED ORDER — OXYCODONE-ACETAMINOPHEN 5-325 MG PO TABS: 1.0000 | ORAL_TABLET | ORAL | Status: DC | PRN

## 2014-07-12 MED ORDER — PRENATAL MULTIVITAMIN CH
1.0000 | ORAL_TABLET | Freq: Every day | ORAL | Status: DC
  Administered 2014-07-12 – 2014-07-14 (×3): 1 via ORAL
  Filled 2014-07-12 (×3): qty 1

## 2014-07-12 MED ORDER — ZOLPIDEM TARTRATE 5 MG PO TABS: 5.0000 mg | ORAL_TABLET | Freq: Every evening | ORAL | Status: DC | PRN

## 2014-07-12 MED ORDER — SIMETHICONE 80 MG PO CHEW
80.0000 mg | CHEWABLE_TABLET | ORAL | Status: DC | PRN
Start: 2014-07-12 — End: 2014-07-14

## 2014-07-12 MED ORDER — BENZOCAINE-MENTHOL 20-0.5 % EX AERO
1.0000 | INHALATION_SPRAY | CUTANEOUS | Status: DC | PRN
Start: 2014-07-12 — End: 2014-07-14

## 2014-07-12 MED ORDER — IBUPROFEN 600 MG PO TABS
600.0000 mg | ORAL_TABLET | Freq: Four times a day (QID) | ORAL | Status: DC
  Administered 2014-07-12 – 2014-07-14 (×10): 600 mg via ORAL
  Filled 2014-07-12 (×10): qty 1

## 2014-07-12 MED ORDER — LANOLIN HYDROUS EX OINT: TOPICAL_OINTMENT | CUTANEOUS | Status: DC | PRN

## 2014-07-12 MED ORDER — DIPHENHYDRAMINE HCL 25 MG PO CAPS: 25.0000 mg | ORAL_CAPSULE | Freq: Four times a day (QID) | ORAL | Status: DC | PRN

## 2014-07-12 MED ORDER — WITCH HAZEL-GLYCERIN EX PADS: 1.0000 "application " | MEDICATED_PAD | CUTANEOUS | Status: DC | PRN

## 2014-07-12 MED ORDER — TETANUS-DIPHTH-ACELL PERTUSSIS 5-2.5-18.5 LF-MCG/0.5 IM SUSP: 0.5000 mL | Freq: Once | INTRAMUSCULAR | Status: DC

## 2014-07-12 MED ORDER — SENNOSIDES-DOCUSATE SODIUM 8.6-50 MG PO TABS
2.0000 | ORAL_TABLET | ORAL | Status: DC
  Filled 2014-07-12: qty 2

## 2014-07-12 MED ORDER — ONDANSETRON HCL 4 MG/2ML IJ SOLN: 4.0000 mg | INTRAMUSCULAR | Status: DC | PRN

## 2014-07-12 MED ORDER — DIBUCAINE 1 % RE OINT: 1.0000 "application " | TOPICAL_OINTMENT | RECTAL | Status: DC | PRN

## 2014-07-12 NOTE — Anesthesia Postprocedure Evaluation (Signed)
Anesthesia Post Note  [facility]  Procedure(s) Performed: * No procedures listed *  Anesthesia type: Epidural  Patient location: Mother/Baby  Post pain: Pain level controlled  Post assessment: Post-op Vital signs reviewed  Last Vitals:  Filed Vitals:   07/12/14 0636  BP: 105/65  Pulse: 81  Temp: 36.9 C  Resp: 18    Post vital signs: Reviewed  Level of consciousness:alert  Complications: No apparent anesthesia complications

## 2014-07-12 NOTE — Lactation Note (Signed)
This note was copied from the chart of Christy Norvel RichardsDiamond Lampe. Lactation Consultation Note Initial visit at 16 hours of age.  Mom has a DEBP at bedside and reports pumping at 1400 for 10 minutes and did not collect anything.  Encouraged mom that this is normal and instructed on preemie setting for 15 minutes every 3 hours with feedings.  Mom reports she knows how to do hand expression and encouraged after pumping for collection of EBM.  Baby is already supplemented with formula.  Written supplementation guidelines for LPT given and discussed.  Mom is able to list early feeding cues and is holding sleeping baby up to her chest now.  [facility] resources given and discussed.  Encouraged to feed with early cues on demand.  Early newborn and LPT  behavior discussed. Mom identifies her sister as support for breastfeeding whom has experience herself.   Mom denies any concerns at this time.  Mom to call for assist as needed.    Patient Name: Christy West ZOXWR'UToday's Date: 07/12/2014 Reason for consult: Initial assessment   Maternal Data Has patient been taught Hand Expression?: Yes Does the patient have breastfeeding experience prior to this delivery?: No  Feeding    LATCH Score/Interventions                      Lactation Tools Discussed/Used Pump Review: Setup, frequency, and cleaning   Consult Status Consult Status: Follow-up Date: 07/13/14 Follow-up type: In-patient    [name], [name] 07/12/2014, 4:40 PM

## 2014-07-12 NOTE — Progress Notes (Signed)
PT out in hallway walking up to desk with infant under hospital gown. Pt informed of policy of not carrying infant in arms or hands in hallway. Pt back to room. RN IN ROOM AND INFORMED PT OF NOT CARRYING INFANT IN ARMS. PT  STATED "I know she just told me, all I wanted was a pencil". RN informed pt of policy of being able to ambulate in hallway with infant in crib. RN informed pt if she needed anything to please put call light on or she could ambulate in hallway with infant in crib for infant security sake. Pt stated -"OK"

## 2014-07-12 NOTE — H&P (Signed)
Attestation of Attending Supervision of Advanced Practitioner: Evaluation and management procedures were performed by the PA/NP/CNM/OB Fellow under my supervision/collaboration. Chart reviewed and agree with management and plan.  Greycen Felter V 07/12/2014 3:31 PM

## 2014-07-13 NOTE — Lactation Note (Signed)
This note was copied from the chart of Christy West. Lactation Consultation Note Follow up visit at 41 hours of age.  Baby is [redacted]W[redacted]D CGA.  Baby has had 8 breatfeeding in the past 24 hours and 3 voids and 3 stools.  Last void noted to be >15 hrs ago.  Mom is unsure if baby has voided with stooled diapers, she is using preemie and they do not have a wet indicator stripe.  Encouraged mom to save changed diapers for nurse to record if needed.  Baby had a 5%weight loss with 1st weight check at about 24 hours old.  Encouraged mom to continue to supplement with breast feedings Pregestimil formula to increase calories.  Mom reports baby wont take the bottle.  I assisted with bottle feeding and when nipple was adequately in baby's mouth the baby quickly took 10mls of formula.  Demonstrated and instructed on bottle feeding, but mom may need reinforcement.  Discussed normal LPT behavior.  Mom has not pumped since last evening due to not collection milk.  Encouraged mom to pump every 3 hours to increase her milk supply.  She plans to supplement with formula every other feeding and to post pump every other feeding.   Mom is alone, but expecting visitors.  Encouragement provided. Mom to call for assist as needed.    Patient Name: Christy Norvel RichardsDiamond Mcvay LOVFI'EToday's Date: 07/13/2014 Reason for consult: Follow-up assessment;Infant < 6lbs;Late preterm infant   Maternal Data    Feeding Feeding Type: Breast Fed Length of feed: 12 min  LATCH Score/Interventions                      Lactation Tools Discussed/Used     Consult Status Consult Status: Follow-up Date: 07/14/14 Follow-up type: In-patient    Beverely RisenShoptaw, Arvella MerlesJana Lynn 07/13/2014, 5:45 PM

## 2014-07-13 NOTE — Progress Notes (Signed)
28 year old G1P0101 admitted for PPROM now s/p vaginal delivery at 9093w1d.  Post Partum Day 1 Subjective: no complaints, up ad lib, voiding and tolerating PO. Bleeding is minimal. Baby to have cranial ultrasound today.   Objective: Blood pressure 112/62, pulse 69, temperature 97.7 F (36.5 C), temperature source Oral, resp. rate 17, height 5' 6.8" (1.697 m), weight 81.557 kg (179 lb 12.8 oz), last menstrual period 11/08/2013, SpO2 100.00%, unknown if currently breastfeeding.  Physical Exam:  General: alert, cooperative and no distress Lochia: appropriate Uterine Fundus: firm Incision: N/A DVT Evaluation: No evidence of DVT seen on physical exam. Negative Homan's sign. No cords or calf tenderness.   Recent Labs  07/10/14 2340  HGB 10.7*  HCT 31.1*    Assessment/Plan: Plan for discharge tomorrow Plans on using Depo Provera for contraception  Patient seen with Wynelle BourgeoisMarie Daiya Tamer   LOS: 3 days   Teodora MediciStephens, Devin A 07/13/2014, 6:30 AM   Seen also by me Agree with note Aviva SignsMarie L Chia Mowers, CNM

## 2014-07-14 ENCOUNTER — Ambulatory Visit: Payer: Self-pay

## 2014-07-14 MED ORDER — IBUPROFEN 600 MG PO TABS: 600.0000 mg | ORAL_TABLET | Freq: Four times a day (QID) | ORAL | Status: DC

## 2014-07-14 NOTE — Discharge Instructions (Signed)

## 2014-07-14 NOTE — Discharge Summary (Signed)
Obstetric Discharge Summary Reason for Admission: rupture of membranes.  PPROM at 35 wks. Prenatal Procedures: ultrasound Intrapartum Procedures: spontaneous vaginal delivery and GBS prophylaxis Postpartum Procedures: none Complications-Operative and Postpartum: none Hemoglobin  Date Value Ref Range Status  07/10/2014 10.7* 12.0 - 15.0 g/dL Final     HCT  Date Value Ref Range Status  07/10/2014 31.1* 36.0 - 46.0 % Final   Post Partum Day 2 Subjective: no complaints, up ad lib, voiding and tolerating PO.  Breastfeeding well.  Reports decreased bleeding and pain.  Desires discharge to home.  Depo for family planning.  Physical Exam:  Filed Vitals:   07/13/14 1759  BP: 108/60  Pulse: 65  Temp: 98.3 F (36.8 C)  Resp: 18   General: alert, cooperative and appears stated age Lochia: appropriate Uterine Fundus: firm Incision: n/a DVT Evaluation: No evidence of DVT seen on physical exam. Negative Homan's sign.  Discharge Diagnoses: Preterm Premature Rupture of Membranes  Discharge Information: Date: 07/14/2014 Activity: pelvic rest Diet: routine Medications: Ibuprofen Condition: stable Instructions: refer to practice specific booklet Discharge to: home Follow-up Information   [facility]   Contact information:   [address] [place] [id number] [phone]      Newborn Data: Live born female  Birth Weight: 5 lb 2.7 oz (2345 g) APGAR: 8, 9  Home with mother.  Rochele PagesKARIM, Dareon Nunziato N 07/14/2014, 7:38 AM

## 2014-07-14 NOTE — Progress Notes (Signed)
UR chart review completed.  

## 2014-07-14 NOTE — Lactation Note (Signed)
This note was copied from the chart of Christy Norvel RichardsDiamond Runnion. Lactation Consultation Note  Patient Name: Christy West: 07/14/2014 Reason for consult: Follow-up assessment;Infant < 6lbs;Late preterm infant Mom reports baby has been breastfeeding today every 1-3 hours. She has not been supplementing today and pumped 1 time receiving a few drops. Baby now under photo therapy. Reviewed LPT behaviors and reviewed feeding plan. Discussed with Mom the importance of supplementing to support baby's calories/energy, minimize weight loss and encourage milk production for Mom. Baby at 9% weight loss. Reviewed with Mom how supplementing can help with jaundice. Advised Mom to BF each feeding limiting time at breast to 30 minutes, supplement with EBM/formula per LPT guidelines, then post pump to encourage milk production. Mom reports some leaking from breasts. Encouraged Mom to call for LC to observe feeding. Advised Mom to call WIC in the AM for pump at d/c if available.   Maternal Data    Feeding Feeding Type: Formula Nipple Type: Slow - flow Length of feed: 15 min  LATCH Score/Interventions                      Lactation Tools Discussed/Used     Consult Status Consult Status: Follow-up West: 07/15/14 Follow-up type: In-patient    Christy West, Christy West 07/14/2014, 4:55 PM

## 2014-07-15 NOTE — Discharge Summary (Signed)
Attestation of Attending Supervision of Advanced Practitioner (CNM/NP): Evaluation and management procedures were performed by the Advanced Practitioner under my supervision and collaboration.  I have reviewed the Advanced Practitioner's note and chart, and I agree with the management and plan.  Kember Boch 07/15/2014 10:54 AM   

## 2014-07-22 ENCOUNTER — Encounter: Payer: Medicaid Other | Admitting: Obstetrics and Gynecology

## 2014-07-29 ENCOUNTER — Encounter (HOSPITAL_COMMUNITY): Payer: Self-pay | Admitting: *Deleted

## 2014-07-30 ENCOUNTER — Ambulatory Visit (HOSPITAL_COMMUNITY): Payer: Medicaid Other

## 2014-08-01 ENCOUNTER — Encounter: Payer: Self-pay | Admitting: General Practice

## 2014-08-07 ENCOUNTER — Encounter: Payer: Self-pay | Admitting: Obstetrics & Gynecology

## 2014-08-22 ENCOUNTER — Ambulatory Visit: Payer: Medicaid Other | Admitting: Obstetrics & Gynecology

## 2014-08-22 ENCOUNTER — Encounter: Payer: Self-pay | Admitting: Obstetrics & Gynecology

## 2014-09-25 ENCOUNTER — Emergency Department (HOSPITAL_COMMUNITY)
Admission: EM | Admit: 2014-09-25 | Discharge: 2014-09-25 | Disposition: A | Discharge status: Home / Self Care | Payer: Medicaid Other | Attending: Emergency Medicine | Admitting: Emergency Medicine

## 2014-09-25 ENCOUNTER — Encounter (HOSPITAL_COMMUNITY): Payer: Self-pay | Admitting: Emergency Medicine

## 2014-09-25 ENCOUNTER — Emergency Department (HOSPITAL_COMMUNITY): Payer: Medicaid Other

## 2014-09-25 DIAGNOSIS — Y92410 Unspecified street and highway as the place of occurrence of the external cause: Secondary | ICD-10-CM | POA: Diagnosis not present

## 2014-09-25 DIAGNOSIS — Z720 Tobacco use: Secondary | ICD-10-CM | POA: Insufficient documentation

## 2014-09-25 DIAGNOSIS — Y9389 Activity, other specified: Secondary | ICD-10-CM | POA: Diagnosis not present

## 2014-09-25 DIAGNOSIS — Z8742 Personal history of other diseases of the female genital tract: Secondary | ICD-10-CM | POA: Diagnosis not present

## 2014-09-25 DIAGNOSIS — M25562 Pain in left knee: Secondary | ICD-10-CM

## 2014-09-25 DIAGNOSIS — S3992XA Unspecified injury of lower back, initial encounter: Secondary | ICD-10-CM | POA: Insufficient documentation

## 2014-09-25 DIAGNOSIS — M545 Low back pain, unspecified: Secondary | ICD-10-CM

## 2014-09-25 DIAGNOSIS — S8991XA Unspecified injury of right lower leg, initial encounter: Secondary | ICD-10-CM | POA: Insufficient documentation

## 2014-09-25 MED ORDER — TRAMADOL HCL 50 MG PO TABS
50.0000 mg | ORAL_TABLET | Freq: Once | ORAL | Status: AC
  Administered 2014-09-25: 50 mg via ORAL
  Filled 2014-09-25: qty 1

## 2014-09-25 MED ORDER — METHOCARBAMOL 500 MG PO TABS: 1000.0000 mg | ORAL_TABLET | Freq: Four times a day (QID) | ORAL | Status: DC

## 2014-09-25 MED ORDER — NAPROXEN 500 MG PO TABS: 500.0000 mg | ORAL_TABLET | Freq: Two times a day (BID) | ORAL | Status: DC
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
## 2014-09-25 NOTE — ED Notes (Signed)
Pt ambulatory in hall able to bear weight and gait stteady

## 2014-09-25 NOTE — ED Notes (Signed)
Pt to xray
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
## 2014-09-25 NOTE — ED Notes (Signed)
Returned from xray
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
## 2014-09-28 NOTE — ED Provider Notes (Signed)
Medical screening examination/treatment/procedure(s) were performed by non-physician practitioner and as supervising physician I was immediately available for consultation/collaboration.   EKG Interpretation None       Ashden Sonnenberg, MD 09/28/14 0907 

## 2014-10-11 ENCOUNTER — Inpatient Hospital Stay (HOSPITAL_COMMUNITY)
Admission: AD | Admit: 2014-10-11 | Discharge: 2014-10-11 | Disposition: A | Discharge status: Home / Self Care | Payer: Medicaid Other | Source: Ambulatory Visit | Attending: Obstetrics and Gynecology | Admitting: Obstetrics and Gynecology

## 2014-10-11 ENCOUNTER — Inpatient Hospital Stay (HOSPITAL_COMMUNITY): Payer: Medicaid Other

## 2014-10-11 ENCOUNTER — Encounter (HOSPITAL_COMMUNITY): Payer: Self-pay | Admitting: *Deleted

## 2014-10-11 DIAGNOSIS — N939 Abnormal uterine and vaginal bleeding, unspecified: Secondary | ICD-10-CM | POA: Diagnosis not present

## 2014-10-11 DIAGNOSIS — Z3A01 Less than 8 weeks gestation of pregnancy: Secondary | ICD-10-CM | POA: Diagnosis not present

## 2014-10-11 DIAGNOSIS — O26891 Other specified pregnancy related conditions, first trimester: Secondary | ICD-10-CM | POA: Diagnosis not present

## 2014-10-11 DIAGNOSIS — O209 Hemorrhage in early pregnancy, unspecified: Secondary | ICD-10-CM

## 2014-10-11 LAB — URINALYSIS, ROUTINE W REFLEX MICROSCOPIC
BILIRUBIN URINE: NEGATIVE
Glucose, UA: NEGATIVE mg/dL
Ketones, ur: NEGATIVE mg/dL
Nitrite: NEGATIVE
PH: 7 (ref 5.0–8.0)
Protein, ur: NEGATIVE mg/dL
Specific Gravity, Urine: 1.01 (ref 1.005–1.030)
Urobilinogen, UA: 0.2 mg/dL (ref 0.0–1.0)

## 2014-10-11 LAB — URINE MICROSCOPIC-ADD ON

## 2014-10-11 LAB — CBC
HCT: 32.2 % — ABNORMAL LOW (ref 36.0–46.0)
Hemoglobin: 11.4 g/dL — ABNORMAL LOW (ref 12.0–15.0)
MCH: 30.4 pg (ref 26.0–34.0)
MCHC: 35.4 g/dL (ref 30.0–36.0)
MCV: 85.9 fL (ref 78.0–100.0)
PLATELETS: 280 10*3/uL (ref 150–400)
RBC: 3.75 MIL/uL — AB (ref 3.87–5.11)
RDW: 12.3 % (ref 11.5–15.5)
WBC: 5.5 10*3/uL (ref 4.0–10.5)

## 2014-10-11 LAB — WET PREP, GENITAL
Trich, Wet Prep: NONE SEEN
Yeast Wet Prep HPF POC: NONE SEEN

## 2014-10-11 LAB — OB RESULTS CONSOLE GC/CHLAMYDIA
Chlamydia: NEGATIVE
Gonorrhea: NEGATIVE

## 2014-10-11 LAB — HCG, QUANTITATIVE, PREGNANCY: hCG, Beta Chain, Quant, S: 122300 m[IU]/mL — ABNORMAL HIGH (ref <5)

## 2014-10-11 LAB — POCT PREGNANCY, URINE: PREG TEST UR: POSITIVE — AB

## 2014-10-11 NOTE — Discharge Instructions (Signed)
Vaginal Bleeding During Pregnancy, First Trimester  A small amount of bleeding (spotting) from the vagina is relatively common in early pregnancy. It usually stops on its own. Various things may cause bleeding or spotting in early pregnancy. Some bleeding may be related to the pregnancy, and some may not. In most cases, the bleeding is normal and is not a problem. However, bleeding can also be a sign of something serious. Be sure to tell your health care provider about any vaginal bleeding right away.  Some possible causes of vaginal bleeding during the first trimester include:  · Infection or inflammation of the cervix.  · Growths (polyps) on the cervix.  · Miscarriage or threatened miscarriage.  · Pregnancy tissue has developed outside of the uterus and in a fallopian tube (tubal pregnancy).  · Tiny cysts have developed in the uterus instead of pregnancy tissue (molar pregnancy).  HOME CARE INSTRUCTIONS   Watch your condition for any changes. The following actions may help to lessen any discomfort you are feeling:  · Follow your health care provider's instructions for limiting your activity. If your health care provider orders bed rest, you may need to stay in bed and only get up to use the bathroom. However, your health care provider may allow you to continue light activity.  · If needed, make plans for someone to help with your regular activities and responsibilities while you are on bed rest.  · Keep track of the number of pads you use each day, how often you change pads, and how soaked (saturated) they are. Write this down.  · Do not use tampons. Do not douche.  · Do not have sexual intercourse or orgasms until approved by your health care provider.  · If you pass any tissue from your vagina, save the tissue so you can show it to your health care provider.  · Only take over-the-counter or prescription medicines as directed by your health care provider.  · Do not take aspirin because it can make you  bleed.  · Keep all follow-up appointments as directed by your health care provider.  SEEK MEDICAL CARE IF:  · You have any vaginal bleeding during any part of your pregnancy.  · You have cramps or labor pains.  · You have a fever, not controlled by medicine.  SEEK IMMEDIATE MEDICAL CARE IF:   · You have severe cramps in your back or belly (abdomen).  · You pass large clots or tissue from your vagina.  · Your bleeding increases.  · You feel light-headed or weak, or you have fainting episodes.  · You have chills.  · You are leaking fluid or have a gush of fluid from your vagina.  · You pass out while having a bowel movement.  MAKE SURE YOU:  · Understand these instructions.  · Will watch your condition.  · Will get help right away if you are not doing well or get worse.  Document Released: 09/07/2005 Document Revised: 12/03/2013 Document Reviewed: 08/05/2013  ExitCare® Patient Information ©2015 [employer]. This information is not intended to replace advice given to you by your health care provider. Make sure you discuss any questions you have with your health care provider.
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
## 2014-10-11 NOTE — MAU Note (Addendum)
Was feeling nauseated and change in appetite so did pregnancy test and it was positive. Had vag delivery 07/12/14. Spotting some now. Unsure if had a period since delivery. No pain. Had Depo shot about 5wks ago

## 2014-10-13 ENCOUNTER — Encounter (HOSPITAL_COMMUNITY): Payer: Self-pay | Admitting: *Deleted

## 2014-10-13 LAB — HIV ANTIBODY (ROUTINE TESTING W REFLEX): HIV 1&2 Ab, 4th Generation: NONREACTIVE

## 2014-10-14 LAB — GC/CHLAMYDIA PROBE AMP
CT Probe RNA: NEGATIVE
GC Probe RNA: NEGATIVE

## 2014-11-30 ENCOUNTER — Encounter (HOSPITAL_COMMUNITY): Payer: Self-pay | Admitting: *Deleted

## 2014-11-30 ENCOUNTER — Inpatient Hospital Stay (HOSPITAL_COMMUNITY)
Admission: AD | Admit: 2014-11-30 | Discharge: 2014-11-30 | Disposition: A | Discharge status: Home / Self Care | Payer: Medicaid Other | Source: Ambulatory Visit | Attending: Family Medicine | Admitting: Family Medicine

## 2014-11-30 DIAGNOSIS — R197 Diarrhea, unspecified: Secondary | ICD-10-CM | POA: Insufficient documentation

## 2014-11-30 DIAGNOSIS — R109 Unspecified abdominal pain: Secondary | ICD-10-CM | POA: Diagnosis not present

## 2014-11-30 DIAGNOSIS — F17210 Nicotine dependence, cigarettes, uncomplicated: Secondary | ICD-10-CM | POA: Insufficient documentation

## 2014-11-30 DIAGNOSIS — O218 Other vomiting complicating pregnancy: Secondary | ICD-10-CM

## 2014-11-30 DIAGNOSIS — O219 Vomiting of pregnancy, unspecified: Secondary | ICD-10-CM | POA: Insufficient documentation

## 2014-11-30 DIAGNOSIS — O99332 Smoking (tobacco) complicating pregnancy, second trimester: Secondary | ICD-10-CM | POA: Insufficient documentation

## 2014-11-30 DIAGNOSIS — Z3A15 15 weeks gestation of pregnancy: Secondary | ICD-10-CM | POA: Insufficient documentation

## 2014-11-30 DIAGNOSIS — R112 Nausea with vomiting, unspecified: Secondary | ICD-10-CM

## 2014-11-30 MED ORDER — LACTATED RINGERS IV SOLN
25.0000 mg | Freq: Once | INTRAVENOUS | Status: AC
  Administered 2014-11-30: 25 mg via INTRAVENOUS
  Filled 2014-11-30: qty 1
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
## 2014-11-30 NOTE — Discharge Instructions (Signed)
Begin prenatal care ASAP. Drink at least 8 8-oz glasses of water every day. BRAT diet today and advance slowly as tolerated.

## 2014-11-30 NOTE — MAU Note (Signed)
Abdominal pain started about an hour ago as well as vomiting on her way to the hospital.  Has been having diarrhea as well.  Denies VB.

## 2014-12-12 NOTE — L&D Delivery Note (Cosign Needed)
Delivery Note At 7:42 AM a viable female was delivered via Vaginal, Spontaneous Delivery (Presentation: ; Occiput Anterior).  APGAR:8/9 , ; weight  pending.     Anesthesia: Epidural  Episiotomy: None Lacerations:  none Suture Repair: n/a Est. Blood Loss (mL):  200  Mom to postpartum.  Baby to Couplet care / Skin to Skin.  CRESENZO-DISHMAN,Cashus Halterman 04/24/2015, 8:05 AM

## 2014-12-16 ENCOUNTER — Encounter: Payer: Medicaid Other | Admitting: Obstetrics & Gynecology

## 2014-12-24 ENCOUNTER — Ambulatory Visit (HOSPITAL_COMMUNITY)
Admit: 2014-12-24 | Discharge: 2014-12-24 | Disposition: A | Discharge status: Home / Self Care | Payer: Medicaid Other | Attending: Nurse Practitioner | Admitting: Nurse Practitioner

## 2014-12-24 ENCOUNTER — Inpatient Hospital Stay (HOSPITAL_COMMUNITY)
Admission: AD | Admit: 2014-12-24 | Discharge: 2014-12-24 | Disposition: A | Discharge status: Home / Self Care | Payer: Medicaid Other | Source: Ambulatory Visit | Attending: Obstetrics and Gynecology | Admitting: Obstetrics and Gynecology

## 2014-12-24 ENCOUNTER — Other Ambulatory Visit (HOSPITAL_COMMUNITY): Payer: Self-pay | Admitting: Nurse Practitioner

## 2014-12-24 ENCOUNTER — Encounter (HOSPITAL_COMMUNITY): Payer: Self-pay | Admitting: *Deleted

## 2014-12-24 DIAGNOSIS — Z3689 Encounter for other specified antenatal screening: Secondary | ICD-10-CM | POA: Insufficient documentation

## 2014-12-24 DIAGNOSIS — F17210 Nicotine dependence, cigarettes, uncomplicated: Secondary | ICD-10-CM | POA: Insufficient documentation

## 2014-12-24 DIAGNOSIS — Z3A18 18 weeks gestation of pregnancy: Secondary | ICD-10-CM | POA: Insufficient documentation

## 2014-12-24 DIAGNOSIS — O209 Hemorrhage in early pregnancy, unspecified: Secondary | ICD-10-CM | POA: Diagnosis not present

## 2014-12-24 DIAGNOSIS — O99332 Smoking (tobacco) complicating pregnancy, second trimester: Secondary | ICD-10-CM | POA: Insufficient documentation

## 2014-12-24 DIAGNOSIS — N939 Abnormal uterine and vaginal bleeding, unspecified: Secondary | ICD-10-CM

## 2014-12-24 LAB — URINALYSIS, ROUTINE W REFLEX MICROSCOPIC
Bilirubin Urine: NEGATIVE
GLUCOSE, UA: NEGATIVE mg/dL
Hgb urine dipstick: NEGATIVE
KETONES UR: NEGATIVE mg/dL
Leukocytes, UA: NEGATIVE
Nitrite: NEGATIVE
Protein, ur: NEGATIVE mg/dL
SPECIFIC GRAVITY, URINE: 1.02 (ref 1.005–1.030)
Urobilinogen, UA: 1 mg/dL (ref 0.0–1.0)
pH: 6.5 (ref 5.0–8.0)

## 2014-12-24 LAB — RAPID URINE DRUG SCREEN, HOSP PERFORMED
Amphetamines: NOT DETECTED
Barbiturates: NOT DETECTED
Benzodiazepines: NOT DETECTED
COCAINE: POSITIVE — AB
Opiates: NOT DETECTED
TETRAHYDROCANNABINOL: POSITIVE — AB

## 2014-12-24 LAB — WET PREP, GENITAL
Trich, Wet Prep: NONE SEEN
Yeast Wet Prep HPF POC: NONE SEEN

## 2014-12-24 LAB — GC/CHLAMYDIA PROBE AMP (~~LOC~~) NOT AT ARMC
Chlamydia: NEGATIVE
Neisseria Gonorrhea: NEGATIVE

## 2014-12-24 MED ORDER — METOCLOPRAMIDE HCL 10 MG PO TABS: 10.0000 mg | ORAL_TABLET | Freq: Four times a day (QID) | ORAL | Status: DC

## 2014-12-24 MED ORDER — METOCLOPRAMIDE HCL 10 MG PO TABS
10.0000 mg | ORAL_TABLET | Freq: Once | ORAL | Status: AC
  Administered 2014-12-24: 10 mg via ORAL
  Filled 2014-12-24: qty 1

## 2014-12-24 NOTE — MAU Note (Signed)
Vomiting and diarrhea during the night- 3 stools in an hour, none since

## 2014-12-24 NOTE — MAU Note (Signed)
Doing a little leaking and bleeding.  Started yesterday, lasted a little too long, so wanted to make sure everything is straight.  Was trying to get abortion, so has not had any care.
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
## 2014-12-24 NOTE — Discharge Instructions (Signed)
Vaginal Bleeding During Pregnancy, Second Trimester °A small amount of bleeding (spotting) from the vagina is relatively common in pregnancy. It usually stops on its own. Various things can cause bleeding or spotting in pregnancy. Some bleeding may be related to the pregnancy, and some may not. Sometimes the bleeding is normal and is not a problem. However, bleeding can also be a sign of something serious. Be sure to tell your health care provider about any vaginal bleeding right away. °Some possible causes of vaginal bleeding during the second trimester include: °· Infection, inflammation, or growths on the cervix.   °· The placenta may be partially or completely covering the opening of the cervix inside the uterus (placenta previa). °· The placenta may have separated from the uterus (abruption of the placenta).   °· You may be having early (preterm) labor.   °· The cervix may not be strong enough to keep a baby inside the uterus (cervical insufficiency).   °· Tiny cysts may have developed in the uterus instead of pregnancy tissue (molar pregnancy).  °HOME CARE INSTRUCTIONS  °Watch your condition for any changes. The following actions may help to lessen any discomfort you are feeling: °· Follow your health care provider's instructions for limiting your activity. If your health care provider orders bed rest, you may need to stay in bed and only get up to use the bathroom. However, your health care provider may allow you to continue light activity. °· If needed, make plans for someone to help with your regular activities and responsibilities while you are on bed rest. °· Keep track of the number of pads you use each day, how often you change pads, and how soaked (saturated) they are. Write this down. °· Do not use tampons. Do not douche. °· Do not have sexual intercourse or orgasms until approved by your health care provider. °· If you pass any tissue from your vagina, save the tissue so you can show it to your  health care provider. °· Only take over-the-counter or prescription medicines as directed by your health care provider. °· Do not take aspirin because it can make you bleed. °· Do not exercise or perform any strenuous activities or heavy lifting without your health care provider's permission. °· Keep all follow-up appointments as directed by your health care provider. °SEEK MEDICAL CARE IF: °· You have any vaginal bleeding during any part of your pregnancy. °· You have cramps or labor pains. °· You have a fever, not controlled by medicine. °SEEK IMMEDIATE MEDICAL CARE IF:  °· You have severe cramps in your back or belly (abdomen). °· You have contractions. °· You have chills. °· You pass large clots or tissue from your vagina. °· Your bleeding increases. °· You feel light-headed or weak, or you have fainting episodes. °· You are leaking fluid or have a gush of fluid from your vagina. °MAKE SURE YOU: °· Understand these instructions. °· Will watch your condition. °· Will get help right away if you are not doing well or get worse. °Document Released: 09/07/2005 Document Revised: 12/03/2013 Document Reviewed: 08/05/2013 °Exit[employer]® Patient Information ©2015 [employer]. This information is not intended to replace advice given to you by your health care provider. Make sure you discuss any questions you have with your health care provider. ° °
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
## 2014-12-25 ENCOUNTER — Telehealth: Payer: Self-pay

## 2014-12-25 ENCOUNTER — Encounter: Payer: Medicaid Other | Admitting: Family Medicine

## 2014-12-25 DIAGNOSIS — Z3A18 18 weeks gestation of pregnancy: Secondary | ICD-10-CM

## 2014-12-25 NOTE — Telephone Encounter (Signed)
Patient returned call. Informed her of U/S results and appointment information. Patient verbalized understanding and gratitude. No questions or concerns.

## 2014-12-25 NOTE — Telephone Encounter (Signed)
U/S shows IUP 7121w5d with recommendations for repeat U/S in 4 weeks to complete anatomy. U/S scheduled for 01/22/14 at 1345. Patient has NOB scheduled for 12/31/14 at 0820. Attempted to contact patient. Direct to voicemail. Left message stating we are calling with results and information about appointments, please call clinic.

## 2014-12-25 NOTE — Telephone Encounter (Signed)
-----   Message from Marcellina MillinKelly L Moser sent at 12/24/2014  2:27 PM EST ----- Regarding: U/S results We have just completed a 2nd or 3rd trimester outpatient ultrasound scheduled for a patient who was seen in MAU.  Please call the patient with the results.

## 2014-12-31 ENCOUNTER — Encounter: Payer: Self-pay | Admitting: Advanced Practice Midwife

## 2014-12-31 ENCOUNTER — Telehealth: Payer: Self-pay | Admitting: Advanced Practice Midwife

## 2014-12-31 ENCOUNTER — Encounter: Payer: Medicaid Other | Admitting: Advanced Practice Midwife

## 2014-12-31 NOTE — Telephone Encounter (Signed)
No answer was unable to leave message.

## 2015-01-05 ENCOUNTER — Ambulatory Visit (INDEPENDENT_AMBULATORY_CARE_PROVIDER_SITE_OTHER): Payer: Medicaid Other | Admitting: Advanced Practice Midwife

## 2015-01-05 ENCOUNTER — Other Ambulatory Visit (HOSPITAL_COMMUNITY)
Admission: RE | Admit: 2015-01-05 | Discharge: 2015-01-05 | Disposition: A | Discharge status: Home / Self Care | Payer: Medicaid Other | Source: Ambulatory Visit | Attending: Advanced Practice Midwife | Admitting: Advanced Practice Midwife

## 2015-01-05 ENCOUNTER — Encounter: Payer: Self-pay | Admitting: Advanced Practice Midwife

## 2015-01-05 VITALS — BP 102/60 | HR 84 | Temp 97.8°F | Wt 157.6 lb

## 2015-01-05 DIAGNOSIS — Z113 Encounter for screening for infections with a predominantly sexual mode of transmission: Secondary | ICD-10-CM | POA: Insufficient documentation

## 2015-01-05 DIAGNOSIS — Z23 Encounter for immunization: Secondary | ICD-10-CM

## 2015-01-05 DIAGNOSIS — Z3492 Encounter for supervision of normal pregnancy, unspecified, second trimester: Secondary | ICD-10-CM

## 2015-01-05 DIAGNOSIS — Z118 Encounter for screening for other infectious and parasitic diseases: Secondary | ICD-10-CM

## 2015-01-05 DIAGNOSIS — Z01419 Encounter for gynecological examination (general) (routine) without abnormal findings: Secondary | ICD-10-CM | POA: Insufficient documentation

## 2015-01-05 DIAGNOSIS — Z124 Encounter for screening for malignant neoplasm of cervix: Secondary | ICD-10-CM

## 2015-01-05 LAB — POCT URINALYSIS DIP (DEVICE)
Bilirubin Urine: NEGATIVE
Glucose, UA: NEGATIVE mg/dL
HGB URINE DIPSTICK: NEGATIVE
KETONES UR: NEGATIVE mg/dL
LEUKOCYTES UA: NEGATIVE
NITRITE: NEGATIVE
PH: 7 (ref 5.0–8.0)
Protein, ur: NEGATIVE mg/dL
SPECIFIC GRAVITY, URINE: 1.02 (ref 1.005–1.030)
Urobilinogen, UA: 1 mg/dL (ref 0.0–1.0)

## 2015-01-05 NOTE — Progress Notes (Signed)
Pt discharge is light green Initial OB appointment Flu vaccine
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
## 2015-01-06 ENCOUNTER — Encounter: Payer: Self-pay | Admitting: Advanced Practice Midwife

## 2015-01-06 DIAGNOSIS — O0930 Supervision of pregnancy with insufficient antenatal care, unspecified trimester: Secondary | ICD-10-CM | POA: Insufficient documentation

## 2015-01-06 DIAGNOSIS — O09899 Supervision of other high risk pregnancies, unspecified trimester: Secondary | ICD-10-CM | POA: Insufficient documentation

## 2015-01-06 DIAGNOSIS — Z8742 Personal history of other diseases of the female genital tract: Secondary | ICD-10-CM | POA: Insufficient documentation

## 2015-01-06 DIAGNOSIS — O99322 Drug use complicating pregnancy, second trimester: Secondary | ICD-10-CM | POA: Insufficient documentation

## 2015-01-06 DIAGNOSIS — O09219 Supervision of pregnancy with history of pre-term labor, unspecified trimester: Secondary | ICD-10-CM

## 2015-01-06 LAB — PRENATAL PROFILE (SOLSTAS)
Antibody Screen: NEGATIVE
Basophils Absolute: 0 10*3/uL (ref 0.0–0.1)
Basophils Relative: 0 % (ref 0–1)
EOS PCT: 1 % (ref 0–5)
Eosinophils Absolute: 0.1 10*3/uL (ref 0.0–0.7)
HEMATOCRIT: 30.1 % — AB (ref 36.0–46.0)
HEP B S AG: NEGATIVE
HIV: NONREACTIVE
Hemoglobin: 10.2 g/dL — ABNORMAL LOW (ref 12.0–15.0)
LYMPHS PCT: 27 % (ref 12–46)
Lymphs Abs: 1.8 10*3/uL (ref 0.7–4.0)
MCH: 29.8 pg (ref 26.0–34.0)
MCHC: 33.9 g/dL (ref 30.0–36.0)
MCV: 88 fL (ref 78.0–100.0)
MONOS PCT: 6 % (ref 3–12)
MPV: 8.7 fL (ref 8.6–12.4)
Monocytes Absolute: 0.4 10*3/uL (ref 0.1–1.0)
NEUTROS ABS: 4.4 10*3/uL (ref 1.7–7.7)
Neutrophils Relative %: 66 % (ref 43–77)
PLATELETS: 339 10*3/uL (ref 150–400)
RBC: 3.42 MIL/uL — ABNORMAL LOW (ref 3.87–5.11)
RDW: 14.2 % (ref 11.5–15.5)
RH TYPE: POSITIVE
RUBELLA: 10.3 {index} — AB (ref <0.90)
WBC: 6.6 10*3/uL (ref 4.0–10.5)

## 2015-01-06 LAB — CYTOLOGY - PAP

## 2015-01-07 ENCOUNTER — Telehealth: Payer: Self-pay | Admitting: General Practice

## 2015-01-07 DIAGNOSIS — O09219 Supervision of pregnancy with history of pre-term labor, unspecified trimester: Secondary | ICD-10-CM

## 2015-01-07 DIAGNOSIS — O0930 Supervision of pregnancy with insufficient antenatal care, unspecified trimester: Secondary | ICD-10-CM

## 2015-01-07 LAB — HEMOGLOBINOPATHY EVALUATION
HEMOGLOBIN OTHER: 0 %
HGB S QUANTITAION: 0 %
Hgb A2 Quant: 2.5 % (ref 2.2–3.2)
Hgb A: 97.5 % (ref 96.8–97.8)
Hgb F Quant: 0 % (ref 0.0–2.0)

## 2015-01-07 NOTE — Telephone Encounter (Signed)
Patient needs to come in and fill out 17p paperwork and also needs urine culture/drug screen. Called patient, no answer- left message that we are trying to reach her as we need her to come back in to the office, please call us back.

## 2015-01-07 NOTE — Telephone Encounter (Signed)
-----   Message from Juliette MangleKelly Powell Rassette, RN sent at 01/07/2015  7:43 AM EST ----- Regarding: FW: New OB pt needs 17-P Contact: [phone] Please order 17P for this patient.  We will need to call her to come in once it is received. Thanks, kelly ----- Message -----    From: Hurshel PartyLisa A Leftwich-Kirby, CNM    Sent: 01/06/2015   8:43 PM      To: Emilio AspenKelly Powell Rassette, RN Subject: New OB pt needs 17-P                           Tresa EndoKelly, I saw this pt yesterday in clinic and discussed 17-P with her r/t hx of preterm birth at 35 weeks with previous pregnancy.  She is interested in the medication.  I am just now finishing her chart and forgot to let anyone in clinic know.  Can we order this for her and schedule her to get injection as soon as it arrives?  She also needs urine culture and drug screening as this was not done at her first visit (pt left quickly r/t her child). Thanks so much.

## 2015-01-08 LAB — ALPHA FETOPROTEIN, MATERNAL
AFP: 46.3 ng/mL
Curr Gest Age: 20.3 wks.days
MoM for AFP: 0.7
Open Spina bifida: NEGATIVE
Osb Risk: <1:54600 {titer}

## 2015-01-20 NOTE — Telephone Encounter (Signed)
Note made on ultrasound visit for patient to come to clinic on Thursday.

## 2015-01-22 ENCOUNTER — Other Ambulatory Visit: Payer: Self-pay | Admitting: Family Medicine

## 2015-01-22 ENCOUNTER — Ambulatory Visit (HOSPITAL_COMMUNITY)
Admission: RE | Admit: 2015-01-22 | Discharge: 2015-01-22 | Disposition: A | Discharge status: Home / Self Care | Payer: Medicaid Other | Source: Ambulatory Visit | Attending: Family Medicine | Admitting: Family Medicine

## 2015-01-22 ENCOUNTER — Other Ambulatory Visit: Payer: Self-pay | Admitting: Obstetrics & Gynecology

## 2015-01-22 DIAGNOSIS — O99322 Drug use complicating pregnancy, second trimester: Secondary | ICD-10-CM | POA: Insufficient documentation

## 2015-01-22 DIAGNOSIS — F17210 Nicotine dependence, cigarettes, uncomplicated: Secondary | ICD-10-CM | POA: Insufficient documentation

## 2015-01-22 DIAGNOSIS — O26872 Cervical shortening, second trimester: Secondary | ICD-10-CM | POA: Insufficient documentation

## 2015-01-22 DIAGNOSIS — Z3A22 22 weeks gestation of pregnancy: Secondary | ICD-10-CM | POA: Insufficient documentation

## 2015-01-22 DIAGNOSIS — O09212 Supervision of pregnancy with history of pre-term labor, second trimester: Secondary | ICD-10-CM | POA: Insufficient documentation

## 2015-01-22 DIAGNOSIS — O3440 Maternal care for other abnormalities of cervix, unspecified trimester: Secondary | ICD-10-CM | POA: Insufficient documentation

## 2015-01-22 DIAGNOSIS — O99332 Smoking (tobacco) complicating pregnancy, second trimester: Secondary | ICD-10-CM | POA: Diagnosis present

## 2015-01-22 DIAGNOSIS — Z36 Encounter for antenatal screening of mother: Secondary | ICD-10-CM | POA: Diagnosis not present

## 2015-01-22 DIAGNOSIS — Z3A18 18 weeks gestation of pregnancy: Secondary | ICD-10-CM

## 2015-01-22 DIAGNOSIS — F1290 Cannabis use, unspecified, uncomplicated: Secondary | ICD-10-CM | POA: Insufficient documentation

## 2015-01-22 DIAGNOSIS — Z0489 Encounter for examination and observation for other specified reasons: Secondary | ICD-10-CM | POA: Insufficient documentation

## 2015-01-22 DIAGNOSIS — IMO0002 Reserved for concepts with insufficient information to code with codable children: Secondary | ICD-10-CM | POA: Insufficient documentation

## 2015-01-22 MED ORDER — PROGESTERONE MICRONIZED 200 MG PO CAPS: ORAL_CAPSULE | ORAL | Status: DC

## 2015-01-22 NOTE — Telephone Encounter (Signed)
Called patient and informed her of need to come fill out 17p paperwork and to drop off a urine sample. Patient states she asked if she needed to come down when she was here for her ultrasound but they told her no. Patient states that is fine, she can come in tomorrow. Patient had no questions

## 2015-01-22 NOTE — Progress Notes (Signed)
Faculty Practice OB/GYN Attending Note  29 y.o. 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  Patient has a history of preterm delivery and is on 17-P.  After review of study by Dr. Claudean SeveranceWhitecar (MFM), he recommended Prometrium vaginal capsules to help with the dynamic cervix. This was prescribed for patient.  She will follow up in clinic as scheduled. Preterm labor precautions reviewed.    Jaynie CollinsUGONNA  Damiah Mcdonald, MD, FACOG Attending Obstetrician & Gynecologist Faculty Practice, [facility]

## 2015-01-27 ENCOUNTER — Telehealth: Payer: Self-pay | Admitting: General Practice

## 2015-01-27 DIAGNOSIS — O26872 Cervical shortening, second trimester: Secondary | ICD-10-CM

## 2015-01-27 NOTE — Telephone Encounter (Signed)
Patient called back to front office and I informed her of ultrasound appt and OB appt immediately following. Patient verbalized understanding and had no questions

## 2015-01-27 NOTE — Telephone Encounter (Signed)
Called patient, no answer and voicemail full- could not leave voicemail but sent SMS notification of phone number. Called patient's grandmother informing her to let Sheryle HailDiamond know we are trying to reach her with a sooner appt if she will give us a call back. Patient's grandmother states she will let her know. Ultrasound scheduled for 2/18 @ 1:15 prior to OB appt in clinic

## 2015-01-27 NOTE — Telephone Encounter (Signed)
-----   Message from Levie HeritageJacob J Stinson, DO sent at 01/23/2015  4:34 PM EST ----- Patient needs a repeat US in 1 week for cervical length.  Thanks

## 2015-01-29 ENCOUNTER — Encounter: Payer: Self-pay | Admitting: Physician Assistant

## 2015-01-29 ENCOUNTER — Other Ambulatory Visit: Payer: Self-pay | Admitting: General Practice

## 2015-01-29 ENCOUNTER — Ambulatory Visit (INDEPENDENT_AMBULATORY_CARE_PROVIDER_SITE_OTHER): Payer: Medicaid Other | Admitting: Physician Assistant

## 2015-01-29 ENCOUNTER — Ambulatory Visit (HOSPITAL_COMMUNITY)
Admission: RE | Admit: 2015-01-29 | Discharge: 2015-01-29 | Disposition: A | Discharge status: Home / Self Care | Payer: Medicaid Other | Source: Ambulatory Visit | Attending: Family Medicine | Admitting: Family Medicine

## 2015-01-29 VITALS — BP 111/65 | HR 87 | Temp 98.7°F | Wt 164.1 lb

## 2015-01-29 DIAGNOSIS — O26872 Cervical shortening, second trimester: Secondary | ICD-10-CM | POA: Insufficient documentation

## 2015-01-29 DIAGNOSIS — O3440 Maternal care for other abnormalities of cervix, unspecified trimester: Secondary | ICD-10-CM

## 2015-01-29 DIAGNOSIS — Z3492 Encounter for supervision of normal pregnancy, unspecified, second trimester: Secondary | ICD-10-CM

## 2015-01-29 DIAGNOSIS — Z3A23 23 weeks gestation of pregnancy: Secondary | ICD-10-CM | POA: Insufficient documentation

## 2015-01-29 LAB — POCT URINALYSIS DIP (DEVICE)
BILIRUBIN URINE: NEGATIVE
GLUCOSE, UA: NEGATIVE mg/dL
Hgb urine dipstick: NEGATIVE
Ketones, ur: NEGATIVE mg/dL
Nitrite: NEGATIVE
Protein, ur: NEGATIVE mg/dL
Specific Gravity, Urine: 1.015 (ref 1.005–1.030)
Urobilinogen, UA: 1 mg/dL (ref 0.0–1.0)
pH: 7.5 (ref 5.0–8.0)

## 2015-01-29 MED ORDER — PROGESTERONE MICRONIZED 200 MG PO CAPS: ORAL_CAPSULE | ORAL | Status: DC

## 2015-01-29 NOTE — Patient Instructions (Signed)
Second Trimester of Pregnancy The second trimester is from week 13 through week 28, months 4 through 6. The second trimester is often a time when you feel your best. Your body has also adjusted to being pregnant, and you begin to feel better physically. Usually, morning sickness has lessened or quit completely, you may have more energy, and you may have an increase in appetite. The second trimester is also a time when the fetus is growing rapidly. At the end of the sixth month, the fetus is about 9 inches long and weighs about 1 pounds. You will likely begin to feel the baby move (quickening) between 18 and 20 weeks of the pregnancy. BODY CHANGES Your body goes through many changes during pregnancy. The changes vary from woman to woman.   Your weight will continue to increase. You will notice your lower abdomen bulging out.  You may begin to get stretch marks on your hips, abdomen, and breasts.  You may develop headaches that can be relieved by medicines approved by your health care provider.  You may urinate more often because the fetus is pressing on your bladder.  You may develop or continue to have heartburn as a result of your pregnancy.  You may develop constipation because certain hormones are causing the muscles that push waste through your intestines to slow down.  You may develop hemorrhoids or swollen, bulging veins (varicose veins).  You may have back pain because of the weight gain and pregnancy hormones relaxing your joints between the bones in your pelvis and as a result of a shift in weight and the muscles that support your balance.  Your breasts will continue to grow and be tender.  Your gums may bleed and may be sensitive to brushing and flossing.  Dark spots or blotches (chloasma, mask of pregnancy) may develop on your face. This will likely fade after the baby is born.  A dark line from your belly button to the pubic area (linea nigra) may appear. This will likely fade  after the baby is born.  You may have changes in your hair. These can include thickening of your hair, rapid growth, and changes in texture. Some women also have hair loss during or after pregnancy, or hair that feels dry or thin. Your hair will most likely return to normal after your baby is born. WHAT TO EXPECT AT YOUR PRENATAL VISITS During a routine prenatal visit:  You will be weighed to make sure you and the fetus are growing normally.  Your blood pressure will be taken.  Your abdomen will be measured to track your baby's growth.  The fetal heartbeat will be listened to.  Any test results from the previous visit will be discussed. Your health care provider may ask you:  How you are feeling.  If you are feeling the baby move.  If you have had any abnormal symptoms, such as leaking fluid, bleeding, severe headaches, or abdominal cramping.  If you have any questions. Other tests that may be performed during your second trimester include:  Blood tests that check for:  Low iron levels (anemia).  Gestational diabetes (between 24 and 28 weeks).  Rh antibodies.  Urine tests to check for infections, diabetes, or protein in the urine.  An ultrasound to confirm the proper growth and development of the baby.  An amniocentesis to check for possible genetic problems.  Fetal screens for spina bifida and Down syndrome. HOME CARE INSTRUCTIONS   Avoid all smoking, herbs, alcohol, and unprescribed   drugs. These chemicals affect the formation and growth of the baby.  Follow your health care provider's instructions regarding medicine use. There are medicines that are either safe or unsafe to take during pregnancy.  Exercise only as directed by your health care provider. Experiencing uterine cramps is a good sign to stop exercising.  Continue to eat regular, healthy meals.  Wear a good support bra for breast tenderness.  Do not use hot tubs, steam rooms, or saunas.  Wear your  seat belt at all times when driving.  Avoid raw meat, uncooked cheese, cat litter boxes, and soil used by cats. These carry germs that can cause birth defects in the baby.  Take your prenatal vitamins.  Try taking a stool softener (if your health care provider approves) if you develop constipation. Eat more high-fiber foods, such as fresh vegetables or fruit and whole grains. Drink plenty of fluids to keep your urine clear or pale yellow.  Take warm sitz baths to soothe any pain or discomfort caused by hemorrhoids. Use hemorrhoid cream if your health care provider approves.  If you develop varicose veins, wear support hose. Elevate your feet for 15 minutes, 3-4 times a day. Limit salt in your diet.  Avoid heavy lifting, wear low heel shoes, and practice good posture.  Rest with your legs elevated if you have leg cramps or low back pain.  Visit your dentist if you have not gone yet during your pregnancy. Use a soft toothbrush to brush your teeth and be gentle when you floss.  A sexual relationship may be continued unless your health care provider directs you otherwise.  Continue to go to all your prenatal visits as directed by your health care provider. SEEK MEDICAL CARE IF:   You have dizziness.  You have mild pelvic cramps, pelvic pressure, or nagging pain in the abdominal area.  You have persistent nausea, vomiting, or diarrhea.  You have a bad smelling vaginal discharge.  You have pain with urination. SEEK IMMEDIATE MEDICAL CARE IF:   You have a fever.  You are leaking fluid from your vagina.  You have spotting or bleeding from your vagina.  You have severe abdominal cramping or pain.  You have rapid weight gain or loss.  You have shortness of breath with chest pain.  You notice sudden or extreme swelling of your face, hands, ankles, feet, or legs.  You have not felt your baby move in over an hour.  You have severe headaches that do not go away with  medicine.  You have vision changes. Document Released: 11/22/2001 Document Revised: 12/03/2013 Document Reviewed: 01/29/2013 ExitCare Patient Information 2015 [employer]. This information is not intended to replace advice given to you by your health care provider. Make sure you discuss any questions you have with your health care provider.  

## 2015-01-29 NOTE — Progress Notes (Signed)
23 weeks.  U/S was today to eval short cervix.  Report unavailable presently.  Pt has not yet filled rx for vaginal progesterone.  17P again discussed.  She does not have reliable transportation and states she does not want to commit to 17P injections.  She advises no cocaine or marijuana since last seen however the FOB does smoke MJ around her.   Advised to stay away from other smoking MJ and have them shower and change clothes prior to holding baby.  PNV qd Get prometrium rx and use Await U/S results NO illegal drugs RTC 2 weeks

## 2015-01-31 LAB — DRUG SCREEN, URINE
Amphetamine Screen, Ur: NEGATIVE
Barbiturate Quant, Ur: NEGATIVE
Benzodiazepines.: NEGATIVE
COCAINE METABOLITES: NEGATIVE
CREATININE, U: 159.71 mg/dL
MARIJUANA METABOLITE: POSITIVE — AB
Methadone: NEGATIVE
Opiates: NEGATIVE
PHENCYCLIDINE (PCP): NEGATIVE
Propoxyphene: NEGATIVE

## 2015-02-02 LAB — CULTURE, OB URINE: Colony Count: 70000

## 2015-02-04 ENCOUNTER — Encounter: Payer: Self-pay | Admitting: *Deleted

## 2015-02-10 ENCOUNTER — Encounter: Payer: Self-pay | Admitting: General Practice

## 2015-02-11 ENCOUNTER — Encounter: Payer: Medicaid Other | Admitting: Physician Assistant

## 2015-02-20 ENCOUNTER — Encounter: Payer: Self-pay | Admitting: Obstetrics and Gynecology

## 2015-02-20 ENCOUNTER — Encounter: Payer: Medicaid Other | Admitting: Obstetrics and Gynecology

## 2015-03-04 ENCOUNTER — Ambulatory Visit (INDEPENDENT_AMBULATORY_CARE_PROVIDER_SITE_OTHER): Payer: Medicaid Other | Admitting: Advanced Practice Midwife

## 2015-03-04 VITALS — BP 112/66 | HR 80 | Wt 173.2 lb

## 2015-03-04 DIAGNOSIS — Z23 Encounter for immunization: Secondary | ICD-10-CM

## 2015-03-04 DIAGNOSIS — O26872 Cervical shortening, second trimester: Secondary | ICD-10-CM

## 2015-03-04 DIAGNOSIS — O09213 Supervision of pregnancy with history of pre-term labor, third trimester: Secondary | ICD-10-CM

## 2015-03-04 DIAGNOSIS — O09893 Supervision of other high risk pregnancies, third trimester: Secondary | ICD-10-CM

## 2015-03-04 DIAGNOSIS — O26873 Cervical shortening, third trimester: Secondary | ICD-10-CM

## 2015-03-04 LAB — POCT URINALYSIS DIP (DEVICE)
BILIRUBIN URINE: NEGATIVE
GLUCOSE, UA: NEGATIVE mg/dL
Hgb urine dipstick: NEGATIVE
KETONES UR: NEGATIVE mg/dL
Nitrite: NEGATIVE
PH: 6.5 (ref 5.0–8.0)
PROTEIN: 30 mg/dL — AB
SPECIFIC GRAVITY, URINE: 1.025 (ref 1.005–1.030)
Urobilinogen, UA: 0.2 mg/dL (ref 0.0–1.0)

## 2015-03-04 LAB — CBC
HEMATOCRIT: 27.3 % — AB (ref 36.0–46.0)
Hemoglobin: 9.4 g/dL — ABNORMAL LOW (ref 12.0–15.0)
MCH: 30.7 pg (ref 26.0–34.0)
MCHC: 34.4 g/dL (ref 30.0–36.0)
MCV: 89.2 fL (ref 78.0–100.0)
MPV: 9.2 fL (ref 8.6–12.4)
Platelets: 255 10*3/uL (ref 150–400)
RBC: 3.06 MIL/uL — ABNORMAL LOW (ref 3.87–5.11)
RDW: 13.8 % (ref 11.5–15.5)
WBC: 5.9 10*3/uL (ref 4.0–10.5)

## 2015-03-04 MED ORDER — TETANUS-DIPHTH-ACELL PERTUSSIS 5-2.5-18.5 LF-MCG/0.5 IM SUSP
0.5000 mL | Freq: Once | INTRAMUSCULAR | Status: AC
  Administered 2015-03-04: 0.5 mL via INTRAMUSCULAR

## 2015-03-04 NOTE — Progress Notes (Signed)
Using Progesterone. CL ordered. No UC's.

## 2015-03-04 NOTE — Progress Notes (Signed)
28 week labs today.  

## 2015-03-04 NOTE — Patient Instructions (Signed)

## 2015-03-05 LAB — GLUCOSE TOLERANCE, 1 HOUR (50G) W/O FASTING: GLUCOSE 1 HOUR GTT: 146 mg/dL — AB (ref 70–140)

## 2015-03-05 LAB — HIV ANTIBODY (ROUTINE TESTING W REFLEX): HIV 1&2 Ab, 4th Generation: NONREACTIVE

## 2015-03-05 LAB — RPR

## 2015-03-10 ENCOUNTER — Ambulatory Visit (HOSPITAL_COMMUNITY)
Admission: RE | Admit: 2015-03-10 | Discharge: 2015-03-10 | Disposition: A | Discharge status: Home / Self Care | Payer: Medicaid Other | Source: Ambulatory Visit | Attending: Advanced Practice Midwife | Admitting: Advanced Practice Midwife

## 2015-03-10 ENCOUNTER — Encounter: Payer: Self-pay | Admitting: Advanced Practice Midwife

## 2015-03-10 ENCOUNTER — Other Ambulatory Visit: Payer: Self-pay | Admitting: Advanced Practice Midwife

## 2015-03-10 DIAGNOSIS — O26873 Cervical shortening, third trimester: Secondary | ICD-10-CM

## 2015-03-10 DIAGNOSIS — Z3A29 29 weeks gestation of pregnancy: Secondary | ICD-10-CM | POA: Insufficient documentation

## 2015-03-10 DIAGNOSIS — O09213 Supervision of pregnancy with history of pre-term labor, third trimester: Principal | ICD-10-CM

## 2015-03-10 DIAGNOSIS — O99333 Smoking (tobacco) complicating pregnancy, third trimester: Secondary | ICD-10-CM | POA: Insufficient documentation

## 2015-03-10 DIAGNOSIS — O99013 Anemia complicating pregnancy, third trimester: Secondary | ICD-10-CM | POA: Insufficient documentation

## 2015-03-10 DIAGNOSIS — O09893 Supervision of other high risk pregnancies, third trimester: Secondary | ICD-10-CM

## 2015-03-10 MED ORDER — BETAMETHASONE SOD PHOS & ACET 6 (3-3) MG/ML IJ SUSP
12.0000 mg | Freq: Once | INTRAMUSCULAR | Status: AC
  Administered 2015-03-10: 12 mg via INTRAMUSCULAR
  Filled 2015-03-10: qty 2

## 2015-03-11 ENCOUNTER — Ambulatory Visit (HOSPITAL_COMMUNITY)
Admission: RE | Admit: 2015-03-11 | Discharge: 2015-03-11 | Disposition: A | Discharge status: Home / Self Care | Payer: Medicaid Other | Source: Ambulatory Visit | Attending: Advanced Practice Midwife | Admitting: Advanced Practice Midwife

## 2015-03-11 ENCOUNTER — Telehealth: Payer: Self-pay | Admitting: *Deleted

## 2015-03-11 DIAGNOSIS — O26873 Cervical shortening, third trimester: Secondary | ICD-10-CM | POA: Diagnosis not present

## 2015-03-11 DIAGNOSIS — O09213 Supervision of pregnancy with history of pre-term labor, third trimester: Secondary | ICD-10-CM | POA: Diagnosis not present

## 2015-03-11 DIAGNOSIS — Z3A29 29 weeks gestation of pregnancy: Secondary | ICD-10-CM | POA: Insufficient documentation

## 2015-03-11 MED ORDER — BETAMETHASONE SOD PHOS & ACET 6 (3-3) MG/ML IJ SUSP
12.0000 mg | Freq: Once | INTRAMUSCULAR | Status: AC
  Administered 2015-03-11: 12 mg via INTRAMUSCULAR
  Filled 2015-03-11: qty 2

## 2015-03-11 NOTE — Telephone Encounter (Signed)
Patient came by front office requesting results. Informed patient of 1 hr gtt results and need for 3 hr gtt. Told patient she can come at 8 at her next ob appt on 4/6 for 3 hr gtt. Patient verbalized understanding and aware to come in fasting. Patient had no questions

## 2015-03-11 NOTE — Telephone Encounter (Signed)
-----   Message from [place], [place] sent at 03/10/2015 11:34 PM EDT ----- Needs 3 hour GTT.

## 2015-03-11 NOTE — Telephone Encounter (Signed)
Called Christy West and left a message we are trying to reach her with some results and to schedule an appointment. Per chart had elevated 1 hr. GTT , received betamethasone 03/10/15 and getting second dose today in [facility]. Needs 3 hr GTT scheduled 1 week or later after last betamethasone. Has ob appointment 03/18/15 ,can add 3hr gtt to that if agreeable to patient.

## 2015-03-11 NOTE — Telephone Encounter (Signed)
Pt returned call to the clinic.  Contacted patient, informed of results and pt is in agreement with 3 hour gtt on 03/18/15.  Instructions given, pt verbalizes understanding and has no further questions.

## 2015-03-17 ENCOUNTER — Ambulatory Visit (INDEPENDENT_AMBULATORY_CARE_PROVIDER_SITE_OTHER): Payer: Medicaid Other | Admitting: Obstetrics and Gynecology

## 2015-03-17 ENCOUNTER — Encounter: Payer: Self-pay | Admitting: General Practice

## 2015-03-17 VITALS — BP 102/64 | HR 103 | Temp 97.8°F | Wt 175.4 lb

## 2015-03-17 DIAGNOSIS — O99322 Drug use complicating pregnancy, second trimester: Secondary | ICD-10-CM

## 2015-03-17 DIAGNOSIS — D509 Iron deficiency anemia, unspecified: Secondary | ICD-10-CM

## 2015-03-17 DIAGNOSIS — O26873 Cervical shortening, third trimester: Secondary | ICD-10-CM

## 2015-03-17 LAB — POCT URINALYSIS DIP (DEVICE)
BILIRUBIN URINE: NEGATIVE
GLUCOSE, UA: NEGATIVE mg/dL
Hgb urine dipstick: NEGATIVE
KETONES UR: NEGATIVE mg/dL
NITRITE: NEGATIVE
PH: 7.5 (ref 5.0–8.0)
Protein, ur: NEGATIVE mg/dL
SPECIFIC GRAVITY, URINE: 1.015 (ref 1.005–1.030)
Urobilinogen, UA: 0.2 mg/dL (ref 0.0–1.0)

## 2015-03-17 NOTE — Patient Instructions (Signed)
Preterm Birth °Preterm birth is a birth that happens before 37 weeks of pregnancy. Most pregnancies last about 39-41 weeks. Every week in the womb is important and is beneficial to the health of the infant. Infants born before 37 weeks of pregnancy are at a higher risk for complications. Depending on when the infant was born, he or she may be: °· Late preterm. Born between 32 weeks and 37 weeks of pregnancy. °· Very preterm. Born at less than 32 weeks of pregnancy. °· Extremely preterm. Born at less than 25 weeks of pregnancy. °The earlier a baby is born, the more likely the child will have issues related to prematurity. Complications and problems that can be seen in infants born too early include: °· Problems breathing (respiratory distress syndrome). °· Low birth weight. °· Problems feeding. °· Sleeping problems. °· Yellowing of the skin (jaundice). °· Infections such as pneumonia.  °Babies born very preterm or extremely preterm are at risk for more serious medical issues. These include: °· More severe breathing issues. °· Eyesight issues. °· Brain development issues (intraventricular hemorrhage). °· Behavioral and emotional development issues. °· Growth and developmental delays. °· Cerebral palsy. °· Serious feeding or bowel complications (necrotizing enterocolitis). °CAUSES  °There are two broad categories of preterm birth. °· Spontaneous preterm birth. This is a birth resulting from preterm labor (not medically induced) or preterm premature rupture of membranes (PPROM). °· Indicated preterm birth. This is a birth resulting from labor being medically induced due to health, personal, or social reasons. °RISK FACTORS °Preterm birth may be related to certain medical conditions, lifestyle factors, or demographic factors encountered by the mother or fetus. °· Medical conditions include: °¨ Multiple gestations (twins, triplets, and so on). °¨ Infection. °¨ Diabetes. °¨ Heart disease. °¨ Kidney disease. °¨ Cervical or  uterine abnormalities. °¨ Being underweight. °¨ High blood pressure or preeclampsia. °¨ Premature rupture of membranes (PROM). °¨ Birth defects in the fetus. °· Lifestyle factors include: °¨ Poor prenatal care. °¨ Poor nutrition or anemia. °¨ Cigarette smoking. °¨ Consuming alcohol. °¨ High levels of stress and lack of social or emotional support. °¨ Exposure to chemical or environmental toxins. °¨ Substance abuse. °· Demographic factors include: °¨ African-American ethnicity. °¨ Age (younger than 18 or older than 28 years of age). °¨ Low socioeconomic status. °Women with a history of preterm labor or who become pregnant within 18 months of giving birth are also at increased risk for preterm birth. °DIAGNOSIS  °Your health care provider may request additional tests to diagnose underlying complications resulting from preterm birth. Tests on the infant may include: °· Physical exam. °· Blood tests. °· Chest X-rays. °· Heart-lung monitoring. °TREATMENT  °After birth, special care will be taken to assess any problems or complications for the infant. Supportive care will be provided for the infant. Treatment depends on what problems are present and any complications that develop. Some preterm infants are cared for in a neonatal intensive care unit. In general, care may include: °· Maintaining temperature and oxygen in a clear heated box (baby isolette). °· Monitoring the infant's heart rate, breathing, and level of oxygen in the blood. °· Monitoring for signs of infection and, if needed, giving IV antibiotic medicine. °· Inserting a feeding tube (nose, mouth) or giving IV nutrition if unable to feed. °· Inserting a breathing tube (ventilation). °· Respiration support (continuous positive airway pressure [CPAP] or oxygen).  °Treatment will change as the infant builds up strength and is able to breathe and eat on his or her   own. For some infants, no special treatment is necessary. Parents may be educated on the potential  health risks of prematurity to the infant. °HOME CARE INSTRUCTIONS °· Understand your infant's special conditions and needs. It may be reassuring to learn about infant CPR. °· Monitor your infant in the car seat until he or she grows and matures. Infant car seats can cause breathing difficulties for preterm infants. °· Keep your infant warm. Dress your infant in layers and keep him or her away from drafts, especially in cold months of the year. °· Wash your hands thoroughly after going to the bathroom or changing a diaper. Late preterm infants may be more prone to infection. °· Follow all your health care provider's instructions for providing support and care to your preterm infant. °· Get support from organizations and groups that understand your challenges. °· Follow up with your infant's health care provider as directed. °Prevention °There are some things you can do to help lower your risk of having a preterm infant in the future. These include: °· Good prenatal care throughout the entire pregnancy. See a health care provider regularly for advice and tests. °· Management of underlying medical conditions. °· Proper self-care and lifestyle changes. °· Proper diet and weight control. °· Watching for signs of various infections. °SEEK MEDICAL CARE IF: °· Your infant has feeding difficulties. °· Your infant has sleeping difficulties. °· Your infant has breathing difficulties. °· Your infant's skin starts to look yellow. °· Your infant shows signs of infection, such as a stuffy nose, fever, crying, or bluish color of the skin. °FOR MORE INFORMATION °March of Dimes: www.marchofdimes.com °Prematurity.org: www.prematurity.org °Document Released: 02/18/2004 Document Revised: 09/18/2013 Document Reviewed: 06/27/2013 °ExitCare® Patient Information ©2015 [employer]. This information is not intended to replace advice given to you by your health care provider. Make sure you discuss any questions you have with your health  care provider. ° °

## 2015-03-17 NOTE — Progress Notes (Signed)
Trace leuks in urine.  C/o intermittent pelvic pressure and occasional contractions.  Patient needs 3hr-- reports she had appointment scheduled for tomorrow but had to come in today for a family emergency-- reports she will be going out of town and may not be back for a few weeks. Has not been fasting-- had a now and later candy this morning and therefore cannot do it today. Discussed the importance of having 3hr gtt done and advised patient come for one ASAP. Patient verbalizes understanding.

## 2015-03-17 NOTE — Progress Notes (Signed)
C/o contractions intermittently. Has stopped smoking and illicit drugs. UDS sent Cx post soft.  [place] but will return in 2 wk for PNV. PTL precautions.  Advised of need for 3hr OGTT asap, before leaving town. (not fasting today and has eaten candy).

## 2015-03-18 ENCOUNTER — Encounter: Payer: Medicaid Other | Admitting: Advanced Practice Midwife

## 2015-03-22 LAB — CANNABANOIDS (GC/LC/MS), URINE: THC-COOH (GC/LC/MS), ur confirm: 31 ng/mL — AB (ref <5)

## 2015-03-24 ENCOUNTER — Encounter: Payer: Medicaid Other | Admitting: Obstetrics and Gynecology

## 2015-03-24 ENCOUNTER — Encounter: Payer: Self-pay | Admitting: Advanced Practice Midwife

## 2015-03-24 LAB — PRESCRIPTION MONITORING PROFILE (19 PANEL)
Amphetamine/Meth: NEGATIVE ng/mL
Barbiturate Screen, Urine: NEGATIVE ng/mL
Benzodiazepine Screen, Urine: NEGATIVE ng/mL
Buprenorphine, Urine: NEGATIVE ng/mL
COCAINE METABOLITES: NEGATIVE ng/mL
CREATININE, URINE: 45.55 mg/dL (ref >20.0)
Carisoprodol, Urine: NEGATIVE ng/mL
ECSTASY: NEGATIVE ng/mL
Fentanyl, Ur: NEGATIVE ng/mL
METHADONE SCREEN, URINE: NEGATIVE ng/mL
Meperidine, Ur: NEGATIVE ng/mL
Methaqualone: NEGATIVE ng/mL
Nitrites, Initial: NEGATIVE ug/mL
OXYCODONE SCRN UR: NEGATIVE ng/mL
Opiate Screen, Urine: NEGATIVE ng/mL
Phencyclidine, Ur: NEGATIVE ng/mL
Propoxyphene: NEGATIVE ng/mL
Tapentadol, urine: NEGATIVE ng/mL
Tramadol Scrn, Ur: NEGATIVE ng/mL
Zolpidem, Urine: NEGATIVE ng/mL
pH, Initial: 7.6 pH (ref 4.5–8.9)

## 2015-04-03 ENCOUNTER — Encounter (HOSPITAL_COMMUNITY): Payer: Self-pay | Admitting: *Deleted

## 2015-04-03 ENCOUNTER — Inpatient Hospital Stay (HOSPITAL_COMMUNITY)
Admission: AD | Admit: 2015-04-03 | Discharge: 2015-04-04 | DRG: 778 | Disposition: A | Discharge status: Home / Self Care | Payer: Medicaid Other | Source: Ambulatory Visit | Attending: Family Medicine | Admitting: Family Medicine

## 2015-04-03 DIAGNOSIS — O09213 Supervision of pregnancy with history of pre-term labor, third trimester: Secondary | ICD-10-CM | POA: Diagnosis not present

## 2015-04-03 DIAGNOSIS — Z8249 Family history of ischemic heart disease and other diseases of the circulatory system: Secondary | ICD-10-CM | POA: Diagnosis not present

## 2015-04-03 DIAGNOSIS — Z832 Family history of diseases of the blood and blood-forming organs and certain disorders involving the immune mechanism: Secondary | ICD-10-CM | POA: Diagnosis not present

## 2015-04-03 DIAGNOSIS — O26873 Cervical shortening, third trimester: Secondary | ICD-10-CM | POA: Diagnosis present

## 2015-04-03 DIAGNOSIS — R109 Unspecified abdominal pain: Secondary | ICD-10-CM | POA: Diagnosis present

## 2015-04-03 DIAGNOSIS — F17210 Nicotine dependence, cigarettes, uncomplicated: Secondary | ICD-10-CM | POA: Diagnosis present

## 2015-04-03 DIAGNOSIS — O99333 Smoking (tobacco) complicating pregnancy, third trimester: Secondary | ICD-10-CM | POA: Diagnosis present

## 2015-04-03 DIAGNOSIS — Z833 Family history of diabetes mellitus: Secondary | ICD-10-CM

## 2015-04-03 DIAGNOSIS — Z3A33 33 weeks gestation of pregnancy: Secondary | ICD-10-CM | POA: Diagnosis present

## 2015-04-03 LAB — URINALYSIS, ROUTINE W REFLEX MICROSCOPIC
BILIRUBIN URINE: NEGATIVE
GLUCOSE, UA: NEGATIVE mg/dL
KETONES UR: NEGATIVE mg/dL
NITRITE: NEGATIVE
Protein, ur: NEGATIVE mg/dL
Specific Gravity, Urine: 1.02 (ref 1.005–1.030)
UROBILINOGEN UA: 0.2 mg/dL (ref 0.0–1.0)
pH: 6.5 (ref 5.0–8.0)

## 2015-04-03 LAB — CBC
HEMATOCRIT: 25.3 % — AB (ref 36.0–46.0)
Hemoglobin: 8.7 g/dL — ABNORMAL LOW (ref 12.0–15.0)
MCH: 30.1 pg (ref 26.0–34.0)
MCHC: 34.4 g/dL (ref 30.0–36.0)
MCV: 87.5 fL (ref 78.0–100.0)
Platelets: 186 10*3/uL (ref 150–400)
RBC: 2.89 MIL/uL — AB (ref 3.87–5.11)
RDW: 13.1 % (ref 11.5–15.5)
WBC: 6.2 10*3/uL (ref 4.0–10.5)

## 2015-04-03 LAB — WET PREP, GENITAL
Clue Cells Wet Prep HPF POC: NONE SEEN
TRICH WET PREP: NONE SEEN

## 2015-04-03 LAB — RAPID URINE DRUG SCREEN, HOSP PERFORMED
Amphetamines: NOT DETECTED
Barbiturates: NOT DETECTED
Benzodiazepines: NOT DETECTED
Cocaine: POSITIVE — AB
OPIATES: NOT DETECTED
Tetrahydrocannabinol: POSITIVE — AB

## 2015-04-03 LAB — URINE MICROSCOPIC-ADD ON

## 2015-04-03 MED ORDER — PROGESTERONE MICRONIZED 200 MG PO CAPS
200.0000 mg | ORAL_CAPSULE | Freq: Every day | ORAL | Status: DC
  Administered 2015-04-03: 200 mg via VAGINAL
  Filled 2015-04-03: qty 1

## 2015-04-03 MED ORDER — PRENATAL MULTIVITAMIN CH
1.0000 | ORAL_TABLET | Freq: Every day | ORAL | Status: DC
  Administered 2015-04-03 – 2015-04-04 (×2): 1 via ORAL
  Filled 2015-04-03 (×3): qty 1

## 2015-04-03 MED ORDER — NIFEDIPINE ER OSMOTIC RELEASE 30 MG PO TB24: 30.0000 mg | ORAL_TABLET | Freq: Every day | ORAL | Status: DC

## 2015-04-03 MED ORDER — NIFEDIPINE 10 MG PO CAPS
10.0000 mg | ORAL_CAPSULE | Freq: Once | ORAL | Status: AC
  Administered 2015-04-03: 10 mg via ORAL
  Filled 2015-04-03: qty 1

## 2015-04-03 MED ORDER — METOCLOPRAMIDE HCL 10 MG PO TABS: 10.0000 mg | ORAL_TABLET | Freq: Four times a day (QID) | ORAL | Status: DC | PRN

## 2015-04-03 MED ORDER — NIFEDIPINE ER OSMOTIC RELEASE 30 MG PO TB24
30.0000 mg | ORAL_TABLET | Freq: Every day | ORAL | Status: DC
  Administered 2015-04-03: 30 mg via ORAL
  Filled 2015-04-03: qty 1

## 2015-04-03 MED ORDER — NIFEDIPINE ER OSMOTIC RELEASE 30 MG PO TB24
60.0000 mg | ORAL_TABLET | Freq: Every day | ORAL | Status: DC
  Administered 2015-04-04: 60 mg via ORAL
  Filled 2015-04-03: qty 2

## 2015-04-03 MED ORDER — ACETAMINOPHEN 325 MG PO TABS: 650.0000 mg | ORAL_TABLET | ORAL | Status: DC | PRN

## 2015-04-03 MED ORDER — SODIUM CHLORIDE 0.9 % IV SOLN
510.0000 mg | Freq: Once | INTRAVENOUS | Status: AC
  Administered 2015-04-03: 510 mg via INTRAVENOUS
  Filled 2015-04-03: qty 17

## 2015-04-03 MED ORDER — NIFEDIPINE ER OSMOTIC RELEASE 30 MG PO TB24
30.0000 mg | ORAL_TABLET | Freq: Once | ORAL | Status: AC
Start: 2015-04-03 — End: 2015-04-03
  Administered 2015-04-03: 30 mg via ORAL
  Filled 2015-04-03: qty 1

## 2015-04-03 MED ORDER — LACTATED RINGERS IV SOLN
INTRAVENOUS | Status: DC
  Administered 2015-04-03: 18:00:00 via INTRAVENOUS

## 2015-04-03 MED ORDER — CALCIUM CARBONATE ANTACID 500 MG PO CHEW
2.0000 | CHEWABLE_TABLET | ORAL | Status: DC | PRN
  Filled 2015-04-03: qty 2

## 2015-04-03 MED ORDER — DOCUSATE SODIUM 100 MG PO CAPS
100.0000 mg | ORAL_CAPSULE | Freq: Every day | ORAL | Status: DC
  Administered 2015-04-04: 100 mg via ORAL
  Filled 2015-04-03 (×2): qty 1

## 2015-04-03 MED ORDER — ZOLPIDEM TARTRATE 5 MG PO TABS: 5.0000 mg | ORAL_TABLET | Freq: Every evening | ORAL | Status: DC | PRN
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
## 2015-04-03 NOTE — MAU Note (Addendum)
Patient presents to MAU with c/o abdominal cramping and vaginal bleeding. Patient states noticed light red tinge on toilet paper when wipes. No bleeding noted on pad in triage. Denies LOF. +FM. Patient rates abdominal pain 4/10 while eating [place] biscuit.

## 2015-04-03 NOTE — Consult Note (Signed)
Asked by Dr Soyla MurphyAcosta/Stinson to speak to [name] to discuss outcome of preterm pregnancy at 33 weeks. Chart reviewed. She was admitted in PTL with intact membranes. Other complications during pregnancy include: history of marijuana use.  Currently on vaginal progesterone.  She had a 35 wk preterm in the NICU last year, stayed for 2 1/2 weeks, doing well.  I discussed common morbidities associated with this gestation such as respiratory distress, immature suck/swallow and immature temp control. Discussed need for temp support and gavage feeding and expected LOS. She is very familiar with these issues due to her recent preterm delivery.   I also discussed breast feeding and benefits.   I spent 20 minutes with this consult, more than 50% of the time was with face-to-face counseling with [name].   Christy Garfinkelita Q Dakoda Bassette, MD  Neonatologist
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
## 2015-04-03 NOTE — Progress Notes (Addendum)
S: very comfortable, no complaints  O: BP 121/55 mmHg  Pulse 91  Temp(Src) 98.4 F (36.9 C) (Oral)  Resp 20  Ht 5\' 6"  (1.676 m)  Wt 180 lb (81.647 kg)  BMI 29.07 kg/m2  SpO2 100%  LMP 11/08/2013  Gen: comfortable Dilation: 4 Effacement (%): 80 Cervical Position: Middle Station: Ballotable Exam by:: Williams CNM No change from my exam, however at this time bulging bag noted  UDS+ THC and amphetamines   A&P: 29 yo G2P0101 at 643w0d with hx of PTL here with vaginal spotting and contractions - patient very comfortable, does not feel contractions, however given exam with bulging bag will continue observation until tomorrow.  Discharge if no change.  Patient very agreeable with plan. - procardia increased earlier today after intermittent episodes of regular contractions.  Continue with 60mg  daily, no indication for magnesium at this time as she is not laboring.  Perry MountACOSTA,[place] ROCIO, MD 7:53 PM

## 2015-04-04 MED ORDER — NIFEDIPINE ER OSMOTIC RELEASE 30 MG PO TB24
30.0000 mg | ORAL_TABLET | Freq: Two times a day (BID) | ORAL | Status: DC
  Filled 2015-04-04: qty 1

## 2015-04-04 MED ORDER — NIFEDIPINE ER 60 MG PO TB24: 60.0000 mg | ORAL_TABLET | Freq: Every day | ORAL | Status: DC

## 2015-04-04 NOTE — Discharge Instructions (Signed)

## 2015-04-04 NOTE — Discharge Summary (Signed)
Physician Discharge Summary  Patient ID: Christy West M Gamarra MRN: [id number] DOB/AGE: 04/11/1986 29 y.o.  Admit date: 04/03/2015 Discharge date: 04/04/2015  Admission Diagnoses:  Discharge Diagnoses:  Active Problems:   Preterm labor   Discharged Condition: good  Hospital Course: Admitted dilated to 4cm concerning for preterm labor, denied feeling contractions.  Received procardia 30mg , noted to have uterine irriability and episode of regular contractions <q404min, given second dose of procardia.  Again did not feel any contractions, on exam essentially no cervical change but had bulging membranes.  This morning no cervical change in fact cervix thicker on exam.  Consults: MFM  Significant Diagnostic Studies: UDS: THC, cocaine  Treatments: procardia, prometrium  Discharge Exam: Blood pressure 96/51, pulse 94, temperature 98.5 F (36.9 C), temperature source Oral, resp. rate 18, height 5\' 6"  (1.676 m), weight 180 lb (81.647 kg), last menstrual period 11/08/2013, SpO2 100 %, not currently breastfeeding. General appearance: alert, cooperative and no distress Pelvic: external genitalia normal, no adnexal masses or tenderness, no cervical motion tenderness, rectovaginal septum normal, uterus normal size, shape, and consistency and vagina normal without discharge  Dilation: 4 Effacement (%): 50 Cervical Position: Middle Station: Ballotable Exam by:: Dr. Loreta AveAcosta   Disposition: 01-Home or Self Care  Discharge Instructions    Diet - low sodium heart healthy    Complete by:  As directed      Increase activity slowly    Complete by:  As directed             Medication List    TAKE these medications        metoCLOPramide 10 MG tablet  Commonly known as:  REGLAN  Take 1 tablet (10 mg total) by mouth every 6 (six) hours.     NIFEdipine 60 MG 24 hr tablet  Commonly known as:  PROCARDIA-XL/ADALAT CC  Take 1 tablet (60 mg total) by mouth daily.     progesterone 200 MG capsule   Commonly known as:  PROMETRIUM  Place one capsule vaginally at bedtime           Follow-up Information    [facility] week.   Specialty:  Obstetrics and Gynecology   Contact information:   [address] [place] [id number] [phone]      Signed: Perry MountCOSTA,[place]va [place] ROCIO 04/04/2015, 8:04 AM

## 2015-04-04 NOTE — Progress Notes (Signed)
Dr Chase CallerAcusta at bedside to evaluate patient for discharge.  She performed SVE and found no change.  Patient will be discharged home.  She was cancelled on drug use and discharge instructions by Dr Chase CallerAcusta.

## 2015-04-05 ENCOUNTER — Inpatient Hospital Stay (HOSPITAL_COMMUNITY)
Admission: EM | Admit: 2015-04-05 | Discharge: 2015-04-05 | Disposition: A | Discharge status: Home / Self Care | Payer: Medicaid Other | Source: Ambulatory Visit | Attending: Obstetrics and Gynecology | Admitting: Obstetrics and Gynecology

## 2015-04-05 ENCOUNTER — Encounter (HOSPITAL_COMMUNITY): Payer: Self-pay | Admitting: *Deleted

## 2015-04-05 DIAGNOSIS — O99333 Smoking (tobacco) complicating pregnancy, third trimester: Secondary | ICD-10-CM | POA: Insufficient documentation

## 2015-04-05 DIAGNOSIS — F17210 Nicotine dependence, cigarettes, uncomplicated: Secondary | ICD-10-CM | POA: Insufficient documentation

## 2015-04-05 DIAGNOSIS — O368130 Decreased fetal movements, third trimester, not applicable or unspecified: Secondary | ICD-10-CM | POA: Diagnosis not present

## 2015-04-05 DIAGNOSIS — R102 Pelvic and perineal pain: Secondary | ICD-10-CM

## 2015-04-05 DIAGNOSIS — Z3A33 33 weeks gestation of pregnancy: Secondary | ICD-10-CM | POA: Diagnosis not present

## 2015-04-05 DIAGNOSIS — O4703 False labor before 37 completed weeks of gestation, third trimester: Secondary | ICD-10-CM

## 2015-04-05 DIAGNOSIS — O26899 Other specified pregnancy related conditions, unspecified trimester: Secondary | ICD-10-CM

## 2015-04-05 LAB — URINALYSIS, ROUTINE W REFLEX MICROSCOPIC
BILIRUBIN URINE: NEGATIVE
Glucose, UA: NEGATIVE mg/dL
Hgb urine dipstick: NEGATIVE
Ketones, ur: NEGATIVE mg/dL
NITRITE: NEGATIVE
PH: 6.5 (ref 5.0–8.0)
PROTEIN: NEGATIVE mg/dL
Specific Gravity, Urine: 1.02 (ref 1.005–1.030)
Urobilinogen, UA: 0.2 mg/dL (ref 0.0–1.0)

## 2015-04-05 LAB — URINE MICROSCOPIC-ADD ON

## 2015-04-05 LAB — CULTURE, BETA STREP (GROUP B ONLY)

## 2015-04-05 MED ORDER — TERBUTALINE SULFATE 1 MG/ML IJ SOLN
0.2500 mg | Freq: Once | INTRAMUSCULAR | Status: AC
  Administered 2015-04-05: 0.25 mg via SUBCUTANEOUS
  Filled 2015-04-05: qty 1

## 2015-04-05 MED ORDER — NIFEDIPINE 10 MG PO CAPS
10.0000 mg | ORAL_CAPSULE | ORAL | Status: DC | PRN
  Administered 2015-04-05: 10 mg via ORAL
  Filled 2015-04-05: qty 1

## 2015-04-05 MED ORDER — PRENATAL VITAMINS PLUS 27-1 MG PO TABS: 1.0000 | ORAL_TABLET | Freq: Every day | ORAL | Status: DC

## 2015-04-05 NOTE — Progress Notes (Signed)
Dr Nadine CountsGottschalk on unit

## 2015-04-05 NOTE — Discharge Instructions (Signed)
Preterm Labor Information Preterm labor is when labor starts before you are [redacted] weeks pregnant. The normal length of pregnancy is 39 to 41 weeks.  CAUSES  The cause of preterm labor is not often known. The most common known cause is infection. RISK FACTORS  Having a history of preterm labor.  Having your water break before it should.  Having a placenta that covers the opening of the cervix.  Having a placenta that breaks away from the uterus.  Having a cervix that is too weak to hold the baby in the uterus.  Having too much fluid in the amniotic sac.  Taking drugs or smoking while pregnant.  Not gaining enough weight while pregnant.  Being younger than 18 and older than 28 years old.  Having a low income.  Being African American. SYMPTOMS  Period-like cramps, belly (abdominal) pain, or back pain.  Contractions that are regular, as often as six in an hour. They may be mild or painful.  Contractions that start at the top of the belly. They then move to the lower belly and back.  Lower belly pressure that seems to get stronger.  Bleeding from the vagina.  Fluid leaking from the vagina. TREATMENT  Treatment depends on:  Your condition.  The condition of your baby.  How many weeks pregnant you are. Your doctor may have you:  Take medicine to stop contractions.  Stay in bed except to use the restroom (bed rest).  Stay in the hospital. WHAT SHOULD YOU DO IF YOU THINK YOU ARE IN PRETERM LABOR? Call your doctor right away. You need to go to the hospital right away.  HOW CAN YOU PREVENT PRETERM LABOR IN FUTURE PREGNANCIES?  Stop smoking, if you smoke.  Maintain healthy weight gain.  Do not take drugs or be around chemicals that are not needed.  Tell your doctor if you think you have an infection.  Tell your doctor if you had a preterm labor before. Document Released: 02/24/2009 Document Revised: 09/18/2013 Document Reviewed: 02/24/2009 ExitCare Patient  Information 2015 [employer]. This information is not intended to replace advice given to you by your health care provider. Make sure you discuss any questions you have with your health care provider.  

## 2015-04-05 NOTE — Progress Notes (Signed)
Dr Nadine CountsGottschalk on unit. Aware of pt's b/p when 2nd Procardia given

## 2015-04-05 NOTE — Progress Notes (Signed)
Pt sleeping and states does not feel any cramping

## 2015-04-05 NOTE — Progress Notes (Signed)
Written and verbal d/c instructions given and understanding voiced. Importance stressed to taking Procardia as directed

## 2015-04-05 NOTE — MAU Note (Signed)
Has not felt FM since 2000. Cramping since 2400. Denies LOF or bleeding

## 2015-04-05 NOTE — Progress Notes (Signed)
Pt dozing.
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
## 2015-04-06 LAB — GC/CHLAMYDIA PROBE AMP (~~LOC~~) NOT AT ARMC
Chlamydia: NEGATIVE
NEISSERIA GONORRHEA: NEGATIVE

## 2015-04-10 ENCOUNTER — Ambulatory Visit (INDEPENDENT_AMBULATORY_CARE_PROVIDER_SITE_OTHER): Payer: Medicaid Other | Admitting: Advanced Practice Midwife

## 2015-04-10 VITALS — BP 96/53 | HR 91 | Temp 98.3°F | Wt 180.9 lb

## 2015-04-10 DIAGNOSIS — O09893 Supervision of other high risk pregnancies, third trimester: Secondary | ICD-10-CM

## 2015-04-10 DIAGNOSIS — D649 Anemia, unspecified: Secondary | ICD-10-CM

## 2015-04-10 DIAGNOSIS — O09213 Supervision of pregnancy with history of pre-term labor, third trimester: Secondary | ICD-10-CM

## 2015-04-10 DIAGNOSIS — O99013 Anemia complicating pregnancy, third trimester: Secondary | ICD-10-CM

## 2015-04-10 DIAGNOSIS — O2342 Unspecified infection of urinary tract in pregnancy, second trimester: Secondary | ICD-10-CM

## 2015-04-10 DIAGNOSIS — O99322 Drug use complicating pregnancy, second trimester: Secondary | ICD-10-CM

## 2015-04-10 LAB — POCT URINALYSIS DIP (DEVICE)
Bilirubin Urine: NEGATIVE
Glucose, UA: NEGATIVE mg/dL
Hgb urine dipstick: NEGATIVE
KETONES UR: NEGATIVE mg/dL
Nitrite: NEGATIVE
PH: 7 (ref 5.0–8.0)
Protein, ur: NEGATIVE mg/dL
Specific Gravity, Urine: 1.015 (ref 1.005–1.030)
UROBILINOGEN UA: 0.2 mg/dL (ref 0.0–1.0)

## 2015-04-10 MED ORDER — FERROUS SULFATE 325 (65 FE) MG PO TABS: 325.0000 mg | ORAL_TABLET | Freq: Two times a day (BID) | ORAL | Status: DC

## 2015-04-10 NOTE — Progress Notes (Signed)
Pt late for 3 hour gtt, will come Monday 5/2 at 0800, instructions given. ?Needs urine for TOC Leukocytes: Moderate

## 2015-04-10 NOTE — Progress Notes (Signed)
Urine TOC. No Sx. Do not see that she received ABX. She doesn't think she did. UDS pos Cocaine and THC 4/22. Friend in room so did not discuss again. Watch fundal height.

## 2015-04-10 NOTE — Patient Instructions (Signed)
Braxton Hicks Contractions °Contractions of the uterus can occur throughout pregnancy. Contractions are not always a sign that you are in labor.  °WHAT ARE BRAXTON HICKS CONTRACTIONS?  °Contractions that occur before labor are called Braxton Hicks contractions, or false labor. Toward the end of pregnancy (32-34 weeks), these contractions can develop more often and may become more forceful. This is not true labor because these contractions do not result in opening (dilatation) and thinning of the cervix. They are sometimes difficult to tell apart from true labor because these contractions can be forceful and people have different pain tolerances. You should not feel embarrassed if you go to the hospital with false labor. Sometimes, the only way to tell if you are in true labor is for your health care provider to look for changes in the cervix. °If there are no prenatal problems or other health problems associated with the pregnancy, it is completely safe to be sent home with false labor and await the onset of true labor. °HOW CAN YOU TELL THE DIFFERENCE BETWEEN TRUE AND FALSE LABOR? °False Labor °· The contractions of false labor are usually shorter and not as hard as those of true labor.   °· The contractions are usually irregular.   °· The contractions are often felt in the front of the lower abdomen and in the groin.   °· The contractions may go away when you walk around or change positions while lying down.   °· The contractions get weaker and are shorter lasting as time goes on.   °· The contractions do not usually become progressively stronger, regular, and closer together as with true labor.   °True Labor °· Contractions in true labor last 30-70 seconds, become very regular, usually become more intense, and increase in frequency.   °· The contractions do not go away with walking.   °· The discomfort is usually felt in the top of the uterus and spreads to the lower abdomen and low back.   °· True labor can be  determined by your health care provider with an exam. This will show that the cervix is dilating and getting thinner.   °WHAT TO REMEMBER °· Keep up with your usual exercises and follow other instructions given by your health care provider.   °· Take medicines as directed by your health care provider.   °· Keep your regular prenatal appointments.   °· Eat and drink lightly if you think you are going into labor.   °· If Braxton Hicks contractions are making you uncomfortable:   °¨ Change your position from lying down or resting to walking, or from walking to resting.   °¨ Sit and rest in a tub of warm water.   °¨ Drink 2-3 glasses of water. Dehydration may cause these contractions.   °¨ Do slow and deep breathing several times an hour.   °WHEN SHOULD I SEEK IMMEDIATE MEDICAL CARE? °Seek immediate medical care if: °· Your contractions become stronger, more regular, and closer together.   °· You have fluid leaking or gushing from your vagina.   °· You have a fever.   °· You pass blood-tinged mucus.   °· You have vaginal bleeding.   °· You have continuous abdominal pain.   °· You have low back pain that you never had before.   °· You feel your baby's head pushing down and causing pelvic pressure.   °· Your baby is not moving as much as it used to.   °Document Released: 11/28/2005 Document Revised: 12/03/2013 Document Reviewed: 09/09/2013 °ExitCare® Patient Information ©2015 [employer]. This information is not intended to replace advice given to you by your health care   provider. Make sure you discuss any questions you have with your health care provider. ° °

## 2015-04-11 LAB — CULTURE, OB URINE: Colony Count: 8000

## 2015-04-13 ENCOUNTER — Other Ambulatory Visit: Payer: Medicaid Other

## 2015-04-16 ENCOUNTER — Other Ambulatory Visit: Payer: Medicaid Other

## 2015-04-16 DIAGNOSIS — O0930 Supervision of pregnancy with insufficient antenatal care, unspecified trimester: Secondary | ICD-10-CM

## 2015-04-16 NOTE — Progress Notes (Unsigned)
Followup U/S 04/24/15 @ 815a with [facility].

## 2015-04-17 LAB — GLUCOSE TOLERANCE, 3 HOURS
GLUCOSE 3 HOUR GTT: 36 mg/dL — AB (ref 70–144)
GLUCOSE, 1 HOUR-GESTATIONAL: 125 mg/dL (ref 70–189)
GLUCOSE, 2 HOUR-GESTATIONAL: 74 mg/dL (ref 70–164)
Glucose Tolerance, Fasting: 75 mg/dL (ref 70–104)

## 2015-04-20 ENCOUNTER — Other Ambulatory Visit: Payer: Self-pay | Admitting: Advanced Practice Midwife

## 2015-04-20 ENCOUNTER — Encounter (HOSPITAL_COMMUNITY): Payer: Self-pay | Admitting: *Deleted

## 2015-04-20 ENCOUNTER — Inpatient Hospital Stay (HOSPITAL_COMMUNITY)
Admission: AD | Admit: 2015-04-20 | Discharge: 2015-04-21 | Disposition: A | Discharge status: Home / Self Care | Payer: Medicaid Other | Source: Ambulatory Visit | Attending: Obstetrics and Gynecology | Admitting: Obstetrics and Gynecology

## 2015-04-20 DIAGNOSIS — O26893 Other specified pregnancy related conditions, third trimester: Secondary | ICD-10-CM | POA: Diagnosis not present

## 2015-04-20 DIAGNOSIS — O26873 Cervical shortening, third trimester: Secondary | ICD-10-CM

## 2015-04-20 DIAGNOSIS — Z3A35 35 weeks gestation of pregnancy: Secondary | ICD-10-CM | POA: Insufficient documentation

## 2015-04-20 DIAGNOSIS — O368131 Decreased fetal movements, third trimester, fetus 1: Secondary | ICD-10-CM

## 2015-04-20 DIAGNOSIS — O9989 Other specified diseases and conditions complicating pregnancy, childbirth and the puerperium: Secondary | ICD-10-CM | POA: Insufficient documentation

## 2015-04-20 DIAGNOSIS — O99333 Smoking (tobacco) complicating pregnancy, third trimester: Secondary | ICD-10-CM | POA: Diagnosis not present

## 2015-04-20 DIAGNOSIS — F17210 Nicotine dependence, cigarettes, uncomplicated: Secondary | ICD-10-CM | POA: Diagnosis not present

## 2015-04-20 DIAGNOSIS — O09213 Supervision of pregnancy with history of pre-term labor, third trimester: Principal | ICD-10-CM

## 2015-04-20 DIAGNOSIS — N898 Other specified noninflammatory disorders of vagina: Secondary | ICD-10-CM | POA: Diagnosis not present

## 2015-04-20 DIAGNOSIS — O09893 Supervision of other high risk pregnancies, third trimester: Secondary | ICD-10-CM

## 2015-04-20 LAB — URINALYSIS, ROUTINE W REFLEX MICROSCOPIC
Bilirubin Urine: NEGATIVE
Glucose, UA: NEGATIVE mg/dL
Hgb urine dipstick: NEGATIVE
Ketones, ur: NEGATIVE mg/dL
Nitrite: NEGATIVE
Protein, ur: NEGATIVE mg/dL
Specific Gravity, Urine: 1.02 (ref 1.005–1.030)
Urobilinogen, UA: 0.2 mg/dL (ref 0.0–1.0)
pH: 6.5 (ref 5.0–8.0)

## 2015-04-20 LAB — URINE MICROSCOPIC-ADD ON
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
## 2015-04-20 NOTE — Discharge Instructions (Signed)
Vaginal Delivery °During delivery, your health care provider will help you give birth to your baby. During a vaginal delivery, you will work to push the baby out of your vagina. However, before you can push your baby out, a few things need to happen. The opening of your uterus (cervix) has to soften, thin out, and open up (dilate) all the way to 10 cm. Also, your baby has to move down from the uterus into your vagina.  °SIGNS OF LABOR  °Your health care provider will first need to make sure you are in labor. Signs of labor include:  °· Passing what is called the mucous plug before labor begins. This is a small amount of blood-stained mucus. °· Having regular, painful uterine contractions.   °· The time between contractions gets shorter.   °· The discomfort and pain gradually get more intense. °· Contraction pains get worse when walking and do not go away when resting.   °· Your cervix becomes thinner (effacement) and dilates. °BEFORE THE DELIVERY °Once you are in labor and admitted into the hospital or care center, your health care provider may do the following:  °· Perform a complete physical exam. °· Review any complications related to pregnancy or labor.  °· Check your blood pressure, pulse, temperature, and heart rate (vital signs).   °· Determine if, and when, the rupture of amniotic membranes occurred. °· Do a vaginal exam (using a sterile glove and lubricant) to determine:   °¨ The position (presentation) of the baby. Is the baby's head presenting first (vertex) in the birth canal (vagina), or are the feet or buttocks first (breech)?   °¨ The level (station) of the baby's head within the birth canal.   °¨ The effacement and dilatation of the cervix.   °· An electronic fetal monitor is usually placed on your abdomen when you first arrive. This is used to monitor your contractions and the baby's heart rate. °¨ When the monitor is on your abdomen (external fetal monitor), it can only pick up the frequency and  length of your contractions. It cannot tell the strength of your contractions. °¨ If it becomes necessary for your health care provider to know exactly how strong your contractions are or to see exactly what the baby's heart rate is doing, an internal monitor may be inserted into your vagina and uterus. Your health care provider will discuss the benefits and risks of using an internal monitor and obtain your permission before inserting the device. °¨ Continuous fetal monitoring may be needed if you have an epidural, are receiving certain medicines (such as oxytocin), or have pregnancy or labor complications. °· An IV access tube may be placed into a vein in your arm to deliver fluids and medicines if necessary. °THREE STAGES OF LABOR AND DELIVERY °Normal labor and delivery is divided into three stages. °First Stage °This stage starts when you begin to contract regularly and your cervix begins to efface and dilate. It ends when your cervix is completely open (fully dilated). The first stage is the longest stage of labor and can last from 3 hours to 15 hours.  °Several methods are available to help with labor pain. You and your health care provider will decide which option is best for you. Options include:  °· Opioid medicines. These are strong pain medicines that you can get through your IV tube or as a shot into your muscle. These medicines lessen pain but do not make it go away completely.  °· Epidural. A medicine is given through a thin tube that   is inserted in your back. The medicine numbs the lower part of your body and prevents any pain in that area.  Paracervical pain medicine. This is an injection of an anesthetic on each side of your cervix.   You may request natural childbirth, which does not involve the use of pain medicines or an epidural during labor and delivery. Instead, you will use other things, such as breathing exercises, to help cope with the pain. Second Stage The second stage of labor  begins when your cervix is fully dilated at 10 cm. It continues until you push your baby down through the birth canal and the baby is born. This stage can take only minutes or several hours.  The location of your baby's head as it moves through the birth canal is reported as a number called a station. If the baby's head has not started its descent, the station is described as being at minus 3 (-3). When your baby's head is at the zero station, it is at the middle of the birth canal and is engaged in the pelvis. The station of your baby helps indicate the progress of the second stage of labor.  When your baby is born, your health care provider may hold the baby with his or her head lowered to prevent amniotic fluid, mucus, and blood from getting into the baby's lungs. The baby's mouth and nose may be suctioned with a small bulb syringe to remove any additional fluid.  Your health care provider may then place the baby on your stomach. It is important to keep the baby from getting cold. To do this, the health care provider will dry the baby off, place the baby directly on your skin (with no blankets between you and the baby), and cover the baby with warm, dry blankets.   The umbilical cord is cut. Third Stage During the third stage of labor, your health care provider will deliver the placenta (afterbirth) and make sure your bleeding is under control. The delivery of the placenta usually takes about 5 minutes but can take up to 30 minutes. After the placenta is delivered, a medicine may be given either by IV or injection to help contract the uterus and control bleeding. If you are planning to breastfeed, you can try to do so now. After you deliver the placenta, your uterus should contract and get very firm. If your uterus does not remain firm, your health care provider will massage it. This is important because the contraction of the uterus helps cut off bleeding at the site where the placenta was attached  to your uterus. If your uterus does not contract properly and stay firm, you may continue to bleed heavily. If there is a lot of bleeding, medicines may be given to contract the uterus and stop the bleeding.  Document Released: 09/06/2008 Document Revised: 04/14/2014 Document Reviewed: 05/19/2013 [facility] Patient Information 2015 [place]. This information is not intended to replace advice given to you by your health care provider. Make sure you discuss any questions you have with your health care provider.   Third Trimester of Pregnancy The third trimester is from week 29 through week 42, months 7 through 9. The third trimester is a time when the fetus is growing rapidly. At the end of the ninth month, the fetus is about 20 inches in length and weighs 6-10 pounds.  BODY CHANGES Your body goes through many changes during pregnancy. The changes vary from woman to woman.   Your weight  will continue to increase. You can expect to gain 25-35 pounds (11-16 kg) by the end of the pregnancy.  You may begin to get stretch marks on your hips, abdomen, and breasts.  You may urinate more often because the fetus is moving lower into your pelvis and pressing on your bladder.  You may develop or continue to have heartburn as a result of your pregnancy.  You may develop constipation because certain hormones are causing the muscles that push waste through your intestines to slow down.  You may develop hemorrhoids or swollen, bulging veins (varicose veins).  You may have pelvic pain because of the weight gain and pregnancy hormones relaxing your joints between the bones in your pelvis. Backaches may result from overexertion of the muscles supporting your posture.  You may have changes in your hair. These can include thickening of your hair, rapid growth, and changes in texture. Some women also have hair loss during or after pregnancy, or hair that feels dry or thin. Your hair will most likely return to  normal after your baby is born.  Your breasts will continue to grow and be tender. A yellow discharge may leak from your breasts called colostrum.  Your belly button may stick out.  You may feel short of breath because of your expanding uterus.  You may notice the fetus "dropping," or moving lower in your abdomen.  You may have a bloody mucus discharge. This usually occurs a few days to a week before labor begins.  Your cervix becomes thin and soft (effaced) near your due date. WHAT TO EXPECT AT YOUR PRENATAL EXAMS  You will have prenatal exams every 2 weeks until week 36. Then, you will have weekly prenatal exams. During a routine prenatal visit:  You will be weighed to make sure you and the fetus are growing normally.  Your blood pressure is taken.  Your abdomen will be measured to track your baby's growth.  The fetal heartbeat will be listened to.  Any test results from the previous visit will be discussed.  You may have a cervical check near your due date to see if you have effaced. At around 36 weeks, your caregiver will check your cervix. At the same time, your caregiver will also perform a test on the secretions of the vaginal tissue. This test is to determine if a type of bacteria, Group B streptococcus, is present. Your caregiver will explain this further. Your caregiver may ask you:  What your birth plan is.  How you are feeling.  If you are feeling the baby move.  If you have had any abnormal symptoms, such as leaking fluid, bleeding, severe headaches, or abdominal cramping.  If you have any questions. Other tests or screenings that may be performed during your third trimester include:  Blood tests that check for low iron levels (anemia).  Fetal testing to check the health, activity level, and growth of the fetus. Testing is done if you have certain medical conditions or if there are problems during the pregnancy. FALSE LABOR You may feel small, irregular  contractions that eventually go away. These are called Braxton Hicks contractions, or false labor. Contractions may last for hours, days, or even weeks before true labor sets in. If contractions come at regular intervals, intensify, or become painful, it is best to be seen by your caregiver.  SIGNS OF LABOR   Menstrual-like cramps.  Contractions that are 5 minutes apart or less.  Contractions that start on the top of  the uterus and spread down to the lower abdomen and back.  A sense of increased pelvic pressure or back pain.  A watery or bloody mucus discharge that comes from the vagina. If you have any of these signs before the 37th week of pregnancy, call your caregiver right away. You need to go to the hospital to get checked immediately. HOME CARE INSTRUCTIONS   Avoid all smoking, herbs, alcohol, and unprescribed drugs. These chemicals affect the formation and growth of the baby.  Follow your caregiver's instructions regarding medicine use. There are medicines that are either safe or unsafe to take during pregnancy.  Exercise only as directed by your caregiver. Experiencing uterine cramps is a good sign to stop exercising.  Continue to eat regular, healthy meals.  Wear a good support bra for breast tenderness.  Do not use hot tubs, steam rooms, or saunas.  Wear your seat belt at all times when driving.  Avoid raw meat, uncooked cheese, cat litter boxes, and soil used by cats. These carry germs that can cause birth defects in the baby.  Take your prenatal vitamins.  Try taking a stool softener (if your caregiver approves) if you develop constipation. Eat more high-fiber foods, such as fresh vegetables or fruit and whole grains. Drink plenty of fluids to keep your urine clear or pale yellow.  Take warm sitz baths to soothe any pain or discomfort caused by hemorrhoids. Use hemorrhoid cream if your caregiver approves.  If you develop varicose veins, wear support hose. Elevate  your feet for 15 minutes, 3-4 times a day. Limit salt in your diet.  Avoid heavy lifting, wear low heal shoes, and practice good posture.  Rest a lot with your legs elevated if you have leg cramps or low back pain.  Visit your dentist if you have not gone during your pregnancy. Use a soft toothbrush to brush your teeth and be gentle when you floss.  A sexual relationship may be continued unless your caregiver directs you otherwise.  Do not travel far distances unless it is absolutely necessary and only with the approval of your caregiver.  Take prenatal classes to understand, practice, and ask questions about the labor and delivery.  Make a trial run to the hospital.  Pack your hospital bag.  Prepare the baby's nursery.  Continue to go to all your prenatal visits as directed by your caregiver. SEEK MEDICAL CARE IF:  You are unsure if you are in labor or if your water has broken.  You have dizziness.  You have mild pelvic cramps, pelvic pressure, or nagging pain in your abdominal area.  You have persistent nausea, vomiting, or diarrhea.  You have a bad smelling vaginal discharge.  You have pain with urination. SEEK IMMEDIATE MEDICAL CARE IF:   You have a fever.  You are leaking fluid from your vagina.  You have spotting or bleeding from your vagina.  You have severe abdominal cramping or pain.  You have rapid weight loss or gain.  You have shortness of breath with chest pain.  You notice sudden or extreme swelling of your face, hands, ankles, feet, or legs.  You have not felt your baby move in over an hour.  You have severe headaches that do not go away with medicine.  You have vision changes. Document Released: 11/22/2001 Document Revised: 12/03/2013 Document Reviewed: 01/29/2013 [facility] Patient Information 2015 [place], [place]. This information is not intended to replace advice given to you by your health care provider. Make sure you  discuss any questions you  have with your health care provider.

## 2015-04-23 ENCOUNTER — Inpatient Hospital Stay (HOSPITAL_COMMUNITY)
Admission: AD | Admit: 2015-04-23 | Discharge: 2015-04-26 | DRG: 775 | Disposition: A | Discharge status: Home / Self Care | Payer: Medicaid Other | Source: Ambulatory Visit | Attending: Obstetrics & Gynecology | Admitting: Obstetrics & Gynecology

## 2015-04-23 ENCOUNTER — Encounter (HOSPITAL_COMMUNITY): Payer: Self-pay | Admitting: *Deleted

## 2015-04-23 DIAGNOSIS — O99322 Drug use complicating pregnancy, second trimester: Secondary | ICD-10-CM

## 2015-04-23 DIAGNOSIS — O4290 Premature rupture of membranes, unspecified as to length of time between rupture and onset of labor, unspecified weeks of gestation: Secondary | ICD-10-CM | POA: Diagnosis present

## 2015-04-23 DIAGNOSIS — Z3A35 35 weeks gestation of pregnancy: Secondary | ICD-10-CM | POA: Diagnosis present

## 2015-04-23 DIAGNOSIS — O0933 Supervision of pregnancy with insufficient antenatal care, third trimester: Secondary | ICD-10-CM

## 2015-04-23 DIAGNOSIS — F1290 Cannabis use, unspecified, uncomplicated: Secondary | ICD-10-CM | POA: Diagnosis present

## 2015-04-23 DIAGNOSIS — O99324 Drug use complicating childbirth: Secondary | ICD-10-CM | POA: Diagnosis present

## 2015-04-23 DIAGNOSIS — O42913 Preterm premature rupture of membranes, unspecified as to length of time between rupture and onset of labor, third trimester: Secondary | ICD-10-CM | POA: Diagnosis present

## 2015-04-23 DIAGNOSIS — O99334 Smoking (tobacco) complicating childbirth: Secondary | ICD-10-CM | POA: Diagnosis present

## 2015-04-23 DIAGNOSIS — O0930 Supervision of pregnancy with insufficient antenatal care, unspecified trimester: Secondary | ICD-10-CM

## 2015-04-23 DIAGNOSIS — F1490 Cocaine use, unspecified, uncomplicated: Secondary | ICD-10-CM | POA: Diagnosis present

## 2015-04-23 DIAGNOSIS — O42919 Preterm premature rupture of membranes, unspecified as to length of time between rupture and onset of labor, unspecified trimester: Secondary | ICD-10-CM

## 2015-04-23 DIAGNOSIS — O99013 Anemia complicating pregnancy, third trimester: Secondary | ICD-10-CM

## 2015-04-23 LAB — OB RESULTS CONSOLE GBS: GBS: NEGATIVE

## 2015-04-23 LAB — CBC
HEMATOCRIT: 25.3 % — AB (ref 36.0–46.0)
Hemoglobin: 8.7 g/dL — ABNORMAL LOW (ref 12.0–15.0)
MCH: 30.1 pg (ref 26.0–34.0)
MCHC: 34.4 g/dL (ref 30.0–36.0)
MCV: 87.5 fL (ref 78.0–100.0)
PLATELETS: 247 10*3/uL (ref 150–400)
RBC: 2.89 MIL/uL — ABNORMAL LOW (ref 3.87–5.11)
RDW: 14 % (ref 11.5–15.5)
WBC: 7 10*3/uL (ref 4.0–10.5)

## 2015-04-23 LAB — GROUP B STREP BY PCR: GROUP B STREP BY PCR: NEGATIVE

## 2015-04-23 LAB — POCT FERN TEST: POCT Fern Test: POSITIVE

## 2015-04-23 MED ORDER — LACTATED RINGERS IV SOLN: 500.0000 mL | INTRAVENOUS | Status: DC | PRN

## 2015-04-23 MED ORDER — LACTATED RINGERS IV SOLN
INTRAVENOUS | Status: DC
  Administered 2015-04-23: 23:00:00 via INTRAVENOUS

## 2015-04-23 NOTE — MAU Note (Signed)
Denies Contractions Denies red vaginal bleeding.  Positive fetal movement Complains of SROM/LOF at 2100 today Denies any infections/complications of pregnancy  GBS unknown per patient
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
## 2015-04-23 NOTE — MAU Note (Signed)
Pt reports ROM at 2100, denies pain

## 2015-04-24 ENCOUNTER — Encounter (HOSPITAL_COMMUNITY): Payer: Self-pay | Admitting: *Deleted

## 2015-04-24 ENCOUNTER — Inpatient Hospital Stay (HOSPITAL_COMMUNITY): Payer: Medicaid Other | Admitting: Anesthesiology

## 2015-04-24 ENCOUNTER — Ambulatory Visit (HOSPITAL_COMMUNITY): Payer: Medicaid Other

## 2015-04-24 ENCOUNTER — Encounter: Payer: Medicaid Other | Admitting: Family Medicine

## 2015-04-24 DIAGNOSIS — F1290 Cannabis use, unspecified, uncomplicated: Secondary | ICD-10-CM | POA: Diagnosis present

## 2015-04-24 DIAGNOSIS — Z3A35 35 weeks gestation of pregnancy: Secondary | ICD-10-CM | POA: Diagnosis present

## 2015-04-24 DIAGNOSIS — O0933 Supervision of pregnancy with insufficient antenatal care, third trimester: Secondary | ICD-10-CM | POA: Diagnosis not present

## 2015-04-24 DIAGNOSIS — O99324 Drug use complicating childbirth: Secondary | ICD-10-CM | POA: Diagnosis present

## 2015-04-24 DIAGNOSIS — F1490 Cocaine use, unspecified, uncomplicated: Secondary | ICD-10-CM | POA: Diagnosis present

## 2015-04-24 DIAGNOSIS — O42913 Preterm premature rupture of membranes, unspecified as to length of time between rupture and onset of labor, third trimester: Secondary | ICD-10-CM | POA: Diagnosis present

## 2015-04-24 DIAGNOSIS — O99334 Smoking (tobacco) complicating childbirth: Secondary | ICD-10-CM | POA: Diagnosis present

## 2015-04-24 DIAGNOSIS — O4290 Premature rupture of membranes, unspecified as to length of time between rupture and onset of labor, unspecified weeks of gestation: Secondary | ICD-10-CM | POA: Diagnosis present

## 2015-04-24 LAB — RAPID URINE DRUG SCREEN, HOSP PERFORMED
Amphetamines: NOT DETECTED
BARBITURATES: NOT DETECTED
BENZODIAZEPINES: NOT DETECTED
Cocaine: NOT DETECTED
OPIATES: NOT DETECTED
Tetrahydrocannabinol: NOT DETECTED

## 2015-04-24 LAB — RPR: RPR Ser Ql: NONREACTIVE

## 2015-04-24 LAB — TYPE AND SCREEN
ABO/RH(D): AB POS
Antibody Screen: NEGATIVE

## 2015-04-24 MED ORDER — ONDANSETRON HCL 4 MG PO TABS: 4.0000 mg | ORAL_TABLET | ORAL | Status: DC | PRN

## 2015-04-24 MED ORDER — LIDOCAINE HCL (PF) 1 % IJ SOLN
INTRAMUSCULAR | Status: DC | PRN
  Administered 2015-04-24: 4 mL

## 2015-04-24 MED ORDER — EPHEDRINE 5 MG/ML INJ
10.0000 mg | INTRAVENOUS | Status: DC | PRN
  Filled 2015-04-24: qty 2

## 2015-04-24 MED ORDER — ZOLPIDEM TARTRATE 5 MG PO TABS: 5.0000 mg | ORAL_TABLET | Freq: Every evening | ORAL | Status: DC | PRN

## 2015-04-24 MED ORDER — SIMETHICONE 80 MG PO CHEW: 80.0000 mg | CHEWABLE_TABLET | ORAL | Status: DC | PRN

## 2015-04-24 MED ORDER — OXYTOCIN 40 UNITS IN LACTATED RINGERS INFUSION - SIMPLE MED: 62.5000 mL/h | INTRAVENOUS | Status: DC | PRN

## 2015-04-24 MED ORDER — MEASLES, MUMPS & RUBELLA VAC ~~LOC~~ INJ
0.5000 mL | INJECTION | Freq: Once | SUBCUTANEOUS | Status: DC
  Filled 2015-04-24: qty 0.5

## 2015-04-24 MED ORDER — PENICILLIN G POTASSIUM 5000000 UNITS IJ SOLR: 2.5000 10*6.[IU] | INTRAVENOUS | Status: DC

## 2015-04-24 MED ORDER — OXYCODONE-ACETAMINOPHEN 5-325 MG PO TABS: 2.0000 | ORAL_TABLET | ORAL | Status: DC | PRN

## 2015-04-24 MED ORDER — DIPHENHYDRAMINE HCL 50 MG/ML IJ SOLN: 12.5000 mg | INTRAMUSCULAR | Status: DC | PRN

## 2015-04-24 MED ORDER — LIDOCAINE HCL (PF) 1 % IJ SOLN
30.0000 mL | INTRAMUSCULAR | Status: DC | PRN
  Filled 2015-04-24: qty 30

## 2015-04-24 MED ORDER — BENZOCAINE-MENTHOL 20-0.5 % EX AERO
1.0000 "application " | INHALATION_SPRAY | CUTANEOUS | Status: DC | PRN
  Administered 2015-04-24: 1 via TOPICAL
  Filled 2015-04-24: qty 56

## 2015-04-24 MED ORDER — FERROUS SULFATE 325 (65 FE) MG PO TABS
325.0000 mg | ORAL_TABLET | Freq: Two times a day (BID) | ORAL | Status: DC
  Administered 2015-04-24 – 2015-04-26 (×4): 325 mg via ORAL
  Filled 2015-04-24 (×5): qty 1

## 2015-04-24 MED ORDER — FLEET ENEMA 7-19 GM/118ML RE ENEM: 1.0000 | ENEMA | RECTAL | Status: DC | PRN

## 2015-04-24 MED ORDER — IBUPROFEN 600 MG PO TABS
600.0000 mg | ORAL_TABLET | Freq: Four times a day (QID) | ORAL | Status: DC
  Administered 2015-04-24 – 2015-04-26 (×11): 600 mg via ORAL
  Filled 2015-04-24 (×11): qty 1

## 2015-04-24 MED ORDER — OXYTOCIN 40 UNITS IN LACTATED RINGERS INFUSION - SIMPLE MED: 62.5000 mL/h | INTRAVENOUS | Status: DC

## 2015-04-24 MED ORDER — ACETAMINOPHEN 325 MG PO TABS: 650.0000 mg | ORAL_TABLET | ORAL | Status: DC | PRN

## 2015-04-24 MED ORDER — ONDANSETRON HCL 4 MG/2ML IJ SOLN: 4.0000 mg | Freq: Four times a day (QID) | INTRAMUSCULAR | Status: DC | PRN

## 2015-04-24 MED ORDER — LANOLIN HYDROUS EX OINT: TOPICAL_OINTMENT | CUTANEOUS | Status: DC | PRN

## 2015-04-24 MED ORDER — WITCH HAZEL-GLYCERIN EX PADS: 1.0000 "application " | MEDICATED_PAD | CUTANEOUS | Status: DC | PRN

## 2015-04-24 MED ORDER — ONDANSETRON HCL 4 MG/2ML IJ SOLN: 4.0000 mg | INTRAMUSCULAR | Status: DC | PRN

## 2015-04-24 MED ORDER — OXYCODONE-ACETAMINOPHEN 5-325 MG PO TABS
1.0000 | ORAL_TABLET | ORAL | Status: DC | PRN
Start: 2015-04-24 — End: 2015-04-26

## 2015-04-24 MED ORDER — PHENYLEPHRINE 40 MCG/ML (10ML) SYRINGE FOR IV PUSH (FOR BLOOD PRESSURE SUPPORT)
80.0000 ug | PREFILLED_SYRINGE | INTRAVENOUS | Status: DC | PRN
  Filled 2015-04-24: qty 20
  Filled 2015-04-24: qty 2

## 2015-04-24 MED ORDER — METHYLERGONOVINE MALEATE 0.2 MG PO TABS
0.2000 mg | ORAL_TABLET | ORAL | Status: DC | PRN
Start: 2015-04-24 — End: 2015-04-26

## 2015-04-24 MED ORDER — SENNOSIDES-DOCUSATE SODIUM 8.6-50 MG PO TABS
2.0000 | ORAL_TABLET | ORAL | Status: DC
  Administered 2015-04-24 – 2015-04-26 (×2): 2 via ORAL
  Filled 2015-04-24 (×2): qty 2

## 2015-04-24 MED ORDER — LIDOCAINE HCL (PF) 1 % IJ SOLN
INTRAMUSCULAR | Status: AC
  Filled 2015-04-24: qty 30

## 2015-04-24 MED ORDER — OXYCODONE-ACETAMINOPHEN 5-325 MG PO TABS: 1.0000 | ORAL_TABLET | ORAL | Status: DC | PRN

## 2015-04-24 MED ORDER — DEXTROSE 5 % IV SOLN: 5.0000 10*6.[IU] | Freq: Once | INTRAVENOUS | Status: DC

## 2015-04-24 MED ORDER — TETANUS-DIPHTH-ACELL PERTUSSIS 5-2.5-18.5 LF-MCG/0.5 IM SUSP: 0.5000 mL | Freq: Once | INTRAMUSCULAR | Status: DC

## 2015-04-24 MED ORDER — CITRIC ACID-SODIUM CITRATE 334-500 MG/5ML PO SOLN: 30.0000 mL | ORAL | Status: DC | PRN

## 2015-04-24 MED ORDER — BISACODYL 10 MG RE SUPP: 10.0000 mg | Freq: Every day | RECTAL | Status: DC | PRN

## 2015-04-24 MED ORDER — OXYTOCIN 40 UNITS IN LACTATED RINGERS INFUSION - SIMPLE MED: 1.0000 m[IU]/min | INTRAVENOUS | Status: DC

## 2015-04-24 MED ORDER — FLEET ENEMA 7-19 GM/118ML RE ENEM: 1.0000 | ENEMA | Freq: Every day | RECTAL | Status: DC | PRN

## 2015-04-24 MED ORDER — FENTANYL CITRATE (PF) 100 MCG/2ML IJ SOLN
50.0000 ug | INTRAMUSCULAR | Status: DC | PRN
  Administered 2015-04-24: 50 ug via INTRAVENOUS
  Filled 2015-04-24: qty 2

## 2015-04-24 MED ORDER — OXYTOCIN BOLUS FROM INFUSION
500.0000 mL | INTRAVENOUS | Status: DC
  Administered 2015-04-24: 500 mL via INTRAVENOUS

## 2015-04-24 MED ORDER — OXYTOCIN 40 UNITS IN LACTATED RINGERS INFUSION - SIMPLE MED
INTRAVENOUS | Status: AC
  Filled 2015-04-24: qty 1000

## 2015-04-24 MED ORDER — DIBUCAINE 1 % RE OINT: 1.0000 "application " | TOPICAL_OINTMENT | RECTAL | Status: DC | PRN

## 2015-04-24 MED ORDER — METHYLERGONOVINE MALEATE 0.2 MG/ML IJ SOLN: 0.2000 mg | INTRAMUSCULAR | Status: DC | PRN

## 2015-04-24 MED ORDER — ACETAMINOPHEN 325 MG PO TABS
650.0000 mg | ORAL_TABLET | ORAL | Status: DC | PRN
Start: 2015-04-24 — End: 2015-04-26

## 2015-04-24 MED ORDER — PRENATAL MULTIVITAMIN CH
1.0000 | ORAL_TABLET | Freq: Every day | ORAL | Status: DC
  Administered 2015-04-25 – 2015-04-26 (×2): 1 via ORAL
  Filled 2015-04-24: qty 1

## 2015-04-24 MED ORDER — DIPHENHYDRAMINE HCL 25 MG PO CAPS: 25.0000 mg | ORAL_CAPSULE | Freq: Four times a day (QID) | ORAL | Status: DC | PRN

## 2015-04-24 MED ORDER — FENTANYL 2.5 MCG/ML BUPIVACAINE 1/10 % EPIDURAL INFUSION (WH - ANES)
14.0000 mL/h | INTRAMUSCULAR | Status: DC | PRN
  Filled 2015-04-24: qty 125

## 2015-04-24 NOTE — Progress Notes (Signed)
Epidural line pulled, tip intact DHerr rn

## 2015-04-24 NOTE — Lactation Note (Addendum)
This note was copied from the chart of Christy West. Lactation Consultation Note  P222, 8836 week old 6lb 6oz. States she has 799 month old that she breastfed and pumped for one month. Mother knows how to express.  Demonstrated and received drops of colostrum. Initially refused pumping per Pulte HomesJennifer RN. Discussed late preterm feeding behavior and the importance of pumping. Mother was willing to allow LC to bring DEBP pump in room and set up.   States "I hate that pump". Offered hand pump but states she would rather pump with DEBP. Fitted mother with pump flanges and mother seems willing to post pump "when she is ready". Reviewed milk storage and cleaning and suggest she post pump 4-6 times day and give baby back volume pumped. Mother latched baby in football hold. Sucks and swallows observed.  Reminded mother to breastfeed baby every 3 hours and keep feedings to 30 min and keep hat on baby. Reviewed how to massage breast to keep baby active. Discussed spoon feeding volume pumped and foley cup use. Provided mother with late preterm information sheet. Mom made aware of O/P services, breastfeeding support groups, community resources, and our phone # for post-discharge questions.  Wrote feeding times on white board as a reminder to mother.    Patient Name: Christy West Reason for consult: Initial assessment   Maternal Data Has patient been taught Hand Expression?: Yes Does the patient have breastfeeding experience prior to this delivery?: Yes  Feeding Feeding Type: Breast Fed Length of feed: 7 min  LATCH Score/Interventions Latch: Grasps breast easily, tongue down, lips flanged, rhythmical sucking.  Audible Swallowing: Spontaneous and intermittent  Type of Nipple: Everted at rest and after stimulation  Comfort (Breast/Nipple): Soft / non-tender     Hold (Positioning): Assistance needed to correctly position infant at breast and maintain  latch. Intervention(s): Position options;Skin to skin;Support Pillows;Breastfeeding basics reviewed  LATCH Score: 9  Lactation Tools Discussed/Used Pump Review: Milk Storage;Setup, frequency, and cleaning Initiated by:: Dahlia Byesuth Berkelhammer RN Date initiated:: 04/24/15   Consult Status Consult Status: Follow-up Date: 04/25/15 Follow-up type: In-patient    Dahlia ByesBerkelhammer, [name], 5:59 PM

## 2015-04-24 NOTE — Progress Notes (Signed)
   Christy West is a 29 y.o. [phone] at 7047w0d  admitted for active labor, PROM  Subjective: Wants epidural  Objective: Filed Vitals:   04/23/15 2155 04/24/15 0234 04/24/15 0515 04/24/15 0646  BP: 114/72 102/64 100/60 150/96  Pulse: 92 80 79 97  Temp: 98.6 F (37 C) 98 F (36.7 C) 97.9 F (36.6 C)   TempSrc: Oral     Resp: 18 16 18    Height: 5\' 6"  (1.676 m)     Weight: 85.276 kg (188 lb)     SpO2: 99%         FHT:  FHR: 120 bpm, variability: moderate,  accelerations:  Present,  decelerations:  Absent UC:   regular, every 4-5 minutes SVE:   Dilation: 8.5 Effacement (%): 80, 90 Station: -1 Exam by:: D Herr rn   Labs: Lab Results  Component Value Date   WBC 7.0 04/23/2015   HGB 8.7* 04/23/2015   HCT 25.3* 04/23/2015   MCV 87.5 04/23/2015   PLT 247 04/23/2015    Assessment / Plan: Spontaneous labor, progressing normally  L&D did not have a bed until about 30 minutes ago, so pt spent all her time in MAU.  Will get epidural ASAP  Labor: Progressing normally Fetal Wellbeing:  Category I Pain Control:  Epidural Anticipated MOD:  NSVD  CRESENZO-DISHMAN,Jayziah Bankhead 04/24/2015, 7:02 AM

## 2015-04-24 NOTE — Anesthesia Preprocedure Evaluation (Signed)
Anesthesia Evaluation  Patient identified by MRN, date of birth, ID band Patient awake    Reviewed: Allergy & Precautions, NPO status , Patient's Chart, lab work & pertinent test results  History of Anesthesia Complications Negative for: history of anesthetic complications  Airway Mallampati: II  TM Distance: >3 FB Neck ROM: Full    Dental no notable dental hx. (+) Dental Advisory Given   Pulmonary Current Smoker,  breath sounds clear to auscultation  Pulmonary exam normal       Cardiovascular negative cardio ROS Normal cardiovascular examRhythm:Regular Rate:Normal     Neuro/Psych negative neurological ROS  negative psych ROS   GI/Hepatic negative GI ROS, (+)     substance abuse  marijuana use,   Endo/Other  negative endocrine ROS  Renal/GU negative Renal ROS  negative genitourinary   Musculoskeletal negative musculoskeletal ROS (+)   Abdominal   Peds negative pediatric ROS (+)  Hematology negative hematology ROS (+)   Anesthesia Other Findings   Reproductive/Obstetrics (+) Pregnancy                             Anesthesia Physical Anesthesia Plan  ASA: II  Anesthesia Plan: Epidural   Post-op Pain Management:    Induction:   Airway Management Planned:   Additional Equipment:   Intra-op Plan:   Post-operative Plan:   Informed Consent: I have reviewed the patients History and Physical, chart, labs and discussed the procedure including the risks, benefits and alternatives for the proposed anesthesia with the patient or authorized representative who has indicated his/her understanding and acceptance.   Dental advisory given  Plan Discussed with:   Anesthesia Plan Comments: (Discussed with patient that we will attempt epidural placement if she wishes but given that she is already almost fully dilated, it may not have time to become fully effective)        Anesthesia  Quick Evaluation

## 2015-04-24 NOTE — Anesthesia Procedure Notes (Signed)
Epidural Patient location during procedure: OB  Staffing Anesthesiologist: ,  Performed by: anesthesiologist   Preanesthetic Checklist Completed: patient identified, site marked, surgical consent, pre-op evaluation, timeout performed, IV checked, risks and benefits discussed and monitors and equipment checked  Epidural Patient position: sitting Prep: site prepped and draped and DuraPrep Patient monitoring: continuous pulse ox and blood pressure Approach: midline Location: L3-L4 Injection technique: LOR saline  Needle:  Needle type: Tuohy  Needle gauge: 17 G Needle length: 9 cm and 9 Needle insertion depth: 5 cm cm Catheter type: closed end flexible Catheter size: 19 Gauge Catheter at skin depth: 10 cm Test dose: negative  Assessment Events: blood not aspirated, injection not painful, no injection resistance, negative IV test and no paresthesia  Additional Notes Patient identified. Risks/Benefits/Options discussed with patient including but not limited to bleeding, infection, nerve damage, paralysis, failed block, incomplete pain control, headache, blood pressure changes, nausea, vomiting, reactions to medication both or allergic, itching and postpartum back pain. Confirmed with bedside nurse the patient's most recent platelet count. Confirmed with patient that they are not currently taking any anticoagulation, have any bleeding history or any family history of bleeding disorders. Patient expressed understanding and wished to proceed. All questions were answered. Sterile technique was used throughout the entire procedure. Please see nursing notes for vital signs. Test dose was given through epidural catheter and negative prior to continuing to dose epidural or start infusion. Warning signs of high block given to the patient including shortness of breath, tingling/numbness in hands, complete motor block, or any concerning symptoms with instructions to call for help. Patient was  given instructions on fall risk and not to get out of bed. All questions and concerns addressed with instructions to call with any issues or inadequate analgesia.   Epidural was placed very easily and in less than 5 minutes from kit being open to epidural catheter threaded. The patient immediately started to feel pressure and the need to "poop". The catheter was taped in and very shortly after lying the patient down, the baby was delivered.    

## 2015-04-25 NOTE — Anesthesia Postprocedure Evaluation (Signed)
  Anesthesia Post-op Note  Patient: Christy West  Procedure(s) Performed: * No procedures listed *  Patient Location: PACU and Mother/Baby  Anesthesia Type:Epidural  Level of Consciousness: awake, alert  and oriented  Airway and Oxygen Therapy: Patient Spontanous Breathing  Post-op Pain: mild  Post-op Assessment: Post-op Vital signs reviewed  Post-op Vital Signs: Reviewed and stable  Last Vitals:  Filed Vitals:   04/25/15 0603  BP: 106/73  Pulse: 69  Temp: 36.7 C  Resp: 18    Complications: No apparent anesthesia complications

## 2015-04-25 NOTE — Progress Notes (Signed)
Post Partum Day #1 Subjective: no complaints, up ad lib and tolerating PO; breastfeeding going slowly so is supplementing now; plans Depo for contraception  Objective: Blood pressure 106/73, pulse 69, temperature 98 F (36.7 C), temperature source Oral, resp. rate 18, height 5\' 6"  (1.676 m), weight 188 lb (85.276 kg), last menstrual period 11/08/2013, SpO2 99 %, unknown if currently breastfeeding.  Physical Exam:  General: alert, cooperative and no distress  Heart: RRR Lungs: nl effort Lochia: appropriate Uterine Fundus: firm DVT Evaluation: No evidence of DVT seen on physical exam.   Recent Labs  04/23/15 2245  HGB 8.7*  HCT 25.3*    Assessment/Plan: Plan for discharge tomorrow and Social Work consult pending   LOS: 1 day   Cam HaiSHAW, KIMBERLY  CNM  04/25/2015, 7:52 AM

## 2015-04-26 MED ORDER — IBUPROFEN 600 MG PO TABS: 600.0000 mg | ORAL_TABLET | Freq: Four times a day (QID) | ORAL | Status: DC | PRN

## 2015-04-26 NOTE — Lactation Note (Signed)
This note was copied from the chart of [facility]. Lactation Consultation Note  Patient Name: Girl Norvel RichardsDiamond Arduini ZOXWR'UToday's Date: 04/26/2015 Reason for consult: Follow-up assessment with this mom of a LPI. She is pumping and expressing about 5-10 mls now, at 51 hours post partum. She is feeding EBM and then formula to her baby, by bottle. She knows to call for questions/concenrs.   Maternal Data    Feeding Feeding Type: Bottle Fed - Formula Nipple Type: Slow - flow  LATCH Score/Interventions                      Lactation Tools Discussed/Used     Consult Status Consult Status: Complete Follow-up type: Call as needed    Alfred LevinsLee, Kaylyne Axton Anne 04/26/2015, 11:34 AM

## 2015-04-26 NOTE — Discharge Instructions (Signed)

## 2015-04-26 NOTE — Discharge Summary (Signed)
Obstetric Discharge Summary Reason for Admission: onset of labor and rupture of membranes Prenatal Procedures: none Intrapartum Procedures: spontaneous vaginal delivery Postpartum Procedures: none Complications-Operative and Postpartum: none HEMOGLOBIN  Date Value Ref Range Status  04/23/2015 8.7* 12.0 - 15.0 g/dL Final   HCT  Date Value Ref Range Status  04/23/2015 25.3* 36.0 - 46.0 % Final   [name] is a 28yo [id number] admitted @ 35.6wks with PROM/labor on the evening of 5/12. By the morning she progressed to SVD, and by PPD#2 she is deemed to have received the full benefit of her hospital stay and she will be discharged home. She is bottlefeeding and desires Depo for contraception. She had a SW visit while here due to +coc/THC multiple times during the preg, and there are no barriers to d/c.  Physical Exam:  General: alert, cooperative and no distress  Heart: RRR Lungs: nl effort Lochia: appropriate Uterine Fundus: firm DVT Evaluation: No evidence of DVT seen on physical exam.  Discharge Diagnoses: Preterm delivery@36 .0wks  Discharge Information: Date: 04/26/2015 Activity: pelvic rest Diet: routine Medications: PNV and Ibuprofen Condition: stable Instructions: refer to practice specific booklet Discharge to: home Follow-up Information    [facility] Schedule an appointment as soon as possible for a visit in 4 weeks.   Why:  For your postpartum appointment. You may also schedule a visit sooner to get your Depo injection.   Contact information:   [address] [place] [id number] [phone]      Newborn Data: Live born female  Birth Weight: 6 lb 6.1 oz (2895 g) APGAR: 9, 9  Home with mother.  Cam HaiSHAW, KIMBERLY CNM 04/26/2015, 7:45 AM

## 2015-04-28 ENCOUNTER — Ambulatory Visit: Payer: Self-pay

## 2015-04-28 NOTE — Lactation Note (Signed)
This note was copied from the [name]. Lactation Consultation Note  Patient Name: Girl Norvel RichardsDiamond West ZOXWR'UToday's Date: 04/28/2015 Reason for consult: Follow-up assessment  Baby is 614 days old and has mostly been to fed from a bottle , per moms choice,  In the last 6 hours has been to the breast x 2 . Per [facility] RN , found mom to be engorged  With her assessment. Baby has been to both breast and breast are softer. LC assessed with moms permission. Per mom I really don't like feeding the baby at the breast. If i do feed at the breast it will be occasional or I may pump. Per mom has  DEBP at home. LC reviewed what it takes to establish and protect milk supply. And engorgement prevention and tx.  Mother informed of post-discharge support and given phone number to the lactation department, including  services for phone call assistance; out-patient appointments; and breastfeeding support group. List of other  breastfeeding resources in the community given in the handout. Encouraged mother to call for problems or  concerns related to breastfeeding.   Maternal Data    Feeding Feeding Type:  (per mom recently fed on both breast , and breast are feeling better ) Length of feed: 20 min  LATCH Score/Interventions Latch: Grasps breast easily, tongue down, lips flanged, rhythmical sucking.  Audible Swallowing: Spontaneous and intermittent  Type of Nipple: Everted at rest and after stimulation  Comfort (Breast/Nipple): Filling, red/small blisters or bruises, mild/mod discomfort (left breast Mom states knot, engorged upper breast)  Problem noted: Filling;Mild/Moderate discomfort Interventions (Filling): Massage Interventions (Mild/moderate discomfort): Hand expression;Pre-pump if needed  Hold (Positioning): No assistance needed to correctly position infant at breast. Intervention(s): Breastfeeding basics reviewed  LATCH Score: 9  Lactation Tools Discussed/Used     Consult  Status Consult Status: Complete Date: 04/28/15    Kathrin Greathouseorio, Christy West Ann 04/28/2015, 10:39 AM

## 2015-05-28 ENCOUNTER — Ambulatory Visit: Payer: Medicaid Other | Admitting: Obstetrics and Gynecology

## 2015-06-05 ENCOUNTER — Ambulatory Visit: Payer: Medicaid Other | Admitting: Family Medicine

## 2015-06-18 ENCOUNTER — Ambulatory Visit (INDEPENDENT_AMBULATORY_CARE_PROVIDER_SITE_OTHER): Payer: Medicaid Other | Admitting: Family Medicine

## 2015-06-18 ENCOUNTER — Encounter: Payer: Self-pay | Admitting: Family

## 2015-06-18 NOTE — Patient Instructions (Signed)

## 2015-06-18 NOTE — Progress Notes (Signed)
Patient ID: Christy West, female   DOB: [birth year], 29 y.o.   MRN: [id number] Subjective:     Christy West is a 29 y.o. female who presents for a postpartum visit. She is 8 weeks postpartum following a spontaneous vaginal delivery. I have fully reviewed the prenatal and intrapartum course. The delivery was at *36 gestational weeks. Outcome: spontaneous vaginal delivery. Anesthesia: epidural. Postpartum course has been normal. Baby's course has been noraml. Baby is feeding by bottle - Similac Advance. Bleeding thin lochia. Bowel function is normal. Bladder function is normal. Patient is not sexually active. Contraception method is Depo-Provera injections. Postpartum depression screening: negative.  The following portions of the patient's history were reviewed and updated as appropriate: allergies, current medications, past family history, past medical history, past social history, past surgical history and problem list.  Review of Systems Pertinent items are noted in HPI.   Objective:    BP 123/74 mmHg  Pulse 78  Temp(Src) 98.8 F (37.1 C)  Ht 5\' 6"  (1.676 m)  Wt 174 lb 3.2 oz (79.017 kg)  BMI 28.13 kg/m2  Breastfeeding? No  General:  alert, cooperative and no distress  Lungs: clear to auscultation bilaterally  Heart:  regular rate and rhythm, S1, S2 normal, no murmur, click, rub or gallop  Abdomen: soft, non-tender; bowel sounds normal; no masses,  no organomegaly        Assessment:     Normal postpartum exam. Pap smear not done at today's visit.   Plan:    1. Contraception: Depo-Provera injections 2. Follow up in: 3 months or as needed.

## 2015-09-21 ENCOUNTER — Ambulatory Visit: Payer: Self-pay

## 2015-10-02 ENCOUNTER — Emergency Department (HOSPITAL_COMMUNITY)
Admission: EM | Admit: 2015-10-02 | Discharge: 2015-10-02 | Disposition: A | Discharge status: Home / Self Care | Payer: Medicaid Other | Attending: Emergency Medicine | Admitting: Emergency Medicine

## 2015-10-02 ENCOUNTER — Encounter (HOSPITAL_COMMUNITY): Payer: Self-pay | Admitting: Emergency Medicine

## 2015-10-02 DIAGNOSIS — F17210 Nicotine dependence, cigarettes, uncomplicated: Secondary | ICD-10-CM | POA: Insufficient documentation

## 2015-10-02 DIAGNOSIS — K047 Periapical abscess without sinus: Secondary | ICD-10-CM | POA: Diagnosis not present

## 2015-10-02 DIAGNOSIS — R600 Localized edema: Secondary | ICD-10-CM | POA: Diagnosis present

## 2015-10-02 MED ORDER — AMOXICILLIN-POT CLAVULANATE 875-125 MG PO TABS: 1.0000 | ORAL_TABLET | Freq: Two times a day (BID) | ORAL | Status: DC

## 2015-10-02 NOTE — ED Notes (Signed)
Pt from home for eval of swelling to right cheek that started, pt states she felt a "knot to my upper gums" Tuesday. Pt has appt with dentist next week but reports pain getting worse. Pt airway intact, no sob noted. No knot noted by this RN. No tenderness noted to ear or neck just cheek.

## 2015-10-02 NOTE — Discharge Instructions (Signed)
Christy West,  [place]! Please take one tablet twice a day, and follow-up with your dentist as soon as possible. Feel better soon!  S. Lane HackerNicole Damarion Mendizabal, PA-C   Dental Abscess A dental abscess is a collection of pus in or around a tooth. CAUSES This condition is caused by a bacterial infection around the root of the tooth that involves the inner part of the tooth (pulp). It may result from:  Severe tooth decay.  Trauma to the tooth that allows bacteria to enter into the pulp, such as a broken or chipped tooth.  Severe gum disease around a tooth. SYMPTOMS Symptoms of this condition include:  Severe pain in and around the infected tooth.  Swelling and redness around the infected tooth, in the mouth, or in the face.  Tenderness.  Pus drainage.  Bad breath.  Bitter taste in the mouth.  Difficulty swallowing.  Difficulty opening the mouth.  Nausea.  Vomiting.  Chills.  Swollen neck glands.  Fever. DIAGNOSIS This condition is diagnosed with examination of the infected tooth. During the exam, your dentist may tap on the infected tooth. Your dentist will also ask about your medical and dental history and may order X-rays. TREATMENT This condition is treated by eliminating the infection. This may be done with:  Antibiotic medicine.  A root canal. This may be performed to save the tooth.  Pulling (extracting) the tooth. This may also involve draining the abscess. This is done if the tooth cannot be saved. HOME CARE INSTRUCTIONS  Take medicines only as directed by your dentist.  If you were prescribed antibiotic medicine, finish all of it even if you start to feel better.  Rinse your mouth (gargle) often with salt water to relieve pain or swelling.  Do not drive or operate heavy machinery while taking pain medicine.  Do not apply heat to the outside of your mouth.  Keep all follow-up visits as directed by your dentist. This is important. SEEK MEDICAL CARE  IF:  Your pain is worse and is not helped by medicine. SEEK IMMEDIATE MEDICAL CARE IF:  You have a fever or chills.  Your symptoms suddenly get worse.  You have a very bad headache.  You have problems breathing or swallowing.  You have trouble opening your mouth.  You have swelling in your neck or around your eye.   This information is not intended to replace advice given to you by your health care provider. Make sure you discuss any questions you have with your health care provider.   Document Released: 11/28/2005 Document Revised: 04/14/2015 Document Reviewed: 11/25/2014 Elsevier Interactive Patient Education [employer]vier Inc.

## 2015-10-02 NOTE — ED Notes (Signed)
Pt stable, ambulatory, states understanding of discharge instructions 
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
## 2015-10-06 ENCOUNTER — Encounter (HOSPITAL_COMMUNITY): Payer: Self-pay | Admitting: *Deleted

## 2015-10-06 ENCOUNTER — Emergency Department (HOSPITAL_COMMUNITY)
Admission: EM | Admit: 2015-10-06 | Discharge: 2015-10-06 | Disposition: A | Discharge status: Home / Self Care | Payer: Medicaid Other | Attending: Emergency Medicine | Admitting: Emergency Medicine

## 2015-10-06 DIAGNOSIS — Z792 Long term (current) use of antibiotics: Secondary | ICD-10-CM | POA: Diagnosis not present

## 2015-10-06 DIAGNOSIS — Z720 Tobacco use: Secondary | ICD-10-CM | POA: Insufficient documentation

## 2015-10-06 DIAGNOSIS — Z87828 Personal history of other (healed) physical injury and trauma: Secondary | ICD-10-CM | POA: Diagnosis not present

## 2015-10-06 DIAGNOSIS — Z8742 Personal history of other diseases of the female genital tract: Secondary | ICD-10-CM | POA: Insufficient documentation

## 2015-10-06 DIAGNOSIS — M7918 Myalgia, other site: Secondary | ICD-10-CM

## 2015-10-06 DIAGNOSIS — Y999 Unspecified external cause status: Secondary | ICD-10-CM | POA: Diagnosis not present

## 2015-10-06 DIAGNOSIS — Y9389 Activity, other specified: Secondary | ICD-10-CM | POA: Diagnosis not present

## 2015-10-06 DIAGNOSIS — Y92410 Unspecified street and highway as the place of occurrence of the external cause: Secondary | ICD-10-CM | POA: Insufficient documentation

## 2015-10-06 DIAGNOSIS — S4992XA Unspecified injury of left shoulder and upper arm, initial encounter: Secondary | ICD-10-CM | POA: Insufficient documentation

## 2015-10-06 MED ORDER — METHOCARBAMOL 500 MG PO TABS: 500.0000 mg | ORAL_TABLET | Freq: Two times a day (BID) | ORAL | Status: DC

## 2015-10-06 MED ORDER — NAPROXEN 500 MG PO TABS: 500.0000 mg | ORAL_TABLET | Freq: Two times a day (BID) | ORAL | Status: DC

## 2015-10-06 MED ORDER — IBUPROFEN 800 MG PO TABS
800.0000 mg | ORAL_TABLET | Freq: Once | ORAL | Status: AC
  Administered 2015-10-06: 800 mg via ORAL
  Filled 2015-10-06: qty 1
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
## 2015-11-03 NOTE — ED Notes (Signed)
Medical screening examination/treatment/procedure(s) were performed by non-physician practitioner and as supervising physician I was immediately available for consultation/collaboration.   EKG Interpretation None       Lorre NickAnthony Yudith Norlander, MD 11/03/15 1341

## 2016-01-04 ENCOUNTER — Encounter (HOSPITAL_COMMUNITY): Payer: Self-pay | Admitting: Emergency Medicine

## 2016-01-04 ENCOUNTER — Emergency Department (HOSPITAL_COMMUNITY)
Admission: EM | Admit: 2016-01-04 | Discharge: 2016-01-04 | Disposition: A | Discharge status: Home / Self Care | Payer: Medicaid Other | Attending: Emergency Medicine | Admitting: Emergency Medicine

## 2016-01-04 DIAGNOSIS — F17210 Nicotine dependence, cigarettes, uncomplicated: Secondary | ICD-10-CM | POA: Insufficient documentation

## 2016-01-04 DIAGNOSIS — K029 Dental caries, unspecified: Secondary | ICD-10-CM | POA: Diagnosis not present

## 2016-01-04 DIAGNOSIS — K0889 Other specified disorders of teeth and supporting structures: Secondary | ICD-10-CM

## 2016-01-04 DIAGNOSIS — Z79899 Other long term (current) drug therapy: Secondary | ICD-10-CM | POA: Diagnosis not present

## 2016-01-04 MED ORDER — PENICILLIN V POTASSIUM 500 MG PO TABS: 500.0000 mg | ORAL_TABLET | Freq: Four times a day (QID) | ORAL | Status: AC

## 2016-01-04 MED ORDER — MELOXICAM 7.5 MG PO TABS: 7.5000 mg | ORAL_TABLET | Freq: Every day | ORAL | Status: DC
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
## 2016-01-04 NOTE — ED Notes (Signed)
Pt st's she has two teeth that have holes in them.  Pt st's left upper back tooth and right upper back tooth are painful

## 2016-01-04 NOTE — Discharge Instructions (Signed)
Dental Pain  ° ° °Dental pain may be caused by many things, including:  °Tooth decay (cavities or caries). Cavities expose the nerve of your tooth to air and hot or cold temperatures. This can cause pain or discomfort.  °Abscess or infection. A dental abscess is a collection of infected pus from a bacterial infection in the inner part of the tooth (pulp). It usually occurs at the end of the tooth's root.  °Injury.  °An unknown reason (idiopathic). °Your pain may be mild or severe. It may only occur when:  °You are chewing.  °You are exposed to hot or cold temperature.  °You are eating or drinking sugary foods or beverages, such as soda or candy. °Your pain may also be constant.  °HOME CARE INSTRUCTIONS  °Watch your dental pain for any changes. The following actions may help to lessen any discomfort that you are feeling:  °Take medicines only as directed by your dentist.  °If you were prescribed an antibiotic medicine, finish all of it even if you start to feel better.  °Keep all follow-up visits as directed by your dentist. This is important.  °Do not apply heat to the outside of your face.  °Rinse your mouth or gargle with salt water if directed by your dentist. This helps with pain and swelling.  °You can make salt water by adding ¼ tsp of salt to 1 cup of warm water. °Apply ice to the painful area of your face:  °Put ice in a plastic bag.  °Place a towel between your skin and the bag.  °Leave the ice on for 20 minutes, 2-3 times per day. °Avoid foods or drinks that cause you pain, such as:  °Very hot or very cold foods or drinks.  °Sweet or sugary foods or drinks. °SEEK MEDICAL CARE IF:  °Your pain is not controlled with medicines.  °Your symptoms are worse.  °You have new symptoms. °SEEK IMMEDIATE MEDICAL CARE IF:  °You are unable to open your mouth.  °You are having trouble breathing or swallowing.  °You have a fever.  °Your face, neck, or jaw is swollen. °This information is not intended to replace advice  given to you by your health care provider. Make sure you discuss any questions you have with your health care provider.  °Document Released: 11/28/2005 Document Revised: 04/14/2015 Document Reviewed: 11/24/2014  °Elsevier Interactive Patient Education ©2016 Elsevier Inc.  ° ° °Emergency Department Resource Guide °1) Find a Doctor and Pay Out of Pocket °Although you won't have to find out who is covered by your insurance plan, it is a good idea to ask around and get recommendations. You will then need to call the office and see if the doctor you have chosen will accept you as a new patient and what types of options they offer for patients who are self-pay. Some doctors offer discounts or will set up payment plans for their patients who do not have insurance, but you will need to ask so you aren't surprised when you get to your appointment. ° °2) Contact Your Local Health Department °Not all health departments have doctors that can see patients for sick visits, but many do, so it is worth a call to see if yours does. If you don't know where your local health department is, you can check in your phone book. The CDC also has a tool to help you locate your state's health department, and many state websites also have listings of all of their local health departments. ° °  3) Find a Walk-in Clinic °If your illness is not likely to be very severe or complicated, you may want to try a walk in clinic. These are popping up all over the country in pharmacies, drugstores, and shopping centers. They're usually staffed by nurse practitioners or physician assistants that have been trained to treat common illnesses and complaints. They're usually fairly quick and inexpensive. However, if you have serious medical issues or chronic medical problems, these are probably not your best option. ° °No Primary Care Doctor: °- Call [facility] at  832-8000 - they can help you locate a primary care doctor that  accepts your insurance,  provides certain services, etc. °- Physician Referral Service- 1-800-533-3463 ° °Chronic Pain Problems: °Organization         Address  Phone   Notes  °[facility]  (336) 297-2271 Patients need to be referred by their primary care doctor.  ° °Medication Assistance: °Organization         Address  Phone   Notes  °[place] [facility] [address] °[place], [place] [address] (336) 641-8030 --Must be a resident of [place] °-- Must have NO insurance coverage whatsoever (no Medicaid/ Medicare, etc.) °-- The pt. MUST have a primary care doctor that directs their care regularly and follows them in the community °  °[facility]  (866) 331-1348   °[employer]  (888) 892-1162   ° °Agencies that provide inexpensive medical care: °Organization         Address  Phone   Notes  °[facility]  (336) 832-8035   °[facility]    (336) 832-7272   °[facility] [address] °[place], [place] [address] (336) 832-4777   °[facility] [address], °[place] (336) 271-4999   °[facility]    (336) 373-0678   °[facility]    (336) 272-1050   °[facility] ° [address], [place] Phone:  (336) 832-4444, Fax:  (336) 832-4440 Hours of Operation:  9 am - 6 pm, M-F.  Also accepts Medicaid/Medicare and self-pay.  °[facility] ° [address], [place] Phone: (336) 832-3150, Fax: (336) 832-3151. Hours of Operation:  8:30 am - 5:30 pm, M-F.  Also accepts Medicaid and self-pay.  °[facility] [place] [address], [place] Phone: (336) 878-6027   °[facility] [address], [place], [place] (336)723-1848, Ext. 123 Mondays & Thursdays: 7-9 AM.  First 15 patients are seen on a first come, first serve basis. °  ° °Medicaid-accepting [place] Providers: ° °Organization         Address  Phone    Notes  °[facility] [address], [place] (336) 641-2100 Also accepts self-pay patients.  °[facility] [address], [place] ° (336) 856-9996   °[facility] [address], [place] (336) 288-8857   °[facility] [address] [place] [address], [place] (336) 299-7000   °Veita Bland [address], [place]  ° (336) 373-1557 Only accepts [place] Access Medicaid patients after they have their name applied to their card.  ° °Self-Pay (no insurance) in [place]: ° °Organization         Address  Phone   Notes  °Sickle Cell Patients, [facility] [address], [place] (336) 832-1970   °[facility] [address], [place] (336) 832-4400   °[facility] ° [address], [place] (336) 992-4800   °[facility]/Dr. Osei-Bonsu ° [address] [place] [address], [place] or [address], [place] (336) 841-8500 Phone number for both [place] and [place] locations is the same.  °[facility] [address], [place] (336) 299-0000   °[facility] [address] [place] [address], [place] or [address] (336) 852-7530 °(336) 878-2260   °[facility] [address], [place] (336) 350-1642, phone; (336) 294-5005, fax Sees patients 1st and 3rd Saturday of every month.  Must not qualify for public or private insurance (i.e. Medicaid, Medicare, [place] Health Choice, Veterans' Benefits) • Household income should be no more than 200% of the poverty level •The clinic cannot treat you if you are pregnant or think you are pregnant • Sexually transmitted diseases are not treated at the clinic.  ° ° °Dental Care: °Organization         Address  Phone  Notes  °[facility] [facility] [address], [place] (336)  641-6152 Accepts children up to age 21 who are enrolled in Medicaid or [place] Health Choice; pregnant women with a Medicaid card; and children who have applied for Medicaid or [place] Health Choice, but were declined, whose parents can pay a reduced fee at time of service.  °[facility] [place]  [address], [place] (336) 641-7733 Accepts children up to age 21 who are enrolled in Medicaid or [place] Health Choice; pregnant women with a Medicaid card; and children who have applied for Medicaid or [place] Health Choice, but were declined, whose parents can pay a reduced fee at time of service.  °[facility] ° [address], [place] (336) 641-4533 Patients are seen by appointment only. Walk-ins are not accepted. [facility] will see patients 18 years of age and older. °Monday - Tuesday (8am-5pm) °Most Wednesdays (8:30-5pm) °$30 per visit, cash only  °[facility] ° [address], [place] (336) 641-4533 Patients are seen by appointment only. Walk-ins are not accepted. [facility] will see patients 18 years of age and older. °One Wednesday Evening (Monthly: Volunteer Based).  $30 per visit, cash only  °[facility]  (919) 537-3737 for adults; Children under age 4, call Graduate Pediatric Dentistry at (919) [phone]. Children aged 4-14, please call (919) [phone] to request a pediatric application. ° Dental services are provided in all areas of dental care including fillings, crowns and bridges, complete and partial dentures, implants, gum treatment, root canals, and extractions. Preventive care is also provided. Treatment is provided to both adults and children. °Patients are selected via a lottery and there is often a waiting list. °  °[facility] [address], °[place] ° (336) 763-8833 [email or web address] °  °[facility] [address], [place], [place] (336)723-1848, Ext.  123 Second and Fourth Thursday of each month, opens at 6:30 AM; Clinic ends at 9 AM.  Patients are seen on a first-come first-served basis, and a limited number are seen during each clinic.  ° °[facility] ° [address], [place], [place] (336) 723-7904   Eligibility Requirements °You must have lived in [place],   [place], or [place] counties for at least the last three months. °  You cannot be eligible for state or federal sponsored healthcare insurance, including Veterans Administration, Medicaid, or Medicare. °  You generally cannot be eligible for healthcare insurance through your employer.  °  How to apply: °Eligibility screenings are held every Tuesday and Wednesday afternoon from 1:00 pm until 4:00 pm. You do not need an appointment for the interview!  °[facility] [address], [place], [place] 336-631-2330   °[facility]  336-342-8273   °[facility]  336-703-3100   °[facility]  336-570-6415   ° °Behavioral Health Resources in the Community: °Intensive Outpatient Programs °Organization         Address  Phone  Notes  °[facility] [address], [place], [place] 336-878-6098   °[facility] [address], [place], [place] 336-832-9800   °[facility] [address], [place], [place] ° 336-882-2125   °[facility] [address],  °[place], [place] 1-800-853-5163 or 336-641-4981   °Substance Abuse Resources °Organization         Address  Phone  Notes  °[facility]  336-882-2125   °[facility]  336-784-9470   °[facility]  336-285-9073   °[facility]  336-845-3988   °Residential & Outpatient Substance Abuse Program  1-800-659-3381   °Psychological Services °Organization         Address  Phone  Notes  °[facility]  336- 832-9600   °[facility]  336- 378-7881   °[place] [address], [place] 1-800-853-5163 or 336-641-4981   ° °Mobile Crisis Teams °Organization         Address  Phone  Notes  °[facility], Mobile Crisis Care Unit  1-877-626-1772   °Assertive °[facility] ° [address] [place], [place] 336-834-9664   °Sharon DeEsch [address] °[place] 336-554-5454   ° °Self-Help/Support Groups °Organization         Address  Phone             Notes  °[facility] [place] - variety of support groups  336- 373-1402 Call for more information  °Narcotics Anonymous (NA), [facility] [address], °[place]  2 meetings at this location  ° °Residential Treatment Programs °Organization         Address  Phone  Notes  °[facility] [address],    °[place]  1-866-801-8205   °[facility] ° [address], [place], [place] 704-293-8524   °[facility] [address], [place] 336-845-3988 Admissions: 8am-3pm M-F  °[facility] [address]    °[place], [place] 336-841-1104   °[facility] [address], [place], [place] 336-379-7146   °[facility] [address]  °[place], [place] 336-285-9073   °[facility] Intensive Outpatient [address], [place], [place] 336-852-3033   °[facility] [address]  °[place], [place] 1-877-615-2722 or 336-784-9470   °[facility] [address] [place], [place] 336-227-7417 Accepts Medicaid  °[facility] [address]  °[place] 1-800-659-3381 Substance Abuse/Addiction Treatment  ° °[place] Behavioral Health Resources °Organization         Address  Phone  Notes  °[facility]  (888) 581-9988   °Julie Brannon, PhD [address] [place], [place]   (336) 349-5553 or (336) 951-0000   °[facility]   [address] °[place], [place] (336) 349-4454   °[facility] [address], [place], [place] (336) 342-8316 Insurance/Medicaid/sponsorship through [facility]  °[facility] [address]                                    [place], [place] (336) 342-8316 Therapy/tele-psych/case  °[facility] [address]  ° [place], [place] (336) 349-2233    °Dr. Arfeen  (336) 349-4544   °[facility] [place]  [employer] [place] [facility] 1) [address], [place] °2) [address], [place] °3)  [address], [place] (336) 349-3220 °(336) 342-7768 ° °(336) 342-8140   °[place] [facility] (336) 342-1394 or (336) 342-3537 (After Hours)    ° ° ° °

## 2016-06-03 ENCOUNTER — Encounter (HOSPITAL_COMMUNITY): Payer: Self-pay | Admitting: *Deleted

## 2016-06-03 ENCOUNTER — Inpatient Hospital Stay (HOSPITAL_COMMUNITY)
Admission: AD | Admit: 2016-06-03 | Discharge: 2016-06-03 | Disposition: A | Discharge status: Home / Self Care | Payer: Medicaid Other | Source: Ambulatory Visit | Attending: Family Medicine | Admitting: Family Medicine

## 2016-06-03 DIAGNOSIS — F17210 Nicotine dependence, cigarettes, uncomplicated: Secondary | ICD-10-CM | POA: Diagnosis not present

## 2016-06-03 DIAGNOSIS — L989 Disorder of the skin and subcutaneous tissue, unspecified: Secondary | ICD-10-CM | POA: Diagnosis not present

## 2016-06-03 DIAGNOSIS — N949 Unspecified condition associated with female genital organs and menstrual cycle: Secondary | ICD-10-CM | POA: Diagnosis not present

## 2016-06-03 DIAGNOSIS — F1910 Other psychoactive substance abuse, uncomplicated: Secondary | ICD-10-CM | POA: Diagnosis not present

## 2016-06-03 DIAGNOSIS — N72 Inflammatory disease of cervix uteri: Secondary | ICD-10-CM | POA: Diagnosis not present

## 2016-06-03 DIAGNOSIS — N898 Other specified noninflammatory disorders of vagina: Secondary | ICD-10-CM | POA: Diagnosis present

## 2016-06-03 LAB — URINALYSIS, ROUTINE W REFLEX MICROSCOPIC
Bilirubin Urine: NEGATIVE
GLUCOSE, UA: NEGATIVE mg/dL
KETONES UR: 15 mg/dL — AB
NITRITE: NEGATIVE
PROTEIN: NEGATIVE mg/dL
Specific Gravity, Urine: >1.03 — ABNORMAL HIGH (ref 1.005–1.030)
pH: 6 (ref 5.0–8.0)

## 2016-06-03 LAB — WET PREP, GENITAL
Sperm: NONE SEEN
Trich, Wet Prep: NONE SEEN
YEAST WET PREP: NONE SEEN

## 2016-06-03 LAB — RAPID URINE DRUG SCREEN, HOSP PERFORMED
AMPHETAMINES: NOT DETECTED
BARBITURATES: NOT DETECTED
Benzodiazepines: NOT DETECTED
Cocaine: POSITIVE — AB
OPIATES: NOT DETECTED
TETRAHYDROCANNABINOL: POSITIVE — AB

## 2016-06-03 LAB — URINE MICROSCOPIC-ADD ON

## 2016-06-03 LAB — POCT PREGNANCY, URINE: Preg Test, Ur: NEGATIVE

## 2016-06-03 MED ORDER — ACYCLOVIR 400 MG PO TABS: 400.0000 mg | ORAL_TABLET | Freq: Every day | ORAL | Status: DC

## 2016-06-03 MED ORDER — AZITHROMYCIN 250 MG PO TABS
1000.0000 mg | ORAL_TABLET | Freq: Once | ORAL | Status: AC
  Administered 2016-06-03: 1000 mg via ORAL
  Filled 2016-06-03: qty 4

## 2016-06-03 MED ORDER — CEFTRIAXONE SODIUM 250 MG IJ SOLR
250.0000 mg | Freq: Once | INTRAMUSCULAR | Status: AC
  Administered 2016-06-03: 250 mg via INTRAMUSCULAR
  Filled 2016-06-03: qty 250

## 2016-06-03 NOTE — MAU Note (Signed)
RN called patients cell phone number on file with no response; LMOM to return call.

## 2016-06-03 NOTE — Discharge Instructions (Signed)
Take tylenol and ibuprofen as needed for pain.

## 2016-06-03 NOTE — MAU Note (Addendum)
RN entered patients room to giver her prescribed medications; patient and her personal belongings not in room. Patient left prior to medications and discharge. Provider made aware
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
## 2016-06-03 NOTE — MAU Note (Signed)
Vaginal discharge x 1 week, blister on vaginal area x 1 day, unsure of when last period was.

## 2016-06-03 NOTE — MAU Note (Signed)
Patient returned to unit. 

## 2016-06-05 LAB — HIV ANTIBODY (ROUTINE TESTING W REFLEX): HIV Screen 4th Generation wRfx: NONREACTIVE

## 2016-06-06 LAB — RPR: RPR: NONREACTIVE

## 2016-06-06 LAB — GC/CHLAMYDIA PROBE AMP (~~LOC~~) NOT AT ARMC
CHLAMYDIA, DNA PROBE: NEGATIVE
NEISSERIA GONORRHEA: NEGATIVE

## 2016-06-06 LAB — HSV CULTURE AND TYPING

## 2016-07-29 ENCOUNTER — Inpatient Hospital Stay (HOSPITAL_COMMUNITY)
Admission: AD | Admit: 2016-07-29 | Discharge: 2016-07-30 | Disposition: A | Discharge status: Home / Self Care | Payer: Medicaid Other | Source: Ambulatory Visit | Attending: Obstetrics and Gynecology | Admitting: Obstetrics and Gynecology

## 2016-07-29 ENCOUNTER — Encounter (HOSPITAL_COMMUNITY): Payer: Self-pay | Admitting: *Deleted

## 2016-07-29 DIAGNOSIS — N760 Acute vaginitis: Secondary | ICD-10-CM

## 2016-07-29 DIAGNOSIS — A6000 Herpesviral infection of urogenital system, unspecified: Secondary | ICD-10-CM | POA: Diagnosis not present

## 2016-07-29 DIAGNOSIS — F17210 Nicotine dependence, cigarettes, uncomplicated: Secondary | ICD-10-CM | POA: Diagnosis not present

## 2016-07-29 DIAGNOSIS — B009 Herpesviral infection, unspecified: Secondary | ICD-10-CM | POA: Diagnosis present

## 2016-07-29 DIAGNOSIS — B9689 Other specified bacterial agents as the cause of diseases classified elsewhere: Secondary | ICD-10-CM

## 2016-07-29 DIAGNOSIS — Z79899 Other long term (current) drug therapy: Secondary | ICD-10-CM | POA: Insufficient documentation

## 2016-07-29 DIAGNOSIS — N898 Other specified noninflammatory disorders of vagina: Secondary | ICD-10-CM | POA: Diagnosis not present

## 2016-07-29 DIAGNOSIS — A499 Bacterial infection, unspecified: Secondary | ICD-10-CM | POA: Diagnosis not present

## 2016-07-29 HISTORY — DX: Herpesviral infection of urogenital system, unspecified: A60.00

## 2016-07-29 LAB — WET PREP, GENITAL
Sperm: NONE SEEN
TRICH WET PREP: NONE SEEN
Yeast Wet Prep HPF POC: NONE SEEN

## 2016-07-29 LAB — URINE MICROSCOPIC-ADD ON

## 2016-07-29 LAB — URINALYSIS, ROUTINE W REFLEX MICROSCOPIC
Bilirubin Urine: NEGATIVE
Glucose, UA: NEGATIVE mg/dL
Ketones, ur: NEGATIVE mg/dL
NITRITE: NEGATIVE
PROTEIN: NEGATIVE mg/dL
Specific Gravity, Urine: 1.025 (ref 1.005–1.030)
pH: 6 (ref 5.0–8.0)

## 2016-07-29 LAB — POCT PREGNANCY, URINE: PREG TEST UR: NEGATIVE

## 2016-07-29 NOTE — MAU Note (Signed)
Pt stated she has been out of her valtrex for 2 weeks and she feels like she is starting to have an herpes outbreak. Also feels like she has a bacterial infection as well.
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
## 2016-07-30 DIAGNOSIS — N760 Acute vaginitis: Secondary | ICD-10-CM | POA: Diagnosis not present

## 2016-07-30 DIAGNOSIS — A499 Bacterial infection, unspecified: Secondary | ICD-10-CM

## 2016-07-30 DIAGNOSIS — A6000 Herpesviral infection of urogenital system, unspecified: Secondary | ICD-10-CM

## 2016-07-30 MED ORDER — METRONIDAZOLE 0.75 % VA GEL: 1.0000 | Freq: Every day | VAGINAL | 1 refills | Status: DC

## 2016-07-30 MED ORDER — FLUCONAZOLE 150 MG PO TABS: 150.0000 mg | ORAL_TABLET | Freq: Once | ORAL | 3 refills | Status: AC

## 2016-07-30 MED ORDER — VALACYCLOVIR HCL 1 G PO TABS: 1000.0000 mg | ORAL_TABLET | Freq: Two times a day (BID) | ORAL | 5 refills | Status: DC

## 2016-07-30 NOTE — Discharge Instructions (Signed)
Bacterial Vaginosis Bacterial vaginosis is an infection of the vagina. It happens when too many germs (bacteria) grow in the vagina. Having this infection puts you at risk for getting other infections from sex. Treating this infection can help lower your risk for other infections, such as:   Chlamydia.  Gonorrhea.  HIV.  Herpes. HOME CARE  Take your medicine as told by your doctor.  Finish your medicine even if you start to feel better.  Tell your sex partner that you have an infection. They should see their doctor for treatment.  During treatment:  Avoid sex or use condoms correctly.  Do not douche.  Do not drink alcohol unless your doctor tells you it is ok.  Do not breastfeed unless your doctor tells you it is ok. GET HELP IF:  You are not getting better after 3 days of treatment.  You have more grey fluid (discharge) coming from your vagina than before.  You have more pain than before.  You have a fever. MAKE SURE YOU:   Understand these instructions.  Will watch your condition.  Will get help right away if you are not doing well or get worse.   This information is not intended to replace advice given to you by your health care provider. Make sure you discuss any questions you have with your health care provider.   Document Released: 09/06/2008 Document Revised: 12/19/2014 Document Reviewed: 07/10/2013 Elsevier Interactive Patient Education 2016 Elsevier Inc.  Genital Herpes Genital herpes is a common sexually transmitted infection (STI) that is caused by a virus. The virus is spread from person to person through sexual contact. Infection can cause itching, blisters, and sores in the genital area or rectal area. This is called an outbreak. It affects both men and women. Genital herpes is particularly concerning for pregnant women because the virus can be passed to the baby during delivery and cause serious problems. Genital herpes is also a concern for people  with a weakened defense (immune) system. Symptoms of genital herpes may last several days and then go away. However, the virus remains in your body, so you may have more outbreaks of symptoms in the future. The time between outbreaks varies and can be months or years. CAUSES Genital herpes is caused by a virus called herpes simplex virus (HSV) type 2 or HSV type 1. These viruses are contagious and are most often spread through sexual contact with an infected person. Sexual contact includes vaginal, anal, and oral sex. RISK FACTORS Risk factors for genital herpes include:  Being sexually active with multiple partners.  Having unprotected sex. SIGNS AND SYMPTOMS Symptoms may include:  Pain and itching in the genital area or rectal area.  Small red bumps that turn into blisters and then turn into sores.  Flu-like symptoms, including:  Fever.  Body aches.  Painful urination.  Vaginal discharge. DIAGNOSIS Genital herpes may be diagnosed by:  Physical exam.  Blood test.  Fluid culture test from an open sore. TREATMENT There is no cure for genital herpes. Oral antiviral medicines may be used to speed up healing and to help prevent the return of symptoms. These medicines can also help to reduce the spread of the virus to sexual partners. HOME CARE INSTRUCTIONS  Keep the affected areas dry and clean.  Take medicines only as directed by your health care provider.  Do not have sexual contact during active infections. Genital herpes is contagious.  Practice safe sex. Latex condoms and female condoms may help to prevent  the spread of the herpes virus.  Avoid rubbing or touching the blisters and sores. If you do touch the blister or sores:  Wash your hands thoroughly.  Do not touch your eyes afterward.  If you become pregnant, tell your health care provider if you have had genital herpes.  Keep all follow-up visits as directed by your health care provider. This is  important. PREVENTION  Use condoms. Although anyone can contract genital herpes during sexual contact even with the use of a condom, a condom can provide some protection.  Avoid having multiple sexual partners.  Talk to your sexual partner about any symptoms and past history that either of you may have.  Get tested before you have sex. Ask your partner to do the same.  Recognize the symptoms of genital herpes. Do not have sexual contact if you notice these symptoms. SEEK MEDICAL CARE IF:  Your symptoms are not improving with medicine.  Your symptoms return.  You have new symptoms.  You have a fever.  You have abdominal pain.  You have redness, swelling, or pain in your eye. MAKE SURE YOU:  Understand these instructions.  Will watch your condition.  Will get help right away if you are not doing well or get worse.   This information is not intended to replace advice given to you by your health care provider. Make sure you discuss any questions you have with your health care provider.   Document Released: 11/25/2000 Document Revised: 12/19/2014 Document Reviewed: 04/15/2014 Elsevier Interactive Patient Education [employer]2016 Else[employer] Inc.

## 2017-02-13 ENCOUNTER — Encounter (HOSPITAL_COMMUNITY): Payer: Self-pay

## 2017-02-13 ENCOUNTER — Emergency Department (HOSPITAL_COMMUNITY)
Admission: EM | Admit: 2017-02-13 | Discharge: 2017-02-14 | Disposition: A | Discharge status: Home / Self Care | Payer: Medicaid Other | Attending: Emergency Medicine | Admitting: Emergency Medicine

## 2017-02-13 DIAGNOSIS — Y929 Unspecified place or not applicable: Secondary | ICD-10-CM | POA: Diagnosis not present

## 2017-02-13 DIAGNOSIS — Y999 Unspecified external cause status: Secondary | ICD-10-CM | POA: Diagnosis not present

## 2017-02-13 DIAGNOSIS — X58XXXA Exposure to other specified factors, initial encounter: Secondary | ICD-10-CM | POA: Insufficient documentation

## 2017-02-13 DIAGNOSIS — S00552A Superficial foreign body of oral cavity, initial encounter: Secondary | ICD-10-CM

## 2017-02-13 DIAGNOSIS — T180XXA Foreign body in mouth, initial encounter: Secondary | ICD-10-CM | POA: Diagnosis present

## 2017-02-13 DIAGNOSIS — F17210 Nicotine dependence, cigarettes, uncomplicated: Secondary | ICD-10-CM | POA: Insufficient documentation

## 2017-02-13 DIAGNOSIS — Y939 Activity, unspecified: Secondary | ICD-10-CM | POA: Diagnosis not present

## 2017-02-13 NOTE — ED Triage Notes (Signed)
Pt complaining of tongue ring stuck. Pt with bar across tip of tongue. Pt states one end fell off, unable to screw off end-cap. Pt states irritation to tongue. No bleeding or redness noted at triage.

## 2017-02-14 MED ORDER — BENZOCAINE 10 % MT GEL
Freq: Once | OROMUCOSAL | Status: AC
  Administered 2017-02-14: 1 via OROMUCOSAL
  Filled 2017-02-14: qty 9.4
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
## 2017-02-14 NOTE — ED Notes (Signed)
Orajel applied to tongue

## 2017-02-14 NOTE — ED Notes (Signed)
Pharmacy called regarding medication- lolicaine

## 2017-02-14 NOTE — Discharge Instructions (Signed)
Return for swelling of your tongue, fever, or discharge.

## 2017-02-14 NOTE — ED Notes (Signed)
PA at bedside.

## 2018-01-26 ENCOUNTER — Emergency Department (HOSPITAL_COMMUNITY)
Admission: EM | Admit: 2018-01-26 | Discharge: 2018-01-26 | Disposition: A | Discharge status: Home / Self Care | Payer: Medicaid Other | Attending: Emergency Medicine | Admitting: Emergency Medicine

## 2018-01-26 ENCOUNTER — Encounter (HOSPITAL_COMMUNITY): Payer: Self-pay | Admitting: Emergency Medicine

## 2018-01-26 ENCOUNTER — Emergency Department (HOSPITAL_COMMUNITY): Payer: Medicaid Other

## 2018-01-26 ENCOUNTER — Other Ambulatory Visit: Payer: Self-pay

## 2018-01-26 DIAGNOSIS — R42 Dizziness and giddiness: Secondary | ICD-10-CM | POA: Insufficient documentation

## 2018-01-26 DIAGNOSIS — R1030 Lower abdominal pain, unspecified: Secondary | ICD-10-CM | POA: Insufficient documentation

## 2018-01-26 DIAGNOSIS — R1013 Epigastric pain: Secondary | ICD-10-CM | POA: Diagnosis present

## 2018-01-26 DIAGNOSIS — R110 Nausea: Secondary | ICD-10-CM | POA: Insufficient documentation

## 2018-01-26 DIAGNOSIS — F17210 Nicotine dependence, cigarettes, uncomplicated: Secondary | ICD-10-CM | POA: Diagnosis not present

## 2018-01-26 LAB — CBC WITH DIFFERENTIAL/PLATELET
BASOS PCT: 0 %
Basophils Absolute: 0 10*3/uL (ref 0.0–0.1)
Eosinophils Absolute: 0.1 10*3/uL (ref 0.0–0.7)
Eosinophils Relative: 2 %
HCT: 32.8 % — ABNORMAL LOW (ref 36.0–46.0)
Hemoglobin: 10.6 g/dL — ABNORMAL LOW (ref 12.0–15.0)
LYMPHS ABS: 0.9 10*3/uL (ref 0.7–4.0)
Lymphocytes Relative: 15 %
MCH: 29 pg (ref 26.0–34.0)
MCHC: 32.3 g/dL (ref 30.0–36.0)
MCV: 89.6 fL (ref 78.0–100.0)
MONOS PCT: 3 %
Monocytes Absolute: 0.2 10*3/uL (ref 0.1–1.0)
NEUTROS ABS: 4.9 10*3/uL (ref 1.7–7.7)
Neutrophils Relative %: 80 %
Platelets: 290 10*3/uL (ref 150–400)
RBC: 3.66 MIL/uL — ABNORMAL LOW (ref 3.87–5.11)
RDW: 12.5 % (ref 11.5–15.5)
WBC: 6.2 10*3/uL (ref 4.0–10.5)

## 2018-01-26 LAB — URINALYSIS, ROUTINE W REFLEX MICROSCOPIC
GLUCOSE, UA: NEGATIVE mg/dL
HGB URINE DIPSTICK: NEGATIVE
Ketones, ur: 5 mg/dL — AB
Leukocytes, UA: NEGATIVE
Nitrite: NEGATIVE
PH: 6 (ref 5.0–8.0)
Protein, ur: NEGATIVE mg/dL
SPECIFIC GRAVITY, URINE: 1.031 — AB (ref 1.005–1.030)

## 2018-01-26 LAB — COMPREHENSIVE METABOLIC PANEL
ALBUMIN: 3.7 g/dL (ref 3.5–5.0)
ALK PHOS: 47 U/L (ref 38–126)
ALT: 94 U/L — ABNORMAL HIGH (ref 14–54)
ANION GAP: 12 (ref 5–15)
AST: 174 U/L — ABNORMAL HIGH (ref 15–41)
BUN: 5 mg/dL — AB (ref 6–20)
CALCIUM: 9.3 mg/dL (ref 8.9–10.3)
CO2: 21 mmol/L — ABNORMAL LOW (ref 22–32)
Chloride: 107 mmol/L (ref 101–111)
Creatinine, Ser: 0.89 mg/dL (ref 0.44–1.00)
GFR calc Af Amer: >60 mL/min (ref >60)
GFR calc non Af Amer: >60 mL/min (ref >60)
GLUCOSE: 99 mg/dL (ref 65–99)
Potassium: 3.6 mmol/L (ref 3.5–5.1)
Sodium: 140 mmol/L (ref 135–145)
TOTAL PROTEIN: 5.7 g/dL — AB (ref 6.5–8.1)
Total Bilirubin: 0.4 mg/dL (ref 0.3–1.2)

## 2018-01-26 LAB — I-STAT BETA HCG BLOOD, ED (MC, WL, AP ONLY): I-stat hCG, quantitative: <5 m[IU]/mL (ref <5)

## 2018-01-26 LAB — LIPASE, BLOOD: Lipase: 34 U/L (ref 11–51)

## 2018-01-26 MED ORDER — GI COCKTAIL ~~LOC~~
30.0000 mL | Freq: Once | ORAL | Status: AC
  Administered 2018-01-26: 30 mL via ORAL
  Filled 2018-01-26: qty 30

## 2018-01-26 MED ORDER — MORPHINE SULFATE (PF) 4 MG/ML IV SOLN
4.0000 mg | Freq: Once | INTRAVENOUS | Status: DC
  Filled 2018-01-26: qty 1

## 2018-01-26 MED ORDER — OMEPRAZOLE 20 MG PO CPDR: 20.0000 mg | DELAYED_RELEASE_CAPSULE | Freq: Every day | ORAL | 0 refills | Status: DC

## 2018-01-26 MED ORDER — ONDANSETRON HCL 4 MG/2ML IJ SOLN
4.0000 mg | Freq: Once | INTRAMUSCULAR | Status: DC
  Filled 2018-01-26: qty 2

## 2018-01-26 MED ORDER — SUCRALFATE 1 G PO TABS: 1.0000 g | ORAL_TABLET | Freq: Three times a day (TID) | ORAL | 0 refills | Status: DC

## 2018-01-26 MED ORDER — FAMOTIDINE 20 MG PO TABS: 20.0000 mg | ORAL_TABLET | Freq: Two times a day (BID) | ORAL | 0 refills | Status: DC
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
## 2018-01-26 NOTE — ED Notes (Signed)
Patient transported to Ultrasound 

## 2018-01-26 NOTE — ED Notes (Signed)
Patient aware that urine sample is needed.

## 2018-01-26 NOTE — Discharge Instructions (Signed)
Please read and follow all provided instructions.  Your diagnoses today include:  1. Epigastric pain     Tests performed today include:  Blood counts and electrolytes  Blood tests to check liver and kidney function - slightly high liver enzymes  Blood tests to check pancreas function  Urine test to look for infection and pregnancy (in women)  X-ray -shows some constipation  Ultrasound -normal gallbladder  Vital signs. See below for your results today.   Medications prescribed:   Omeprazole (Prilosec) - stomach acid reducer  This medication can be found over-the-counter   Pepcid (famotidine) - antihistamine  You can find this medication over-the-counter.   DO NOT exceed:   20mg  Pepcid every 12 hours   Carafate - for stomach upset and to protect your stomach  Take any prescribed medications only as directed.  Home care instructions:   Follow any educational materials contained in this packet.  Avoid Tylenol and NSAIDs such as ibuprofen, aspirin, or Aleve.  Follow-up instructions: Please follow-up with your primary care provider in the next 3 days for further evaluation of your symptoms.    Return instructions:  SEEK IMMEDIATE MEDICAL ATTENTION IF:  The pain does not go away or becomes severe   A temperature above 101F develops   Repeated vomiting occurs (multiple episodes)   The pain becomes localized to portions of the abdomen. The right side could possibly be appendicitis. In an adult, the left lower portion of the abdomen could be colitis or diverticulitis.   Blood is being passed in stools or vomit (bright red or black tarry stools)   You develop chest pain, difficulty breathing, dizziness or fainting, or become confused, poorly responsive, or inconsolable (young children)  If you have any other emergent concerns regarding your health  Additional Information: Abdominal (belly) pain can be caused by many things. Your caregiver performed an  examination and possibly ordered blood/urine tests and imaging (CT scan, x-rays, ultrasound). Many cases can be observed and treated at home after initial evaluation in the emergency department. Even though you are being discharged home, abdominal pain can be unpredictable. Therefore, you need a repeated exam if your pain does not resolve, returns, or worsens. Most patients with abdominal pain don't have to be admitted to the hospital or have surgery, but serious problems like appendicitis and gallbladder attacks can start out as nonspecific pain. Many abdominal conditions cannot be diagnosed in one visit, so follow-up evaluations are very important.  Your vital signs today were: BP 107/63    Pulse 79    Temp 98.3 F (36.8 C) (Oral)    Resp 15    Ht 5\' 6"  (1.676 m)    Wt 61.2 kg (135 lb)    LMP 01/24/2018    SpO2 98%    BMI 21.79 kg/m  If your blood pressure (bp) was elevated above 135/85 this visit, please have this repeated by your doctor within one month. --------------

## 2018-01-26 NOTE — ED Notes (Signed)
Patient sleeping calmly. Will hold pain medicine at this time

## 2018-01-26 NOTE — ED Triage Notes (Signed)
Patient complaining of sharp left sided chest pain that began this morning. Patient reports radiation into back. Patient reports nausea, SOB, and dizziness. 10/10 on the pain scale. Patient reports taking "some" tylenol with no relief.

## 2018-12-12 NOTE — L&D Delivery Note (Signed)
Delivery Note Patient complete and pushed x 1 contraction to delivery baby A over intact perineum, vertex. Cord clamped x 2 and taken to waiting peds. Cord blood obtained  Tonye, Calligan [[id number]]  At 11:15 PM a viable female was delivered via Vaginal, Spontaneous (Presentation: vertex direct OP).  APGAR, weight pending  Baby B noted to be transverse. Good FHR noted on bedside u/s and finally retrieved 1 foot and brought down and with pushing able to perform a breech extraction of Baby B with terminal meconium.   Ibtihaj, Gasco [place] [[id number]]  At 11:28 PM a viable female was delivered via Vaginal, Spontaneous (Presentation: transverse-->single footling breech).  APGAR:, weight pending. Cord clamped x 2, cut. Cord blood obtained and taken to waiting peds.Cord blood obtained. Cord clamp attached to B cord for pathology Placenta status: intact x 2.  Anesthesia:  Epidural Episiotomy:  none Lacerations:  none Est. Blood Loss (mL):  150cc  Mom to postpartum.   Baby A to NICU.   Baby B to NICU.  Reva Bores 05/12/2019, 11:46 PM

## 2019-01-25 ENCOUNTER — Inpatient Hospital Stay (HOSPITAL_COMMUNITY)
Admission: AD | Admit: 2019-01-25 | Discharge: 2019-01-25 | Disposition: A | Discharge status: Home / Self Care | Payer: Medicaid Other | Attending: Obstetrics & Gynecology | Admitting: Obstetrics & Gynecology

## 2019-01-25 ENCOUNTER — Encounter (HOSPITAL_COMMUNITY): Payer: Self-pay | Admitting: *Deleted

## 2019-01-25 DIAGNOSIS — O0932 Supervision of pregnancy with insufficient antenatal care, second trimester: Secondary | ICD-10-CM

## 2019-01-25 DIAGNOSIS — Z3A23 23 weeks gestation of pregnancy: Secondary | ICD-10-CM | POA: Diagnosis not present

## 2019-01-25 DIAGNOSIS — O26892 Other specified pregnancy related conditions, second trimester: Secondary | ICD-10-CM | POA: Diagnosis not present

## 2019-01-25 DIAGNOSIS — R109 Unspecified abdominal pain: Secondary | ICD-10-CM | POA: Insufficient documentation

## 2019-01-25 LAB — URINALYSIS, ROUTINE W REFLEX MICROSCOPIC
BILIRUBIN URINE: NEGATIVE
Glucose, UA: NEGATIVE mg/dL
Hgb urine dipstick: NEGATIVE
KETONES UR: NEGATIVE mg/dL
NITRITE: NEGATIVE
PH: 7.5 (ref 5.0–8.0)
PROTEIN: NEGATIVE mg/dL
Specific Gravity, Urine: 1.015 (ref 1.005–1.030)

## 2019-01-25 LAB — URINALYSIS, MICROSCOPIC (REFLEX)

## 2019-01-25 LAB — RAPID URINE DRUG SCREEN, HOSP PERFORMED
Amphetamines: NOT DETECTED
BARBITURATES: NOT DETECTED
BENZODIAZEPINES: NOT DETECTED
Cocaine: NOT DETECTED
OPIATES: NOT DETECTED
Tetrahydrocannabinol: NOT DETECTED

## 2019-01-25 NOTE — MAU Note (Signed)
Urine sent to lab 
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
## 2019-01-25 NOTE — MAU Note (Signed)
Patient is a G4P2 at an unknown gestation with twins.  Patient states that she is here because "she wants to see what is up" with her pregnancy.  She denies any bleeding, pain, or other physical concerns.  Pt asks "Can I get an ultrasound to see the gender?"  Pt told that she would need to make an office appointment if she only wanted an ultrasound for gender, but if she had any other concerns or medical needs we could help her.  Patient stated she wanted to go to the office and began getting dressed.

## 2019-01-25 NOTE — MAU Provider Note (Signed)
  Christy West [id number]  S: Patient presents with request for ultrasound.  Patient states she checked in c/o abdominal pain, but does not have any.  She states she has been in [place] and has only received one prenatal visit where she received her dates.  She endorses good fetal movement x 2 and denies vaginal concerns including bleeding, itching, irritation, or burning.  She endorses discharge, but states this has been throughout the pregnancy and denies odor or abnormal color.  Patient also denies LOF.   O:  Vitals:   01/25/19 1435  BP: 116/69  Pulse: 96  Resp: 18  Temp: 98.1 F (36.7 C)  TempSrc: Oral  Weight: 71.2 kg  Height: 5\' 6"  (1.676 m)   Dopplers Twin A; 140 Twin B; 145  A: TIUP at 23.5 weeks 2nd Trimester with No PNC   P:  -Informed that [place] will not take place today.  Patient verbalizes understanding. -Discussed initiation of PNC at any community ob provider. -Provider list given -Patient w/o questions or concerns. -Encouraged to return if any issues arise. -Discharged to home in stable condition.   Cherre Robins MSN, CNM 01/25/2019 3:46 PM

## 2019-01-25 NOTE — Discharge Instructions (Signed)

## 2019-01-30 ENCOUNTER — Other Ambulatory Visit (HOSPITAL_COMMUNITY)
Admission: RE | Admit: 2019-01-30 | Discharge: 2019-01-30 | Disposition: A | Discharge status: Home / Self Care | Payer: Medicaid Other | Source: Ambulatory Visit | Attending: Obstetrics and Gynecology | Admitting: Obstetrics and Gynecology

## 2019-01-30 ENCOUNTER — Ambulatory Visit: Payer: Self-pay | Admitting: Clinical

## 2019-01-30 ENCOUNTER — Encounter: Payer: Self-pay | Admitting: Obstetrics and Gynecology

## 2019-01-30 ENCOUNTER — Ambulatory Visit (INDEPENDENT_AMBULATORY_CARE_PROVIDER_SITE_OTHER): Payer: Medicaid Other | Admitting: Obstetrics and Gynecology

## 2019-01-30 VITALS — BP 110/68 | HR 89 | Wt 160.1 lb

## 2019-01-30 DIAGNOSIS — Z3A20 20 weeks gestation of pregnancy: Secondary | ICD-10-CM

## 2019-01-30 DIAGNOSIS — B009 Herpesviral infection, unspecified: Secondary | ICD-10-CM

## 2019-01-30 DIAGNOSIS — O30002 Twin pregnancy, unspecified number of placenta and unspecified number of amniotic sacs, second trimester: Secondary | ICD-10-CM | POA: Diagnosis not present

## 2019-01-30 DIAGNOSIS — O09899 Supervision of other high risk pregnancies, unspecified trimester: Secondary | ICD-10-CM

## 2019-01-30 DIAGNOSIS — O0990 Supervision of high risk pregnancy, unspecified, unspecified trimester: Secondary | ICD-10-CM

## 2019-01-30 DIAGNOSIS — O99013 Anemia complicating pregnancy, third trimester: Secondary | ICD-10-CM

## 2019-01-30 DIAGNOSIS — O30049 Twin pregnancy, dichorionic/diamniotic, unspecified trimester: Secondary | ICD-10-CM | POA: Insufficient documentation

## 2019-01-30 DIAGNOSIS — O09212 Supervision of pregnancy with history of pre-term labor, second trimester: Secondary | ICD-10-CM | POA: Diagnosis not present

## 2019-01-30 DIAGNOSIS — O0992 Supervision of high risk pregnancy, unspecified, second trimester: Secondary | ICD-10-CM | POA: Diagnosis present

## 2019-01-30 DIAGNOSIS — O30009 Twin pregnancy, unspecified number of placenta and unspecified number of amniotic sacs, unspecified trimester: Secondary | ICD-10-CM

## 2019-01-30 DIAGNOSIS — Z23 Encounter for immunization: Secondary | ICD-10-CM

## 2019-01-30 DIAGNOSIS — O09219 Supervision of pregnancy with history of pre-term labor, unspecified trimester: Principal | ICD-10-CM

## 2019-01-30 MED ORDER — PROGESTERONE MICRONIZED 200 MG PO CAPS: ORAL_CAPSULE | ORAL | 3 refills | Status: DC

## 2019-01-30 NOTE — Patient Instructions (Signed)
 Second Trimester of Pregnancy The second trimester is from week 14 through week 27 (months 4 through 6). The second trimester is often a time when you feel your best. Your body has adjusted to being pregnant, and you begin to feel better physically. Usually, morning sickness has lessened or quit completely, you may have more energy, and you may have an increase in appetite. The second trimester is also a time when the fetus is growing rapidly. At the end of the sixth month, the fetus is about 9 inches long and weighs about 1 pounds. You will likely begin to feel the baby move (quickening) between 16 and 20 weeks of pregnancy. Body changes during your second trimester Your body continues to go through many changes during your second trimester. The changes vary from woman to woman.  Your weight will continue to increase. You will notice your lower abdomen bulging out.  You may begin to get stretch marks on your hips, abdomen, and breasts.  You may develop headaches that can be relieved by medicines. The medicines should be approved by your health care provider.  You may urinate more often because the fetus is pressing on your bladder.  You may develop or continue to have heartburn as a result of your pregnancy.  You may develop constipation because certain hormones are causing the muscles that push waste through your intestines to slow down.  You may develop hemorrhoids or swollen, bulging veins (varicose veins).  You may have back pain. This is caused by: ? Weight gain. ? Pregnancy hormones that are relaxing the joints in your pelvis. ? A shift in weight and the muscles that support your balance.  Your breasts will continue to grow and they will continue to become tender.  Your gums may bleed and may be sensitive to brushing and flossing.  Dark spots or blotches (chloasma, mask of pregnancy) may develop on your face. This will likely fade after the baby is born.  A dark line from  your belly button to the pubic area (linea nigra) may appear. This will likely fade after the baby is born.  You may have changes in your hair. These can include thickening of your hair, rapid growth, and changes in texture. Some women also have hair loss during or after pregnancy, or hair that feels dry or thin. Your hair will most likely return to normal after your baby is born. What to expect at prenatal visits During a routine prenatal visit:  You will be weighed to make sure you and the fetus are growing normally.  Your blood pressure will be taken.  Your abdomen will be measured to track your baby's growth.  The fetal heartbeat will be listened to.  Any test results from the previous visit will be discussed. Your health care provider may ask you:  How you are feeling.  If you are feeling the baby move.  If you have had any abnormal symptoms, such as leaking fluid, bleeding, severe headaches, or abdominal cramping.  If you are using any tobacco products, including cigarettes, chewing tobacco, and electronic cigarettes.  If you have any questions. Other tests that may be performed during your second trimester include:  Blood tests that check for: ? Low iron levels (anemia). ? High blood sugar that affects pregnant women (gestational diabetes) between 24 and 28 weeks. ? Rh antibodies. This is to check for a protein on red blood cells (Rh factor).  Urine tests to check for infections, diabetes, or protein in   the urine.  An ultrasound to confirm the proper growth and development of the baby.  An amniocentesis to check for possible genetic problems.  Fetal screens for spina bifida and Down syndrome.  HIV (human immunodeficiency virus) testing. Routine prenatal testing includes screening for HIV, unless you choose not to have this test. Follow these instructions at home: Medicines  Follow your health care provider's instructions regarding medicine use. Specific medicines  may be either safe or unsafe to take during pregnancy.  Take a prenatal vitamin that contains at least 600 micrograms (mcg) of folic acid.  If you develop constipation, try taking a stool softener if your health care provider approves. Eating and drinking   Eat a balanced diet that includes fresh fruits and vegetables, whole grains, good sources of protein such as meat, eggs, or tofu, and low-fat dairy. Your health care provider will help you determine the amount of weight gain that is right for you.  Avoid raw meat and uncooked cheese. These carry germs that can cause birth defects in the baby.  If you have low calcium intake from food, talk to your health care provider about whether you should take a daily calcium supplement.  Limit foods that are high in fat and processed sugars, such as fried and sweet foods.  To prevent constipation: ? Drink enough fluid to keep your urine clear or pale yellow. ? Eat foods that are high in fiber, such as fresh fruits and vegetables, whole grains, and beans. Activity  Exercise only as directed by your health care provider. Most women can continue their usual exercise routine during pregnancy. Try to exercise for 30 minutes at least 5 days a week. Stop exercising if you experience uterine contractions.  Avoid heavy lifting, wear low heel shoes, and practice good posture.  A sexual relationship may be continued unless your health care provider directs you otherwise. Relieving pain and discomfort  Wear a good support bra to prevent discomfort from breast tenderness.  Take warm sitz baths to soothe any pain or discomfort caused by hemorrhoids. Use hemorrhoid cream if your health care provider approves.  Rest with your legs elevated if you have leg cramps or low back pain.  If you develop varicose veins, wear support hose. Elevate your feet for 15 minutes, 3-4 times a day. Limit salt in your diet. Prenatal Care  Write down your questions. Take  them to your prenatal visits.  Keep all your prenatal visits as told by your health care provider. This is important. Safety  Wear your seat belt at all times when driving.  Make a list of emergency phone numbers, including numbers for family, friends, the hospital, and police and fire departments. General instructions  Ask your health care provider for a referral to a local prenatal education class. Begin classes no later than the beginning of month 6 of your pregnancy.  Ask for help if you have counseling or nutritional needs during pregnancy. Your health care provider can offer advice or refer you to specialists for help with various needs.  Do not use hot tubs, steam rooms, or saunas.  Do not douche or use tampons or scented sanitary pads.  Do not cross your legs for long periods of time.  Avoid cat litter boxes and soil used by cats. These carry germs that can cause birth defects in the baby and possibly loss of the fetus by miscarriage or stillbirth.  Avoid all smoking, herbs, alcohol, and unprescribed drugs. Chemicals in these products can affect the   formation and growth of the baby.  Do not use any products that contain nicotine or tobacco, such as cigarettes and e-cigarettes. If you need help quitting, ask your health care provider.  Visit your dentist if you have not gone yet during your pregnancy. Use a soft toothbrush to brush your teeth and be gentle when you floss. Contact a health care provider if:  You have dizziness.  You have mild pelvic cramps, pelvic pressure, or nagging pain in the abdominal area.  You have persistent nausea, vomiting, or diarrhea.  You have a bad smelling vaginal discharge.  You have pain when you urinate. Get help right away if:  You have a fever.  You are leaking fluid from your vagina.  You have spotting or bleeding from your vagina.  You have severe abdominal cramping or pain.  You have rapid weight gain or weight loss.  You  have shortness of breath with chest pain.  You notice sudden or extreme swelling of your face, hands, ankles, feet, or legs.  You have not felt your baby move in over an hour.  You have severe headaches that do not go away when you take medicine.  You have vision changes. Summary  The second trimester is from week 14 through week 27 (months 4 through 6). It is also a time when the fetus is growing rapidly.  Your body goes through many changes during pregnancy. The changes vary from woman to woman.  Avoid all smoking, herbs, alcohol, and unprescribed drugs. These chemicals affect the formation and growth your baby.  Do not use any tobacco products, such as cigarettes, chewing tobacco, and e-cigarettes. If you need help quitting, ask your health care provider.  Contact your health care provider if you have any questions. Keep all prenatal visits as told by your health care provider. This is important. This information is not intended to replace advice given to you by your health care provider. Make sure you discuss any questions you have with your health care provider. Document Released: 11/22/2001 Document Revised: 01/03/2017 Document Reviewed: 01/03/2017 Elsevier Interactive Patient Education  2019 [employer].   Multiple Pregnancy Having a multiple pregnancy means that a woman is carrying more than one baby at a time. She may be pregnant with twins, triplets, or more. The majority of multiple pregnancies are twins. Naturally conceiving triplets or more (higher-order multiples) is rare. Multiple pregnancies are riskier than single pregnancies. A woman with a multiple pregnancy is more likely to have certain problems during her pregnancy. Therefore, she will need to have more frequent appointments for prenatal care. How does a multiple pregnancy happen? A multiple pregnancy happens when:  The woman's body releases more than one egg at a time, and then each egg gets fertilized by a  different sperm. ? This is the most common type of multiple pregnancy. ? Twins or other multiples produced this way are fraternal. They are no more alike than non-multiple siblings are.  One sperm fertilizes one egg, which then divides into more than one embryo. ? Twins or other multiples produced this way are identical. Identical multiples are always the same gender, and they look very much alike. Who is most likely to have a multiple pregnancy? A multiple pregnancy is more likely to develop in women who:  Have had fertility treatment, especially if the treatment included fertility drugs.  Are older than 32 years of age.  Have already had four or more children.  Have a family history of multiple pregnancy.  How is a multiple pregnancy diagnosed? A multiple pregnancy may be diagnosed based on:  Symptoms such as: ? Rapid weight gain in the first 3 months of pregnancy (first trimester). ? More severe nausea and breast tenderness than what is typical of a single pregnancy. ? The uterus measuring larger than what is normal for the stage of the pregnancy.  Blood tests that detect a higher-than-normal level of human chorionic gonadotropin (hCG). This is a hormone that your body produces in early pregnancy.  Ultrasound exam. This is used to confirm that you are carrying multiples. What risks are associated with multiple pregnancy? A multiple pregnancy puts you at a higher risk for certain problems during or after your pregnancy, including:  Having your babies delivered before you have reached a full-term pregnancy (preterm birth). A full-term pregnancy lasts for at least 37 weeks. Babies born before 37 weeks may have a higher risk of a variety of health problems, such as breathing problems, feeding difficulties, cerebral palsy, and learning disabilities.  Diabetes.  Preeclampsia. This is a serious condition that causes high blood pressure along with other symptoms, such as swelling and  headaches, during pregnancy.  Excessive blood loss after childbirth (postpartum hemorrhage).  Postpartum depression.  Low birth weight of the babies. How will having a multiple pregnancy affect my care? Your health care provider will want to monitor you more closely during your pregnancy to make sure that your babies are growing normally and that you are healthy. Follow these instructions at home: Because your pregnancy is considered to be high risk, you will need to work closely with your health care team. You may also need to make some lifestyle changes. These may include the following: Eating and drinking  Increase your nutrition. ? Follow your health care provider's recommendations for weight gain. You may need to gain a little extra weight when you are pregnant with multiples. ? Eat healthy snacks often throughout the day. This can add calories and reduce nausea.  Drink enough fluid to keep your urine pale yellow.  Take prenatal vitamins. Activity By 20-24 weeks, you may need to limit your activities.  Avoid activities and work that take a lot of effort (are strenuous).  Ask your health care provider when you should stop having sexual intercourse.  Rest often. General instructions  Do not use any products that contain nicotine or tobacco, such as cigarettes and e-cigarettes. If you need help quitting, ask your health care provider.  Do not drink alcohol or use illegal drugs.  Take over-the-counter and prescription medicines only as told by your health care provider.  Arrange for extra help around the house.  Keep all follow-up visits and all prenatal visits as told by your health care provider. This is important. Contact a health care provider if:  You have dizziness.  You have persistent nausea, vomiting, or diarrhea.  You are having trouble gaining weight.  You have feelings of depression or other emotions that are interfering with your normal activities. Get  help right away if:  You have a fever.  You have pain with urination.  You have fluid leaking from your vagina.  You have a bad-smelling vaginal discharge.  You notice increased swelling in your face, hands, legs, or ankles.  You have spotting or bleeding from your vagina.  You have pelvic cramps, pelvic pressure, or nagging pain in your abdomen or lower back.  You are having regular contractions.  You develop a severe headache, with or without visual changes.  You have shortness of breath or chest pain.  You notice less fetal movement, or no fetal movement. Summary  Having a multiple pregnancy means that a woman is carrying more than one baby at a time.  A multiple pregnancy puts you at a higher risk for certain problems during and after your pregnancy, such as: having your babies delivered before you have reached a full-term pregnancy (preterm birth), diabetes, preeclampsia, excessive blood loss after childbirth (postpartum hemorrhage), postpartum depression, or low birth weight of the babies.  Your health care provider will want to monitor you more closely during your pregnancy to make sure that your babies are growing normally and that you are healthy.  You may need to make some lifestyle changes during pregnancy, including: increasing your nutrition, limiting your activities after 20-24 weeks of pregnancy, and arranging for extra help around the house. This information is not intended to replace advice given to you by your health care provider. Make sure you discuss any questions you have with your health care provider. Document Released: 09/06/2008 Document Revised: 08/23/2017 Document Reviewed: 07/29/2016 Elsevier Interactive Patient Education  2019 [employer].   Contraception Choices Contraception, also called birth control, refers to methods or devices that prevent pregnancy. Hormonal methods Contraceptive implant  A contraceptive implant is a thin, plastic tube  that contains a hormone. It is inserted into the upper part of the arm. It can remain in place for up to 3 years. Progestin-only injections Progestin-only injections are injections of progestin, a synthetic form of the hormone progesterone. They are given every 3 months by a health care provider. Birth control pills  Birth control pills are pills that contain hormones that prevent pregnancy. They must be taken once a day, preferably at the same time each day. Birth control patch  The birth control patch contains hormones that prevent pregnancy. It is placed on the skin and must be changed once a week for three weeks and removed on the fourth week. A prescription is needed to use this method of contraception. Vaginal ring  A vaginal ring contains hormones that prevent pregnancy. It is placed in the vagina for three weeks and removed on the fourth week. After that, the process is repeated with a new ring. A prescription is needed to use this method of contraception. Emergency contraceptive Emergency contraceptives prevent pregnancy after unprotected sex. They come in pill form and can be taken up to 5 days after sex. They work best the sooner they are taken after having sex. Most emergency contraceptives are available without a prescription. This method should not be used as your only form of birth control. Barrier methods Female condom  A female condom is a thin sheath that is worn over the penis during sex. Condoms keep sperm from going inside a woman's body. They can be used with a spermicide to increase their effectiveness. They should be disposed after a single use. Female condom  A female condom is a soft, loose-fitting sheath that is put into the vagina before sex. The condom keeps sperm from going inside a woman's body. They should be disposed after a single use. Diaphragm  A diaphragm is a soft, dome-shaped barrier. It is inserted into the vagina before sex, along with a spermicide. The  diaphragm blocks sperm from entering the uterus, and the spermicide kills sperm. A diaphragm should be left in the vagina for 6-8 hours after sex and removed within 24 hours. A diaphragm is prescribed and fitted by a health care provider.  A diaphragm should be replaced every 1-2 years, after giving birth, after gaining more than 15 lb (6.8 kg), and after pelvic surgery. Cervical cap  A cervical cap is a round, soft latex or plastic cup that fits over the cervix. It is inserted into the vagina before sex, along with spermicide. It blocks sperm from entering the uterus. The cap should be left in place for 6-8 hours after sex and removed within 48 hours. A cervical cap must be prescribed and fitted by a health care provider. It should be replaced every 2 years. Sponge  A sponge is a soft, circular piece of polyurethane foam with spermicide on it. The sponge helps block sperm from entering the uterus, and the spermicide kills sperm. To use it, you make it wet and then insert it into the vagina. It should be inserted before sex, left in for at least 6 hours after sex, and removed and thrown away within 30 hours. Spermicides Spermicides are chemicals that kill or block sperm from entering the cervix and uterus. They can come as a cream, jelly, suppository, foam, or tablet. A spermicide should be inserted into the vagina with an applicator at least 10-15 minutes before sex to allow time for it to work. The process must be repeated every time you have sex. Spermicides do not require a prescription. Intrauterine contraception Intrauterine device (IUD) An IUD is a T-shaped device that is put in a woman's uterus. There are two types:  Hormone IUD.This type contains progestin, a synthetic form of the hormone progesterone. This type can stay in place for 3-5 years.  Copper IUD.This type is wrapped in copper wire. It can stay in place for 10 years.  Permanent methods of contraception Female tubal ligation In  this method, a woman's fallopian tubes are sealed, tied, or blocked during surgery to prevent eggs from traveling to the uterus. Hysteroscopic sterilization In this method, a small, flexible insert is placed into each fallopian tube. The inserts cause scar tissue to form in the fallopian tubes and block them, so sperm cannot reach an egg. The procedure takes about 3 months to be effective. Another form of birth control must be used during those 3 months. Female sterilization This is a procedure to tie off the tubes that carry sperm (vasectomy). After the procedure, the man can still ejaculate fluid (semen). Natural planning methods Natural family planning In this method, a couple does not have sex on days when the woman could become pregnant. Calendar method This means keeping track of the length of each menstrual cycle, identifying the days when pregnancy can happen, and not having sex on those days. Ovulation method In this method, a couple avoids sex during ovulation. Symptothermal method This method involves not having sex during ovulation. The woman typically checks for ovulation by watching changes in her temperature and in the consistency of cervical mucus. Post-ovulation method In this method, a couple waits to have sex until after ovulation. Summary  Contraception, also called birth control, means methods or devices that prevent pregnancy.  Hormonal methods of contraception include implants, injections, pills, patches, vaginal rings, and emergency contraceptives.  Barrier methods of contraception can include female condoms, female condoms, diaphragms, cervical caps, sponges, and spermicides.  There are two types of IUDs (intrauterine devices). An IUD can be put in a woman's uterus to prevent pregnancy for 3-5 years.  Permanent sterilization can be done through a procedure for males, females, or both.  Natural family planning methods involve not having sex  on days when the woman could  become pregnant. This information is not intended to replace advice given to you by your health care provider. Make sure you discuss any questions you have with your health care provider. Document Released: 11/28/2005 Document Revised: 11/30/2017 Document Reviewed: 12/31/2016 Elsevier Interactive Patient Education  2019 [employer].   Breastfeeding  Choosing to breastfeed is one of the best decisions you can make for yourself and your baby. A change in hormones during pregnancy causes your breasts to make breast milk in your milk-producing glands. Hormones prevent breast milk from being released before your baby is born. They also prompt milk flow after birth. Once breastfeeding has begun, thoughts of your baby, as well as his or her sucking or crying, can stimulate the release of milk from your milk-producing glands. Benefits of breastfeeding Research shows that breastfeeding offers many health benefits for infants and mothers. It also offers a cost-free and convenient way to feed your baby. For your baby  Your first milk (colostrum) helps your baby's digestive system to function better.  Special cells in your milk (antibodies) help your baby to fight off infections.  Breastfed babies are less likely to develop asthma, allergies, obesity, or type 2 diabetes. They are also at lower risk for sudden infant death syndrome (SIDS).  Nutrients in breast milk are better able to meet your baby's needs compared to infant formula.  Breast milk improves your baby's brain development. For you  Breastfeeding helps to create a very special bond between you and your baby.  Breastfeeding is convenient. Breast milk costs nothing and is always available at the correct temperature.  Breastfeeding helps to burn calories. It helps you to lose the weight that you gained during pregnancy.  Breastfeeding makes your uterus return faster to its size before pregnancy. It also slows bleeding (lochia) after you  give birth.  Breastfeeding helps to lower your risk of developing type 2 diabetes, osteoporosis, rheumatoid arthritis, cardiovascular disease, and breast, ovarian, uterine, and endometrial cancer later in life. Breastfeeding basics Starting breastfeeding  Find a comfortable place to sit or lie down, with your neck and back well-supported.  Place a pillow or a rolled-up blanket under your baby to bring him or her to the level of your breast (if you are seated). Nursing pillows are specially designed to help support your arms and your baby while you breastfeed.  Make sure that your baby's tummy (abdomen) is facing your abdomen.  Gently massage your breast. With your fingertips, massage from the outer edges of your breast inward toward the nipple. This encourages milk flow. If your milk flows slowly, you may need to continue this action during the feeding.  Support your breast with 4 fingers underneath and your thumb above your nipple (make the letter "C" with your hand). Make sure your fingers are well away from your nipple and your baby's mouth.  Stroke your baby's lips gently with your finger or nipple.  When your baby's mouth is open wide enough, quickly bring your baby to your breast, placing your entire nipple and as much of the areola as possible into your baby's mouth. The areola is the colored area around your nipple. ? More areola should be visible above your baby's upper lip than below the lower lip. ? Your baby's lips should be opened and extended outward (flanged) to ensure an adequate, comfortable latch. ? Your baby's tongue should be between his or her lower gum and your breast.  Make sure that your  baby's mouth is correctly positioned around your nipple (latched). Your baby's lips should create a seal on your breast and be turned out (everted).  It is common for your baby to suck about 2-3 minutes in order to start the flow of breast milk. Latching Teaching your baby how to  latch onto your breast properly is very important. An improper latch can cause nipple pain, decreased milk supply, and poor weight gain in your baby. Also, if your baby is not latched onto your nipple properly, he or she may swallow some air during feeding. This can make your baby fussy. Burping your baby when you switch breasts during the feeding can help to get rid of the air. However, teaching your baby to latch on properly is still the best way to prevent fussiness from swallowing air while breastfeeding. Signs that your baby has successfully latched onto your nipple  Silent tugging or silent sucking, without causing you pain. Infant's lips should be extended outward (flanged).  Swallowing heard between every 3-4 sucks once your milk has started to flow (after your let-down milk reflex occurs).  Muscle movement above and in front of his or her ears while sucking. Signs that your baby has not successfully latched onto your nipple  Sucking sounds or smacking sounds from your baby while breastfeeding.  Nipple pain. If you think your baby has not latched on correctly, slip your finger into the corner of your baby's mouth to break the suction and place it between your baby's gums. Attempt to start breastfeeding again. Signs of successful breastfeeding Signs from your baby  Your baby will gradually decrease the number of sucks or will completely stop sucking.  Your baby will fall asleep.  Your baby's body will relax.  Your baby will retain a small amount of milk in his or her mouth.  Your baby will let go of your breast by himself or herself. Signs from you  Breasts that have increased in firmness, weight, and size 1-3 hours after feeding.  Breasts that are softer immediately after breastfeeding.  Increased milk volume, as well as a change in milk consistency and color by the fifth day of breastfeeding.  Nipples that are not sore, cracked, or bleeding. Signs that your baby is getting  enough milk  Wetting at least 1-2 diapers during the first 24 hours after birth.  Wetting at least 5-6 diapers every 24 hours for the first week after birth. The urine should be clear or pale yellow by the age of 5 days.  Wetting 6-8 diapers every 24 hours as your baby continues to grow and develop.  At least 3 stools in a 24-hour period by the age of 5 days. The stool should be soft and yellow.  At least 3 stools in a 24-hour period by the age of 7 days. The stool should be seedy and yellow.  No loss of weight greater than 10% of birth weight during the first 3 days of life.  Average weight gain of 4-7 oz (113-198 g) per week after the age of 4 days.  Consistent daily weight gain by the age of 5 days, without weight loss after the age of 2 weeks. After a feeding, your baby may spit up a small amount of milk. This is normal. Breastfeeding frequency and duration Frequent feeding will help you make more milk and can prevent sore nipples and extremely full breasts (breast engorgement). Breastfeed when you feel the need to reduce the fullness of your breasts or when  your baby shows signs of hunger. This is called "breastfeeding on demand." Signs that your baby is hungry include:  Increased alertness, activity, or restlessness.  Movement of the head from side to side.  Opening of the mouth when the corner of the mouth or cheek is stroked (rooting).  Increased sucking sounds, smacking lips, cooing, sighing, or squeaking.  Hand-to-mouth movements and sucking on fingers or hands.  Fussing or crying. Avoid introducing a pacifier to your baby in the first 4-6 weeks after your baby is born. After this time, you may choose to use a pacifier. Research has shown that pacifier use during the first year of a baby's life decreases the risk of sudden infant death syndrome (SIDS). Allow your baby to feed on each breast as long as he or she wants. When your baby unlatches or falls asleep while feeding  from the first breast, offer the second breast. Because newborns are often sleepy in the first few weeks of life, you may need to awaken your baby to get him or her to feed. Breastfeeding times will vary from baby to baby. However, the following rules can serve as a guide to help you make sure that your baby is properly fed:  Newborns (babies 45 weeks of age or younger) may breastfeed every 1-3 hours.  Newborns should not go without breastfeeding for longer than 3 hours during the day or 5 hours during the night.  You should breastfeed your baby a minimum of 8 times in a 24-hour period. Breast milk pumping     Pumping and storing breast milk allows you to make sure that your baby is exclusively fed your breast milk, even at times when you are unable to breastfeed. This is especially important if you go back to work while you are still breastfeeding, or if you are not able to be present during feedings. Your lactation consultant can help you find a method of pumping that works best for you and give you guidelines about how long it is safe to store breast milk. Caring for your breasts while you breastfeed Nipples can become dry, cracked, and sore while breastfeeding. The following recommendations can help keep your breasts moisturized and healthy:  Avoid using soap on your nipples.  Wear a supportive bra designed especially for nursing. Avoid wearing underwire-style bras or extremely tight bras (sports bras).  Air-dry your nipples for 3-4 minutes after each feeding.  Use only cotton bra pads to absorb leaked breast milk. Leaking of breast milk between feedings is normal.  Use lanolin on your nipples after breastfeeding. Lanolin helps to maintain your skin's normal moisture barrier. Pure lanolin is not harmful (not toxic) to your baby. You may also hand express a few drops of breast milk and gently massage that milk into your nipples and allow the milk to air-dry. In the first few weeks after  giving birth, some women experience breast engorgement. Engorgement can make your breasts feel heavy, warm, and tender to the touch. Engorgement peaks within 3-5 days after you give birth. The following recommendations can help to ease engorgement:  Completely empty your breasts while breastfeeding or pumping. You may want to start by applying warm, moist heat (in the shower or with warm, water-soaked hand towels) just before feeding or pumping. This increases circulation and helps the milk flow. If your baby does not completely empty your breasts while breastfeeding, pump any extra milk after he or she is finished.  Apply ice packs to your breasts immediately after  breastfeeding or pumping, unless this is too uncomfortable for you. To do this: ? Put ice in a plastic bag. ? Place a towel between your skin and the bag. ? Leave the ice on for 20 minutes, 2-3 times a day.  Make sure that your baby is latched on and positioned properly while breastfeeding. If engorgement persists after 48 hours of following these recommendations, contact your health care provider or a Advertising copywriter. Overall health care recommendations while breastfeeding  Eat 3 healthy meals and 3 snacks every day. Well-nourished mothers who are breastfeeding need an additional 450-500 calories a day. You can meet this requirement by increasing the amount of a balanced diet that you eat.  Drink enough water to keep your urine pale yellow or clear.  Rest often, relax, and continue to take your prenatal vitamins to prevent fatigue, stress, and low vitamin and mineral levels in your body (nutrient deficiencies).  Do not use any products that contain nicotine or tobacco, such as cigarettes and e-cigarettes. Your baby may be harmed by chemicals from cigarettes that pass into breast milk and exposure to secondhand smoke. If you need help quitting, ask your health care provider.  Avoid alcohol.  Do not use illegal drugs or  marijuana.  Talk with your health care provider before taking any medicines. These include over-the-counter and prescription medicines as well as vitamins and herbal supplements. Some medicines that may be harmful to your baby can pass through breast milk.  It is possible to become pregnant while breastfeeding. If birth control is desired, ask your health care provider about options that will be safe while breastfeeding your baby. Where to find more information: [employer]: www.llli.org Contact a health care provider if:  You feel like you want to stop breastfeeding or have become frustrated with breastfeeding.  Your nipples are cracked or bleeding.  Your breasts are red, tender, or warm.  You have: ? Painful breasts or nipples. ? A swollen area on either breast. ? A fever or chills. ? Nausea or vomiting. ? Drainage other than breast milk from your nipples.  Your breasts do not become full before feedings by the fifth day after you give birth.  You feel sad and depressed.  Your baby is: ? Too sleepy to eat well. ? Having trouble sleeping. ? More than 55 week old and wetting fewer than 6 diapers in a 24-hour period. ? Not gaining weight by 65 days of age.  Your baby has fewer than 3 stools in a 24-hour period.  Your baby's skin or the white parts of his or her eyes become yellow. Get help right away if:  Your baby is overly tired (lethargic) and does not want to wake up and feed.  Your baby develops an unexplained fever. Summary  Breastfeeding offers many health benefits for infant and mothers.  Try to breastfeed your infant when he or she shows early signs of hunger.  Gently tickle or stroke your baby's lips with your finger or nipple to allow the baby to open his or her mouth. Bring the baby to your breast. Make sure that much of the areola is in your baby's mouth. Offer one side and burp the baby before you offer the other side.  Talk with your  health care provider or lactation consultant if you have questions or you face problems as you breastfeed. This information is not intended to replace advice given to you by your health care provider. Make sure you discuss any  questions you have with your health care provider. Document Released: 11/28/2005 Document Revised: 12/30/2016 Document Reviewed: 12/30/2016 Elsevier Interactive Patient Education  2019 [employer].
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
## 2019-01-30 NOTE — Progress Notes (Signed)
OB US scheduled for February 21st 1430.  Pt notified.

## 2019-01-30 NOTE — BH Specialist Note (Signed)
Integrated Behavioral Health Initial Visit  MRN: [id number] Name: Christy West  Number of Integrated Behavioral Health Clinician visits:: 1/6 Session Start time: 10:28  Session End time: 10:39 Total time: 15 minutes  Type of Service: Integrated Behavioral Health- Individual/Family Interpretor:No. Interpretor Name and Language: n/a   Warm Hand Off Completed.       SUBJECTIVE: Christy West is a 33 y.o. female accompanied by n/a Patient was referred by Catalina Antigua, MD for Initial OB introduction to integrated behavioral health services . Patient reports the following symptoms/concerns: Pt states her primary concern is worry over having babies early; did not like being separated from previous baby in NICU.  Duration of problem: Current pregnancy; Severity of problem: moderate  OBJECTIVE: Mood: Anxious and Affect: Appropriate Risk of harm to self or others: No plan to harm self or others  LIFE CONTEXT: Family and Social: Pt lives with FOB and children (4yo; 3yo) School/Work: - Self-Care: - Life Changes: Current twin pregnancy  GOALS ADDRESSED: Patient will: 1. Reduce symptoms of: stress 2. Increase knowledge and/or ability of: stress reduction  3. Demonstrate ability to: Increase healthy adjustment to current life circumstances  INTERVENTIONS: Interventions utilized: Supportive Counseling and Psychoeducation and/or Health Education  Standardized Assessments completed: GAD-7 and PHQ 9 ([facility] did not see score until after patient left; address at next visit)  ASSESSMENT: Patient currently experiencing Supervision of high risk pregnancy, antepartum   Patient may benefit from Initial OB introduction to integrated behavioral health services .  PLAN: 1. Follow up with behavioral health clinician on : one month 2. Behavioral recommendations:  -Begin taking prenatal vitamin, as recommended by medical provider -Share new [facility] with FOB at  home 3. Referral(s): Integrated [employer] (In Clinic) 4. "From scale of 1-10, how likely are you to follow plan?": 10  Rae Lips, LCSW  Depression screen [facility] 2/9 01/30/2019 07/02/2014  Decreased Interest 3 0  Down, Depressed, Hopeless 1 0  PHQ - 2 Score 4 0  Altered sleeping 0 -  Tired, decreased energy 3 -  Change in appetite 2 -  Feeling bad or failure about yourself  1 -  Trouble concentrating 0 -  Moving slowly or fidgety/restless 1 -  Suicidal thoughts 0 -  PHQ-9 Score 11 -   GAD 7 : Generalized Anxiety Score 01/30/2019  Nervous, Anxious, on Edge 2  Control/stop worrying 2  Worry too much - different things 2  Trouble relaxing 3  Restless 3  Easily annoyed or irritable 3  Afraid - awful might happen 1  Total GAD 7 Score 16

## 2019-02-01 ENCOUNTER — Other Ambulatory Visit: Payer: Self-pay | Admitting: Obstetrics and Gynecology

## 2019-02-01 ENCOUNTER — Encounter (HOSPITAL_COMMUNITY): Payer: Self-pay

## 2019-02-01 ENCOUNTER — Ambulatory Visit (HOSPITAL_COMMUNITY)
Admission: RE | Admit: 2019-02-01 | Discharge: 2019-02-01 | Disposition: A | Discharge status: Home / Self Care | Payer: Medicaid Other | Source: Ambulatory Visit | Attending: Obstetrics and Gynecology | Admitting: Obstetrics and Gynecology

## 2019-02-01 DIAGNOSIS — Z3A21 21 weeks gestation of pregnancy: Secondary | ICD-10-CM

## 2019-02-01 DIAGNOSIS — O30009 Twin pregnancy, unspecified number of placenta and unspecified number of amniotic sacs, unspecified trimester: Secondary | ICD-10-CM | POA: Insufficient documentation

## 2019-02-01 DIAGNOSIS — O09212 Supervision of pregnancy with history of pre-term labor, second trimester: Secondary | ICD-10-CM

## 2019-02-01 DIAGNOSIS — O99322 Drug use complicating pregnancy, second trimester: Secondary | ICD-10-CM

## 2019-02-01 DIAGNOSIS — O0990 Supervision of high risk pregnancy, unspecified, unspecified trimester: Secondary | ICD-10-CM

## 2019-02-01 DIAGNOSIS — O30042 Twin pregnancy, dichorionic/diamniotic, second trimester: Secondary | ICD-10-CM

## 2019-02-01 DIAGNOSIS — Z363 Encounter for antenatal screening for malformations: Secondary | ICD-10-CM

## 2019-02-03 ENCOUNTER — Encounter: Payer: Self-pay | Admitting: Obstetrics and Gynecology

## 2019-02-03 LAB — URINE CULTURE, OB REFLEX

## 2019-02-03 LAB — CULTURE, OB URINE

## 2019-02-05 ENCOUNTER — Other Ambulatory Visit (HOSPITAL_COMMUNITY): Payer: Self-pay | Admitting: *Deleted

## 2019-02-05 DIAGNOSIS — O30042 Twin pregnancy, dichorionic/diamniotic, second trimester: Secondary | ICD-10-CM

## 2019-02-05 NOTE — Progress Notes (Signed)
Us m 

## 2019-02-06 ENCOUNTER — Other Ambulatory Visit (HOSPITAL_COMMUNITY): Payer: Self-pay | Admitting: *Deleted

## 2019-02-06 DIAGNOSIS — O30049 Twin pregnancy, dichorionic/diamniotic, unspecified trimester: Secondary | ICD-10-CM

## 2019-02-06 LAB — CYTOLOGY - PAP
BACTERIAL VAGINITIS: POSITIVE — AB
Candida vaginitis: NEGATIVE
Chlamydia: NEGATIVE
DIAGNOSIS: NEGATIVE
HPV: NOT DETECTED
NEISSERIA GONORRHEA: NEGATIVE

## 2019-02-06 MED ORDER — METRONIDAZOLE 500 MG PO TABS: 500.0000 mg | ORAL_TABLET | Freq: Two times a day (BID) | ORAL | 0 refills | Status: DC

## 2019-02-06 NOTE — Addendum Note (Signed)
Addended by: Catalina Antigua on: 02/06/2019 04:23 PM   Modules accepted: Orders

## 2019-02-11 LAB — HEMOGLOBINOPATHY EVALUATION
FERRITIN: 21 ng/mL (ref 15–150)
HGB SOLUBILITY: NEGATIVE
HGB VARIANT: 0 %
Hgb A2 Quant: 1.9 % (ref 1.8–3.2)
Hgb A: 98.1 % (ref 96.4–98.8)
Hgb C: 0 %
Hgb F Quant: 0 % (ref 0.0–2.0)
Hgb S: 0 %

## 2019-02-11 LAB — OBSTETRIC PANEL, INCLUDING HIV
ANTIBODY SCREEN: NEGATIVE
BASOS: 0 %
Basophils Absolute: 0 10*3/uL (ref 0.0–0.2)
EOS (ABSOLUTE): 0.1 10*3/uL (ref 0.0–0.4)
Eos: 1 %
HEMATOCRIT: 26.2 % — AB (ref 34.0–46.6)
HIV SCREEN 4TH GENERATION: NONREACTIVE
Hemoglobin: 8.8 g/dL — ABNORMAL LOW (ref 11.1–15.9)
Hepatitis B Surface Ag: NEGATIVE
IMMATURE GRANS (ABS): 0 10*3/uL (ref 0.0–0.1)
Immature Granulocytes: 0 %
LYMPHS: 29 %
Lymphocytes Absolute: 1.8 10*3/uL (ref 0.7–3.1)
MCH: 30.3 pg (ref 26.6–33.0)
MCHC: 33.6 g/dL (ref 31.5–35.7)
MCV: 90 fL (ref 79–97)
MONOS ABS: 0.5 10*3/uL (ref 0.1–0.9)
Monocytes: 8 %
Neutrophils Absolute: 3.9 10*3/uL (ref 1.4–7.0)
Neutrophils: 62 %
Platelets: 244 10*3/uL (ref 150–450)
RBC: 2.9 x10E6/uL — AB (ref 3.77–5.28)
RDW: 13.1 % (ref 11.7–15.4)
RPR Ser Ql: NONREACTIVE
Rh Factor: POSITIVE
Rubella Antibodies, IGG: 8.65 index (ref >0.99)
WBC: 6.3 10*3/uL (ref 3.4–10.8)

## 2019-02-11 LAB — INHERITEST(R) CF/SMA PANEL

## 2019-02-11 MED ORDER — SODIUM CHLORIDE 0.9 % IV SOLN: 510.0000 mg | INTRAVENOUS | Status: AC

## 2019-02-11 NOTE — Addendum Note (Signed)
Addended by: Catalina Antigua on: 02/11/2019 04:18 PM   Modules accepted: Orders, SmartSet

## 2019-02-14 ENCOUNTER — Telehealth: Payer: Self-pay | Admitting: *Deleted

## 2019-02-14 NOTE — Telephone Encounter (Signed)
-----   Message from Catalina Antigua, MD sent at 02/11/2019  4:17 PM EST ----- Please inform patient of anemia and need for feraheme transfusion. Order placed in epic  Thanks  Peggy

## 2019-02-14 NOTE — Telephone Encounter (Signed)
Called and scheduled fereheme for 02/19/19 at 12:00.  Called Huong and informed her she is anemic and Dr.Constant ordered fereheme infusion.Gave her appt date/ time .  Instructed her to go to [facility] main entrance and will be directed to short stay . She voices understanding.

## 2019-02-19 ENCOUNTER — Encounter (HOSPITAL_COMMUNITY): Payer: Self-pay

## 2019-02-26 ENCOUNTER — Encounter: Payer: Self-pay | Admitting: *Deleted

## 2019-02-26 ENCOUNTER — Telehealth: Payer: Self-pay | Admitting: Obstetrics & Gynecology

## 2019-02-26 NOTE — Telephone Encounter (Signed)
Patient called and VM left about only one visitor.

## 2019-02-27 ENCOUNTER — Encounter: Payer: Self-pay | Admitting: Obstetrics & Gynecology

## 2019-02-27 ENCOUNTER — Encounter: Payer: Medicaid Other | Admitting: Obstetrics & Gynecology

## 2019-02-27 ENCOUNTER — Telehealth: Payer: Self-pay | Admitting: Obstetrics & Gynecology

## 2019-02-27 NOTE — Telephone Encounter (Signed)
Attempted to call patient to get her rescheduled for her missed [facility] appointment on 3/18. No answer, left message to give the office a call to be rescheduled. No show letter mailed.

## 2019-02-28 ENCOUNTER — Other Ambulatory Visit: Payer: Self-pay

## 2019-02-28 ENCOUNTER — Ambulatory Visit (INDEPENDENT_AMBULATORY_CARE_PROVIDER_SITE_OTHER): Payer: Medicaid Other | Admitting: Obstetrics and Gynecology

## 2019-02-28 VITALS — BP 104/62 | HR 82 | Temp 98.6°F | Wt 163.0 lb

## 2019-02-28 DIAGNOSIS — Z3A25 25 weeks gestation of pregnancy: Secondary | ICD-10-CM

## 2019-02-28 DIAGNOSIS — O0990 Supervision of high risk pregnancy, unspecified, unspecified trimester: Secondary | ICD-10-CM

## 2019-02-28 DIAGNOSIS — O3442 Maternal care for other abnormalities of cervix, second trimester: Secondary | ICD-10-CM

## 2019-02-28 DIAGNOSIS — O30042 Twin pregnancy, dichorionic/diamniotic, second trimester: Secondary | ICD-10-CM

## 2019-02-28 DIAGNOSIS — O09899 Supervision of other high risk pregnancies, unspecified trimester: Secondary | ICD-10-CM

## 2019-02-28 DIAGNOSIS — B009 Herpesviral infection, unspecified: Secondary | ICD-10-CM

## 2019-02-28 DIAGNOSIS — O30049 Twin pregnancy, dichorionic/diamniotic, unspecified trimester: Secondary | ICD-10-CM

## 2019-02-28 DIAGNOSIS — O09219 Supervision of pregnancy with history of pre-term labor, unspecified trimester: Secondary | ICD-10-CM

## 2019-02-28 DIAGNOSIS — O3440 Maternal care for other abnormalities of cervix, unspecified trimester: Secondary | ICD-10-CM

## 2019-02-28 DIAGNOSIS — O99012 Anemia complicating pregnancy, second trimester: Secondary | ICD-10-CM

## 2019-02-28 DIAGNOSIS — O283 Abnormal ultrasonic finding on antenatal screening of mother: Secondary | ICD-10-CM | POA: Insufficient documentation

## 2019-02-28 DIAGNOSIS — O99013 Anemia complicating pregnancy, third trimester: Secondary | ICD-10-CM

## 2019-02-28 DIAGNOSIS — O98512 Other viral diseases complicating pregnancy, second trimester: Secondary | ICD-10-CM

## 2019-02-28 DIAGNOSIS — O09212 Supervision of pregnancy with history of pre-term labor, second trimester: Secondary | ICD-10-CM

## 2019-02-28 MED ORDER — PREPLUS 27-1 MG PO TABS: 1.0000 | ORAL_TABLET | Freq: Every day | ORAL | 3 refills | Status: DC

## 2019-02-28 MED ORDER — PROGESTERONE MICRONIZED 200 MG PO CAPS: ORAL_CAPSULE | ORAL | 3 refills | Status: DC

## 2019-02-28 MED ORDER — FOLIC ACID 1 MG PO TABS: 1.0000 mg | ORAL_TABLET | Freq: Every day | ORAL | 3 refills | Status: DC
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
## 2019-03-01 ENCOUNTER — Ambulatory Visit (HOSPITAL_COMMUNITY): Payer: Medicaid Other | Admitting: *Deleted

## 2019-03-01 ENCOUNTER — Ambulatory Visit (HOSPITAL_COMMUNITY)
Admission: RE | Admit: 2019-03-01 | Discharge: 2019-03-01 | Disposition: A | Discharge status: Home / Self Care | Payer: Medicaid Other | Source: Ambulatory Visit | Attending: Obstetrics and Gynecology | Admitting: Obstetrics and Gynecology

## 2019-03-01 ENCOUNTER — Encounter (HOSPITAL_COMMUNITY): Payer: Self-pay

## 2019-03-01 ENCOUNTER — Other Ambulatory Visit (HOSPITAL_COMMUNITY): Payer: Self-pay | Admitting: Obstetrics and Gynecology

## 2019-03-01 VITALS — BP 114/75 | HR 101 | Temp 98.7°F | Wt 160.0 lb

## 2019-03-01 DIAGNOSIS — O0990 Supervision of high risk pregnancy, unspecified, unspecified trimester: Secondary | ICD-10-CM | POA: Diagnosis present

## 2019-03-01 DIAGNOSIS — O30042 Twin pregnancy, dichorionic/diamniotic, second trimester: Secondary | ICD-10-CM

## 2019-03-01 DIAGNOSIS — O365921 Maternal care for other known or suspected poor fetal growth, second trimester, fetus 1: Secondary | ICD-10-CM

## 2019-03-01 DIAGNOSIS — O09212 Supervision of pregnancy with history of pre-term labor, second trimester: Secondary | ICD-10-CM | POA: Diagnosis not present

## 2019-03-01 DIAGNOSIS — O30049 Twin pregnancy, dichorionic/diamniotic, unspecified trimester: Secondary | ICD-10-CM | POA: Insufficient documentation

## 2019-03-01 DIAGNOSIS — O99322 Drug use complicating pregnancy, second trimester: Secondary | ICD-10-CM

## 2019-03-01 DIAGNOSIS — Z362 Encounter for other antenatal screening follow-up: Secondary | ICD-10-CM | POA: Diagnosis not present

## 2019-03-01 DIAGNOSIS — Z3A25 25 weeks gestation of pregnancy: Secondary | ICD-10-CM

## 2019-03-04 ENCOUNTER — Other Ambulatory Visit (HOSPITAL_COMMUNITY): Payer: Self-pay | Admitting: *Deleted

## 2019-03-04 DIAGNOSIS — O30049 Twin pregnancy, dichorionic/diamniotic, unspecified trimester: Secondary | ICD-10-CM

## 2019-03-07 ENCOUNTER — Ambulatory Visit (HOSPITAL_COMMUNITY): Payer: Medicaid Other | Attending: Obstetrics and Gynecology

## 2019-03-08 ENCOUNTER — Ambulatory Visit (HOSPITAL_COMMUNITY)
Admission: RE | Admit: 2019-03-08 | Discharge: 2019-03-08 | Disposition: A | Discharge status: Home / Self Care | Payer: Medicaid Other | Source: Ambulatory Visit | Attending: Obstetrics and Gynecology | Admitting: Obstetrics and Gynecology

## 2019-03-08 ENCOUNTER — Other Ambulatory Visit: Payer: Self-pay

## 2019-03-08 ENCOUNTER — Ambulatory Visit (HOSPITAL_COMMUNITY): Payer: Medicaid Other | Admitting: *Deleted

## 2019-03-08 ENCOUNTER — Encounter (HOSPITAL_COMMUNITY): Payer: Self-pay

## 2019-03-08 VITALS — BP 113/65 | HR 76 | Temp 98.2°F

## 2019-03-08 DIAGNOSIS — O30049 Twin pregnancy, dichorionic/diamniotic, unspecified trimester: Secondary | ICD-10-CM

## 2019-03-08 DIAGNOSIS — O0990 Supervision of high risk pregnancy, unspecified, unspecified trimester: Secondary | ICD-10-CM

## 2019-03-08 DIAGNOSIS — O30042 Twin pregnancy, dichorionic/diamniotic, second trimester: Secondary | ICD-10-CM | POA: Diagnosis not present

## 2019-03-08 NOTE — Progress Notes (Signed)
Left message with Devonne Doughty, RN in NICU to schedule NICU consult for Christy West.

## 2019-03-11 ENCOUNTER — Other Ambulatory Visit: Payer: Self-pay | Admitting: *Deleted

## 2019-03-11 DIAGNOSIS — O30049 Twin pregnancy, dichorionic/diamniotic, unspecified trimester: Secondary | ICD-10-CM

## 2019-03-11 DIAGNOSIS — O09219 Supervision of pregnancy with history of pre-term labor, unspecified trimester: Secondary | ICD-10-CM

## 2019-03-11 DIAGNOSIS — O0990 Supervision of high risk pregnancy, unspecified, unspecified trimester: Secondary | ICD-10-CM

## 2019-03-11 DIAGNOSIS — O09899 Supervision of other high risk pregnancies, unspecified trimester: Secondary | ICD-10-CM

## 2019-03-11 DIAGNOSIS — O3440 Maternal care for other abnormalities of cervix, unspecified trimester: Secondary | ICD-10-CM

## 2019-03-12 ENCOUNTER — Telehealth: Payer: Self-pay | Admitting: Family Medicine

## 2019-03-12 NOTE — Telephone Encounter (Signed)
The patient called due to a missed call. Informed of missed appointment and new appointment on Thursday. Informed the patient of the COVID19 restrictions.

## 2019-03-13 ENCOUNTER — Telehealth: Payer: Self-pay | Admitting: Family Medicine

## 2019-03-13 ENCOUNTER — Inpatient Hospital Stay (HOSPITAL_COMMUNITY): Admission: RE | Admit: 2019-03-13 | Payer: Self-pay | Source: Ambulatory Visit

## 2019-03-13 NOTE — Telephone Encounter (Signed)
Called the mobile line- voicemail Home line- a friend gave a number to reach out to the patient. [phone].  Reaching out to the patient to inform of the restrictions due to the COVID19. Left a message on both the home line and the [phone] stating to give our clinic a call back.

## 2019-03-14 ENCOUNTER — Other Ambulatory Visit: Payer: Medicaid Other

## 2019-03-14 ENCOUNTER — Ambulatory Visit (INDEPENDENT_AMBULATORY_CARE_PROVIDER_SITE_OTHER): Payer: Medicaid Other | Admitting: Family Medicine

## 2019-03-14 ENCOUNTER — Other Ambulatory Visit: Payer: Self-pay

## 2019-03-14 ENCOUNTER — Ambulatory Visit (HOSPITAL_COMMUNITY): Payer: Medicaid Other

## 2019-03-14 ENCOUNTER — Telehealth: Payer: Self-pay | Admitting: Lactation Services

## 2019-03-14 VITALS — BP 126/72 | HR 89 | Temp 98.0°F | Wt 166.6 lb

## 2019-03-14 DIAGNOSIS — O98812 Other maternal infectious and parasitic diseases complicating pregnancy, second trimester: Secondary | ICD-10-CM | POA: Diagnosis not present

## 2019-03-14 DIAGNOSIS — O30049 Twin pregnancy, dichorionic/diamniotic, unspecified trimester: Secondary | ICD-10-CM | POA: Diagnosis present

## 2019-03-14 DIAGNOSIS — B009 Herpesviral infection, unspecified: Secondary | ICD-10-CM

## 2019-03-14 DIAGNOSIS — O09219 Supervision of pregnancy with history of pre-term labor, unspecified trimester: Secondary | ICD-10-CM

## 2019-03-14 DIAGNOSIS — O09212 Supervision of pregnancy with history of pre-term labor, second trimester: Secondary | ICD-10-CM

## 2019-03-14 DIAGNOSIS — O09899 Supervision of other high risk pregnancies, unspecified trimester: Secondary | ICD-10-CM

## 2019-03-14 DIAGNOSIS — O365921 Maternal care for other known or suspected poor fetal growth, second trimester, fetus 1: Secondary | ICD-10-CM | POA: Insufficient documentation

## 2019-03-14 DIAGNOSIS — O283 Abnormal ultrasonic finding on antenatal screening of mother: Secondary | ICD-10-CM | POA: Diagnosis not present

## 2019-03-14 DIAGNOSIS — O0990 Supervision of high risk pregnancy, unspecified, unspecified trimester: Secondary | ICD-10-CM

## 2019-03-14 DIAGNOSIS — Z3A27 27 weeks gestation of pregnancy: Secondary | ICD-10-CM

## 2019-03-14 DIAGNOSIS — O99013 Anemia complicating pregnancy, third trimester: Secondary | ICD-10-CM | POA: Diagnosis not present

## 2019-03-14 DIAGNOSIS — O3440 Maternal care for other abnormalities of cervix, unspecified trimester: Secondary | ICD-10-CM

## 2019-03-14 DIAGNOSIS — O30042 Twin pregnancy, dichorionic/diamniotic, second trimester: Secondary | ICD-10-CM | POA: Diagnosis not present

## 2019-03-14 DIAGNOSIS — Z23 Encounter for immunization: Secondary | ICD-10-CM

## 2019-03-14 MED ORDER — BETAMETHASONE SOD PHOS & ACET 6 (3-3) MG/ML IJ SUSP
12.0000 mg | Freq: Once | INTRAMUSCULAR | Status: AC
  Administered 2019-03-14: 11:00:00 12 mg via INTRAMUSCULAR

## 2019-03-14 NOTE — Telephone Encounter (Signed)
Spoke with patient while here for OB appt. Pt reports baby A is not growing as well and most likely will be delivered between 28-30 weeks.   Mom is aware of the importance of providing breast milk for preterm infants. Mom does not have a pump at home. She recently has moved back from [place] and has not signed up for [facility]. Discussed with mom that  Good DEBP would be recommended since she is having preterm twins that will require a NICU stay. Mom reports she pumped for her older son who was in the NICU for 3 weeks and then latched him briefly and then changed to formula at about 69 weeks of age.   Discussed that she would be set up with a DEBP in the hospital with supplies for pumping. Discussed keeping her tubing and using the hospital grade pumps in the NOCU when visiting infant.   Mom reports increased veining to the breasts and colostrum leakage. Mom wearing tape over nipples due to pain. Gave her some breast pads to wear instead. Discussed not expressing milk manually at this time due to preterm twins and risk of stimulating preterm labor.   Enc mom to ask for Lactation assistance in the hospital. Mom reports she has no questions/concerns at this time.
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
## 2019-03-15 ENCOUNTER — Encounter (HOSPITAL_COMMUNITY): Payer: Self-pay

## 2019-03-15 ENCOUNTER — Ambulatory Visit (HOSPITAL_COMMUNITY): Payer: Medicaid Other

## 2019-03-15 ENCOUNTER — Ambulatory Visit (HOSPITAL_COMMUNITY)
Admission: RE | Admit: 2019-03-15 | Discharge: 2019-03-15 | Disposition: A | Discharge status: Home / Self Care | Payer: Medicaid Other | Source: Ambulatory Visit | Attending: Obstetrics and Gynecology | Admitting: Obstetrics and Gynecology

## 2019-03-15 ENCOUNTER — Other Ambulatory Visit (HOSPITAL_COMMUNITY): Payer: Self-pay

## 2019-03-15 ENCOUNTER — Ambulatory Visit (HOSPITAL_COMMUNITY): Payer: Medicaid Other | Admitting: *Deleted

## 2019-03-15 VITALS — BP 112/65 | HR 82 | Temp 98.5°F

## 2019-03-15 DIAGNOSIS — O365921 Maternal care for other known or suspected poor fetal growth, second trimester, fetus 1: Secondary | ICD-10-CM

## 2019-03-15 DIAGNOSIS — O09212 Supervision of pregnancy with history of pre-term labor, second trimester: Secondary | ICD-10-CM | POA: Diagnosis not present

## 2019-03-15 DIAGNOSIS — O99322 Drug use complicating pregnancy, second trimester: Secondary | ICD-10-CM | POA: Diagnosis not present

## 2019-03-15 DIAGNOSIS — O30049 Twin pregnancy, dichorionic/diamniotic, unspecified trimester: Secondary | ICD-10-CM | POA: Diagnosis not present

## 2019-03-15 DIAGNOSIS — Z3A27 27 weeks gestation of pregnancy: Secondary | ICD-10-CM

## 2019-03-15 DIAGNOSIS — O30042 Twin pregnancy, dichorionic/diamniotic, second trimester: Secondary | ICD-10-CM

## 2019-03-15 DIAGNOSIS — O0990 Supervision of high risk pregnancy, unspecified, unspecified trimester: Secondary | ICD-10-CM

## 2019-03-15 LAB — RPR: RPR Ser Ql: NONREACTIVE

## 2019-03-15 LAB — CBC
Hematocrit: 28.4 % — ABNORMAL LOW (ref 34.0–46.6)
Hemoglobin: 9.4 g/dL — ABNORMAL LOW (ref 11.1–15.9)
MCH: 30.7 pg (ref 26.6–33.0)
MCHC: 33.1 g/dL (ref 31.5–35.7)
MCV: 93 fL (ref 79–97)
Platelets: 273 10*3/uL (ref 150–450)
RBC: 3.06 x10E6/uL — ABNORMAL LOW (ref 3.77–5.28)
RDW: 13.4 % (ref 11.7–15.4)
WBC: 5.9 10*3/uL (ref 3.4–10.8)

## 2019-03-15 LAB — GLUCOSE TOLERANCE, 2 HOURS W/ 1HR
Glucose, 1 hour: 99 mg/dL (ref 65–179)
Glucose, 2 hour: 96 mg/dL (ref 65–152)
Glucose, Fasting: 77 mg/dL (ref 65–91)

## 2019-03-15 LAB — HIV ANTIBODY (ROUTINE TESTING W REFLEX): HIV Screen 4th Generation wRfx: NONREACTIVE

## 2019-03-15 MED ORDER — BETAMETHASONE SOD PHOS & ACET 6 (3-3) MG/ML IJ SUSP
12.0000 mg | Freq: Once | INTRAMUSCULAR | Status: AC
  Administered 2019-03-15: 14:00:00 12 mg via INTRAMUSCULAR

## 2019-03-19 ENCOUNTER — Encounter: Payer: Self-pay | Admitting: *Deleted

## 2019-03-19 ENCOUNTER — Telehealth: Payer: Self-pay | Admitting: Medical

## 2019-03-19 NOTE — Telephone Encounter (Signed)
The patient called to have a prescription filled for a back brace. Informed a message would be sent to the nurse.

## 2019-03-20 ENCOUNTER — Encounter (HOSPITAL_COMMUNITY): Payer: Self-pay

## 2019-03-21 ENCOUNTER — Other Ambulatory Visit (HOSPITAL_COMMUNITY): Payer: Self-pay

## 2019-03-22 ENCOUNTER — Encounter (HOSPITAL_COMMUNITY): Payer: Self-pay

## 2019-03-22 ENCOUNTER — Ambulatory Visit (HOSPITAL_COMMUNITY)
Admission: RE | Admit: 2019-03-22 | Discharge: 2019-03-22 | Disposition: A | Discharge status: Home / Self Care | Payer: Medicaid Other | Source: Ambulatory Visit | Attending: Obstetrics and Gynecology | Admitting: Obstetrics and Gynecology

## 2019-03-22 ENCOUNTER — Other Ambulatory Visit: Payer: Self-pay

## 2019-03-22 ENCOUNTER — Ambulatory Visit (HOSPITAL_COMMUNITY): Payer: Medicaid Other | Admitting: *Deleted

## 2019-03-22 ENCOUNTER — Ambulatory Visit (HOSPITAL_COMMUNITY): Payer: Medicaid Other

## 2019-03-22 VITALS — Temp 98.3°F

## 2019-03-22 DIAGNOSIS — O365931 Maternal care for other known or suspected poor fetal growth, third trimester, fetus 1: Secondary | ICD-10-CM

## 2019-03-22 DIAGNOSIS — O30049 Twin pregnancy, dichorionic/diamniotic, unspecified trimester: Secondary | ICD-10-CM

## 2019-03-22 DIAGNOSIS — O30043 Twin pregnancy, dichorionic/diamniotic, third trimester: Secondary | ICD-10-CM

## 2019-03-22 DIAGNOSIS — O0990 Supervision of high risk pregnancy, unspecified, unspecified trimester: Secondary | ICD-10-CM | POA: Insufficient documentation

## 2019-03-22 DIAGNOSIS — O09213 Supervision of pregnancy with history of pre-term labor, third trimester: Secondary | ICD-10-CM

## 2019-03-22 DIAGNOSIS — O99323 Drug use complicating pregnancy, third trimester: Secondary | ICD-10-CM | POA: Diagnosis not present

## 2019-03-22 DIAGNOSIS — Z362 Encounter for other antenatal screening follow-up: Secondary | ICD-10-CM | POA: Diagnosis not present

## 2019-03-22 DIAGNOSIS — Z3A28 28 weeks gestation of pregnancy: Secondary | ICD-10-CM

## 2019-03-22 NOTE — Telephone Encounter (Signed)
Called pt and advised her to come into the office on Monday the 13th to get a Paper Rx for her pregnancy support belt and gave her the info on [employer] to pick up the belt. Pt verbalized understanding and stated she had a MFM  appointment today and was wondering if she could get it on today. Advised her that we didn't have any providers here authorized to sign her Rx and pt verbalized understanding and ended the call.

## 2019-03-26 ENCOUNTER — Other Ambulatory Visit: Payer: Self-pay

## 2019-03-26 ENCOUNTER — Other Ambulatory Visit: Payer: Self-pay | Admitting: Obstetrics & Gynecology

## 2019-03-26 ENCOUNTER — Other Ambulatory Visit (HOSPITAL_COMMUNITY): Payer: Self-pay | Admitting: *Deleted

## 2019-03-26 DIAGNOSIS — O0990 Supervision of high risk pregnancy, unspecified, unspecified trimester: Secondary | ICD-10-CM

## 2019-03-26 DIAGNOSIS — O30043 Twin pregnancy, dichorionic/diamniotic, third trimester: Secondary | ICD-10-CM

## 2019-03-26 DIAGNOSIS — O365931 Maternal care for other known or suspected poor fetal growth, third trimester, fetus 1: Secondary | ICD-10-CM

## 2019-03-26 MED ORDER — COMFORT FIT MATERNITY SUPP MED MISC: 1.0000 | Freq: Every day | 0 refills | Status: DC

## 2019-03-26 MED ORDER — COMFORT FIT MATERNITY SUPP MED MISC: 0 refills | Status: DC

## 2019-03-26 NOTE — Progress Notes (Unsigned)
comfort

## 2019-03-29 ENCOUNTER — Encounter (HOSPITAL_COMMUNITY): Payer: Self-pay

## 2019-03-29 ENCOUNTER — Encounter: Payer: Self-pay | Admitting: Obstetrics & Gynecology

## 2019-03-29 ENCOUNTER — Ambulatory Visit (HOSPITAL_COMMUNITY): Payer: Medicaid Other

## 2019-03-29 ENCOUNTER — Ambulatory Visit (HOSPITAL_COMMUNITY)
Admission: RE | Admit: 2019-03-29 | Payer: Medicaid Other | Source: Ambulatory Visit | Attending: Obstetrics and Gynecology | Admitting: Obstetrics and Gynecology

## 2019-04-02 ENCOUNTER — Encounter (HOSPITAL_COMMUNITY): Payer: Self-pay | Admitting: *Deleted

## 2019-04-02 ENCOUNTER — Other Ambulatory Visit: Payer: Self-pay

## 2019-04-02 ENCOUNTER — Ambulatory Visit (HOSPITAL_COMMUNITY)
Admission: RE | Admit: 2019-04-02 | Discharge: 2019-04-02 | Disposition: A | Discharge status: Home / Self Care | Payer: Medicaid Other | Source: Ambulatory Visit | Attending: Obstetrics and Gynecology | Admitting: Obstetrics and Gynecology

## 2019-04-02 ENCOUNTER — Telehealth: Payer: Self-pay | Admitting: Family Medicine

## 2019-04-02 ENCOUNTER — Ambulatory Visit (HOSPITAL_COMMUNITY): Payer: Medicaid Other | Admitting: *Deleted

## 2019-04-02 DIAGNOSIS — O99323 Drug use complicating pregnancy, third trimester: Secondary | ICD-10-CM | POA: Diagnosis not present

## 2019-04-02 DIAGNOSIS — O30043 Twin pregnancy, dichorionic/diamniotic, third trimester: Secondary | ICD-10-CM | POA: Diagnosis present

## 2019-04-02 DIAGNOSIS — O365931 Maternal care for other known or suspected poor fetal growth, third trimester, fetus 1: Secondary | ICD-10-CM | POA: Diagnosis present

## 2019-04-02 DIAGNOSIS — O30049 Twin pregnancy, dichorionic/diamniotic, unspecified trimester: Secondary | ICD-10-CM

## 2019-04-02 DIAGNOSIS — Z3A29 29 weeks gestation of pregnancy: Secondary | ICD-10-CM

## 2019-04-02 DIAGNOSIS — O99013 Anemia complicating pregnancy, third trimester: Secondary | ICD-10-CM

## 2019-04-02 DIAGNOSIS — O09213 Supervision of pregnancy with history of pre-term labor, third trimester: Secondary | ICD-10-CM | POA: Diagnosis not present

## 2019-04-02 DIAGNOSIS — O0990 Supervision of high risk pregnancy, unspecified, unspecified trimester: Secondary | ICD-10-CM | POA: Insufficient documentation

## 2019-04-02 NOTE — Telephone Encounter (Signed)
Patient is requesting a RX for her Prenatal's and Folic Acid meds

## 2019-04-02 NOTE — Telephone Encounter (Signed)
Returned pt's call regarding her prescriptions for Prenatal Vitamins and Folic Acid.  Pt did not pick up.  Left voicemail advising pt that she was being contacted regarding her refills and she has several refills left on the medications.

## 2019-04-03 ENCOUNTER — Other Ambulatory Visit (HOSPITAL_COMMUNITY): Payer: Self-pay | Admitting: *Deleted

## 2019-04-03 DIAGNOSIS — O365931 Maternal care for other known or suspected poor fetal growth, third trimester, fetus 1: Secondary | ICD-10-CM

## 2019-04-05 ENCOUNTER — Ambulatory Visit (HOSPITAL_COMMUNITY): Payer: Medicaid Other | Admitting: *Deleted

## 2019-04-05 ENCOUNTER — Ambulatory Visit (HOSPITAL_BASED_OUTPATIENT_CLINIC_OR_DEPARTMENT_OTHER)
Admission: RE | Admit: 2019-04-05 | Discharge: 2019-04-05 | Disposition: A | Discharge status: Home / Self Care | Payer: Medicaid Other | Source: Ambulatory Visit | Attending: Obstetrics and Gynecology | Admitting: Obstetrics and Gynecology

## 2019-04-05 ENCOUNTER — Inpatient Hospital Stay (HOSPITAL_COMMUNITY): Payer: Medicaid Other

## 2019-04-05 ENCOUNTER — Encounter (HOSPITAL_COMMUNITY): Payer: Self-pay

## 2019-04-05 ENCOUNTER — Other Ambulatory Visit: Payer: Self-pay

## 2019-04-05 ENCOUNTER — Inpatient Hospital Stay (HOSPITAL_COMMUNITY)
Admission: AD | Admit: 2019-04-05 | Discharge: 2019-04-08 | DRG: 833 | Disposition: A | Discharge status: Home / Self Care | Payer: Medicaid Other | Attending: Obstetrics and Gynecology | Admitting: Obstetrics and Gynecology

## 2019-04-05 VITALS — BP 108/61 | HR 92 | Temp 98.5°F | Wt 172.4 lb

## 2019-04-05 DIAGNOSIS — O0990 Supervision of high risk pregnancy, unspecified, unspecified trimester: Secondary | ICD-10-CM

## 2019-04-05 DIAGNOSIS — O99323 Drug use complicating pregnancy, third trimester: Secondary | ICD-10-CM | POA: Diagnosis not present

## 2019-04-05 DIAGNOSIS — Z3A30 30 weeks gestation of pregnancy: Secondary | ICD-10-CM | POA: Diagnosis not present

## 2019-04-05 DIAGNOSIS — O30049 Twin pregnancy, dichorionic/diamniotic, unspecified trimester: Secondary | ICD-10-CM

## 2019-04-05 DIAGNOSIS — O30043 Twin pregnancy, dichorionic/diamniotic, third trimester: Secondary | ICD-10-CM | POA: Insufficient documentation

## 2019-04-05 DIAGNOSIS — O368931 Maternal care for other specified fetal problems, third trimester, fetus 1: Secondary | ICD-10-CM | POA: Diagnosis not present

## 2019-04-05 DIAGNOSIS — O99013 Anemia complicating pregnancy, third trimester: Secondary | ICD-10-CM

## 2019-04-05 DIAGNOSIS — O30009 Twin pregnancy, unspecified number of placenta and unspecified number of amniotic sacs, unspecified trimester: Secondary | ICD-10-CM

## 2019-04-05 DIAGNOSIS — F17210 Nicotine dependence, cigarettes, uncomplicated: Secondary | ICD-10-CM | POA: Diagnosis present

## 2019-04-05 DIAGNOSIS — O365931 Maternal care for other known or suspected poor fetal growth, third trimester, fetus 1: Secondary | ICD-10-CM

## 2019-04-05 DIAGNOSIS — O99333 Smoking (tobacco) complicating pregnancy, third trimester: Secondary | ICD-10-CM | POA: Diagnosis present

## 2019-04-05 DIAGNOSIS — O365911 Maternal care for other known or suspected poor fetal growth, first trimester, fetus 1: Secondary | ICD-10-CM | POA: Diagnosis not present

## 2019-04-05 DIAGNOSIS — O365932 Maternal care for other known or suspected poor fetal growth, third trimester, fetus 2: Secondary | ICD-10-CM | POA: Diagnosis not present

## 2019-04-05 DIAGNOSIS — O365990 Maternal care for other known or suspected poor fetal growth, unspecified trimester, not applicable or unspecified: Secondary | ICD-10-CM

## 2019-04-05 DIAGNOSIS — O283 Abnormal ultrasonic finding on antenatal screening of mother: Secondary | ICD-10-CM | POA: Diagnosis not present

## 2019-04-05 DIAGNOSIS — O358XX1 Maternal care for other (suspected) fetal abnormality and damage, fetus 1: Secondary | ICD-10-CM | POA: Diagnosis present

## 2019-04-05 DIAGNOSIS — O365930 Maternal care for other known or suspected poor fetal growth, third trimester, not applicable or unspecified: Secondary | ICD-10-CM | POA: Diagnosis not present

## 2019-04-05 DIAGNOSIS — O09213 Supervision of pregnancy with history of pre-term labor, third trimester: Secondary | ICD-10-CM

## 2019-04-05 DIAGNOSIS — D649 Anemia, unspecified: Secondary | ICD-10-CM | POA: Diagnosis present

## 2019-04-05 DIAGNOSIS — O365910 Maternal care for other known or suspected poor fetal growth, first trimester, not applicable or unspecified: Secondary | ICD-10-CM | POA: Diagnosis not present

## 2019-04-05 DIAGNOSIS — O365912 Maternal care for other known or suspected poor fetal growth, first trimester, fetus 2: Secondary | ICD-10-CM | POA: Diagnosis not present

## 2019-04-05 LAB — CBC
HCT: 34 % — ABNORMAL LOW (ref 36.0–46.0)
Hemoglobin: 11.1 g/dL — ABNORMAL LOW (ref 12.0–15.0)
MCH: 30.9 pg (ref 26.0–34.0)
MCHC: 32.6 g/dL (ref 30.0–36.0)
MCV: 94.7 fL (ref 80.0–100.0)
Platelets: 229 10*3/uL (ref 150–400)
RBC: 3.59 MIL/uL — ABNORMAL LOW (ref 3.87–5.11)
RDW: 13.3 % (ref 11.5–15.5)
WBC: 5.4 10*3/uL (ref 4.0–10.5)
nRBC: 0 % (ref 0.0–0.2)

## 2019-04-05 LAB — ABO/RH: ABO/RH(D): AB POS

## 2019-04-05 LAB — TYPE AND SCREEN
ABO/RH(D): AB POS
Antibody Screen: NEGATIVE

## 2019-04-05 MED ORDER — ZOLPIDEM TARTRATE 5 MG PO TABS: 5.0000 mg | ORAL_TABLET | Freq: Every evening | ORAL | Status: DC | PRN

## 2019-04-05 MED ORDER — TERBUTALINE SULFATE 1 MG/ML IJ SOLN
0.2500 mg | Freq: Once | INTRAMUSCULAR | Status: AC
  Administered 2019-04-05: 0.25 mg via SUBCUTANEOUS
  Filled 2019-04-05: qty 1

## 2019-04-05 MED ORDER — LACTATED RINGERS IV SOLN
INTRAVENOUS | Status: DC
  Administered 2019-04-05 – 2019-04-06 (×3): via INTRAVENOUS

## 2019-04-05 MED ORDER — ZOLPIDEM TARTRATE 5 MG PO TABS
5.0000 mg | ORAL_TABLET | Freq: Every evening | ORAL | Status: DC | PRN
  Administered 2019-04-05: 5 mg via ORAL
  Filled 2019-04-05: qty 1

## 2019-04-05 MED ORDER — DOCUSATE SODIUM 100 MG PO CAPS
100.0000 mg | ORAL_CAPSULE | Freq: Every day | ORAL | Status: DC
  Administered 2019-04-06 – 2019-04-07 (×2): 100 mg via ORAL
  Filled 2019-04-05 (×2): qty 1

## 2019-04-05 MED ORDER — PRENATAL MULTIVITAMIN CH
1.0000 | ORAL_TABLET | Freq: Every day | ORAL | Status: DC
  Administered 2019-04-06 – 2019-04-08 (×3): 1 via ORAL
  Filled 2019-04-05 (×3): qty 1

## 2019-04-05 MED ORDER — BETAMETHASONE SOD PHOS & ACET 6 (3-3) MG/ML IJ SUSP
12.0000 mg | Freq: Once | INTRAMUSCULAR | Status: AC
  Administered 2019-04-05: 17:00:00 12 mg via INTRAMUSCULAR
  Filled 2019-04-05: qty 2

## 2019-04-05 MED ORDER — SOD CITRATE-CITRIC ACID 500-334 MG/5ML PO SOLN: 30.0000 mL | ORAL | Status: AC

## 2019-04-05 MED ORDER — LACTATED RINGERS IV SOLN: INTRAVENOUS | Status: DC

## 2019-04-05 MED ORDER — CALCIUM CARBONATE ANTACID 500 MG PO CHEW: 2.0000 | CHEWABLE_TABLET | ORAL | Status: DC | PRN

## 2019-04-05 MED ORDER — ACETAMINOPHEN 325 MG PO TABS
650.0000 mg | ORAL_TABLET | ORAL | Status: DC | PRN
  Administered 2019-04-06 – 2019-04-07 (×2): 650 mg via ORAL
  Filled 2019-04-05 (×2): qty 2

## 2019-04-05 NOTE — Consult Note (Addendum)
[facility] --  [facility] 04/05/2019    5:34 PM  Neonatal Medicine Consultation         JOELY LAHENS          MRN:  [id number]  I was called at the request of the patient's obstetrician (Dr. Earlene Plater) to speak to this patient due to possible preterm delivery of twins at 80 1/7 weeks.    The patient's prenatal course includes twins (both females), HSV2, smoking, late prenatal care, drug use (cocaine, THC), ? ASD for twin A, IUGR twin A (917g on 03/22/19), discordancy with twin B (955g on 03/22/19), and abnormal BPP of twin A (0/8 earlier today).  She is 30 1/7 weeks currently.  She is admitted to [facility], and is receiving treatment that includes a dose of betamethasone (also given doses on 4/2 and 4/3) and close monitoring.  I reviewed expectations for a baby born at 30 weeks and beyond, including survival, morbidities such as respiratory distress, infection, feeding intolerance.  I described how we provide respiratory and feeding support.  Mom plans to breast feed, which I encouraged as best for the babies.  I let mom know that their outlook generally improves the longer she remains undelivered.  I spent 30 minutes reviewing the record, speaking to the patient, and entering appropriate documentation.  More than 50% of the time was spent face to face with patient.   _____________________ Angelita Ingles, MD Neonatologist

## 2019-04-05 NOTE — MAU Note (Signed)
Went to prenatal visit at MFM for ultrasound and they sent me here.
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
## 2019-04-06 ENCOUNTER — Inpatient Hospital Stay (HOSPITAL_COMMUNITY): Payer: Medicaid Other

## 2019-04-06 ENCOUNTER — Encounter (HOSPITAL_COMMUNITY): Payer: Self-pay | Admitting: Obstetrics and Gynecology

## 2019-04-06 DIAGNOSIS — O30043 Twin pregnancy, dichorionic/diamniotic, third trimester: Secondary | ICD-10-CM

## 2019-04-06 DIAGNOSIS — O09213 Supervision of pregnancy with history of pre-term labor, third trimester: Secondary | ICD-10-CM

## 2019-04-06 DIAGNOSIS — O365912 Maternal care for other known or suspected poor fetal growth, first trimester, fetus 2: Secondary | ICD-10-CM

## 2019-04-06 DIAGNOSIS — O365931 Maternal care for other known or suspected poor fetal growth, third trimester, fetus 1: Secondary | ICD-10-CM

## 2019-04-06 DIAGNOSIS — O365911 Maternal care for other known or suspected poor fetal growth, first trimester, fetus 1: Secondary | ICD-10-CM

## 2019-04-06 DIAGNOSIS — O365910 Maternal care for other known or suspected poor fetal growth, first trimester, not applicable or unspecified: Secondary | ICD-10-CM

## 2019-04-06 DIAGNOSIS — O99323 Drug use complicating pregnancy, third trimester: Secondary | ICD-10-CM

## 2019-04-06 DIAGNOSIS — O283 Abnormal ultrasonic finding on antenatal screening of mother: Secondary | ICD-10-CM

## 2019-04-06 DIAGNOSIS — Z3A30 30 weeks gestation of pregnancy: Secondary | ICD-10-CM

## 2019-04-06 LAB — RPR: RPR Ser Ql: NONREACTIVE

## 2019-04-06 MED ORDER — LACTATED RINGERS IV BOLUS
1000.0000 mL | Freq: Once | INTRAVENOUS | Status: AC
  Administered 2019-04-06: 1000 mL via INTRAVENOUS

## 2019-04-06 MED ORDER — SODIUM CHLORIDE 0.9% FLUSH: 3.0000 mL | INTRAVENOUS | Status: DC | PRN

## 2019-04-06 MED ORDER — SODIUM CHLORIDE 0.9% FLUSH
3.0000 mL | Freq: Two times a day (BID) | INTRAVENOUS | Status: DC
  Administered 2019-04-06 – 2019-04-07 (×3): 3 mL via INTRAVENOUS

## 2019-04-06 MED ORDER — SODIUM CHLORIDE 0.9 % IV SOLN: 250.0000 mL | INTRAVENOUS | Status: DC | PRN

## 2019-04-06 MED ORDER — FENTANYL CITRATE (PF) 100 MCG/2ML IJ SOLN
50.0000 ug | Freq: Once | INTRAMUSCULAR | Status: AC
  Administered 2019-04-06: 04:00:00 50 ug via INTRAVENOUS
  Filled 2019-04-06: qty 2

## 2019-04-06 MED ORDER — BETAMETHASONE SOD PHOS & ACET 6 (3-3) MG/ML IJ SUSP
12.0000 mg | Freq: Once | INTRAMUSCULAR | Status: AC
  Administered 2019-04-06: 17:00:00 12 mg via INTRAMUSCULAR
  Filled 2019-04-06: qty 2

## 2019-04-06 NOTE — Progress Notes (Signed)
Patient refused to stay on fetal monitor any longer

## 2019-04-06 NOTE — Progress Notes (Signed)
Daily Antepartum Note  Admission Date: 04/05/2019 Current Date: 04/06/2019 10:28 AM  Sheryle Hailiamond Farris HasM Kupfer is a 33 y.o. [id number] @ 2472w2d, HD#2, admitted for abnormal dopplers for fetus A.  Pregnancy complicated by: Patient Active Problem List   Diagnosis Date Noted  . IUGR (intrauterine growth restriction) affecting care of mother 04/05/2019  . Fetus A ?ASD 02/28/2019  . Supervision of high risk pregnancy, antepartum 01/30/2019  . Dichorionic diamniotic twin pregnancy, antepartum 01/30/2019  . Anemia affecting pregnancy in third trimester 03/10/2015  . History of preterm delivery, currently pregnant 01/06/2015    Overnight/24hr events:  Reassuring bpp on both last night Had some UCs (no cx check needed) Subjective:  No s/s of labor or decreased fm  Objective:    Current Vital Signs 24h Vital Sign Ranges  T 98 F (36.7 C) Temp  Avg: 98.2 F (36.8 C)  Min: 97.8 F (36.6 C)  Max: 98.7 F (37.1 C)  BP 103/60 BP  Min: 103/60  Max: 127/73  HR 81 Pulse  Avg: 86  Min: 77  Max: 92  RR 16 Resp  Avg: 17.2  Min: 16  Max: 18  SaO2 100 % Room Air SpO2  Avg: 99.8 %  Min: 99 %  Max: 100 %       24 Hour I/O Current Shift I/O  Time Ins Outs No intake/output data recorded. No intake/output data recorded.   A: 125 baseline, +accels, no decel, mod variability B: 140 baseline, +accels, no decel, mod variability Toco: occasional UCs  Physical exam: General: Well nourished, well developed female in no acute distress. Abdomen: gravid nttp Cardiovascular: S1, S2 normal, no murmur, rub or gallop, regular rate and rhythm Respiratory: CTAB Extremities: no clubbing, cyanosis or edema Skin: Warm and dry.   Medications: Current Facility-Administered Medications  Medication Dose Route Frequency Provider Last Rate Last Dose  . 0.9 %  sodium chloride infusion  250 mL Intravenous PRN [place] BingPickens, Korinna Tat, MD      . acetaminophen (TYLENOL) tablet 650 mg  650 mg Oral Q4H PRN Conan Bowensavis, Kelly M, MD   650 mg at  04/06/19 1011  . calcium carbonate (TUMS - dosed in mg elemental calcium) chewable tablet 400 mg of elemental calcium  2 tablet Oral Q4H PRN Conan Bowensavis, Kelly M, MD      . docusate sodium (COLACE) capsule 100 mg  100 mg Oral Daily Conan Bowensavis, Kelly M, MD   100 mg at 04/06/19 0859  . prenatal multivitamin tablet 1 tablet  1 tablet Oral Q1200 Conan Bowensavis, Kelly M, MD   1 tablet at 04/06/19 0900  . sodium chloride flush (NS) 0.9 % injection 3 mL  3 mL Intravenous Q12H [place] BingPickens, Zeba Luby, MD      . sodium chloride flush (NS) 0.9 % injection 3 mL  3 mL Intravenous PRN [place] BingPickens, Jeaninne Lodico, MD      . sodium citrate-citric acid (ORACIT) solution 30 mL  30 mL Oral On Call to OR Conan Bowensavis, Kelly M, MD      . zolpidem [facility](AMBIEN) tablet 5 mg  5 mg Oral QHS PRN Lazaro ArmsEure, Luther H, MD   5 mg at 04/05/19 2301    Labs:  Recent Labs  Lab 04/05/19 1546  WBC 5.4  HGB 11.1*  HCT 34.0*  PLT 229    No results for input(s): NA, K, CL, CO2, BUN, CREATININE, CALCIUM, PROT, BILITOT, ALKPHOS, ALT, AST, GLUCOSE in the last 168 hours.  Invalid input(s): LABALBU   Radiology:  4/25 @ 2200 A: ceph, afi 1.98,  bpp 6/8 B: ceph, bpp 8/8 4/25 @ 1500 A: afi 1.8, bpp 0/8, cardiomegaly, pericardial effusion, normal s/d B: bpp 8/8, elevated s/d 4/10 A: afi 1.6, 717g, <10%, nl s/d B: 955gm, efw 22%, ac 9%, nl SD  Assessment & Plan:  Pt stable *Pregnancy: qshift NSTs, and qday bpps for now. Can d/w mfm on Monday re: plan of care if reassuring testing over the weekend. Desires BTL if for c/s *Di-Di: twins: d/w her re: when would recommend c/s vs trial of labor. Pt amenable to whatever is safest for babies *Preterm: rescue bmz #2 today at 1645. S/p NICU consult *?ASD in fetus A: pt never went to fetal echo appts. Spoke with Dr. Blenda Bridegroom this morning and if needs delivery, no reason can delivery here and assess neonate cardiac status then *PPx: SCDs *FEN/GI: regular diet, sliv *Dispo: see above  Cornelia Copa MD Attending Center for  [facility])

## 2019-04-07 ENCOUNTER — Encounter (HOSPITAL_COMMUNITY): Payer: Self-pay | Admitting: *Deleted

## 2019-04-07 NOTE — Progress Notes (Signed)
Daily Antepartum Note  Admission Date: 04/05/2019 Current Date: 04/07/2019 7:18 AM  Sheryle Hailiamond Farris HasM Varricchio is a 33 y.o. [id number] @ 7769w3d, HD#3, admitted for abnormal dopplers for fetus A.  Pregnancy complicated by: Patient Active Problem List   Diagnosis Date Noted  . IUGR (intrauterine growth restriction) affecting care of mother 04/05/2019  . Fetus A ?ASD 02/28/2019  . Supervision of high risk pregnancy, antepartum 01/30/2019  . Dichorionic diamniotic twin pregnancy, antepartum 01/30/2019  . Anemia affecting pregnancy in third trimester 03/10/2015  . History of preterm delivery, currently pregnant 01/06/2015    Overnight/24hr events:  None  Subjective:  No s/s of labor or decreased fm  Objective:    Current Vital Signs 24h Vital Sign Ranges  T 98.5 F (36.9 C) Temp  Avg: 98.3 F (36.8 C)  Min: 98 F (36.7 C)  Max: 98.7 F (37.1 C)  BP 115/76 BP  Min: 103/60  Max: 124/71  HR 96 Pulse  Avg: 88.2  Min: 81  Max: 96  RR 19 Resp  Avg: 17.3  Min: 16  Max: 19  SaO2 100 % Room Air SpO2  Avg: 99.8 %  Min: 98 %  Max: 100 %       24 Hour I/O Current Shift I/O  Time Ins Outs No intake/output data recorded. No intake/output data recorded.   A: 140 baseline, +accels, no decel, mod variability B: 135 baseline, +accels, no decel, mod variability Toco: occasional UCs  Physical exam: General: Well nourished, well developed female in no acute distress. Abdomen: gravid nttp Cardiovascular: S1, S2 normal, no murmur, rub or gallop, regular rate and rhythm Respiratory: CTAB Extremities: no clubbing, cyanosis or edema Skin: Warm and dry.   Medications: Current Facility-Administered Medications  Medication Dose Route Frequency Provider Last Rate Last Dose  . 0.9 %  sodium chloride infusion  250 mL Intravenous PRN [place] BingPickens, Okie Jansson, MD      . acetaminophen (TYLENOL) tablet 650 mg  650 mg Oral Q4H PRN Conan Bowensavis, Kelly M, MD   650 mg at 04/06/19 1011  . calcium carbonate (TUMS - dosed in mg  elemental calcium) chewable tablet 400 mg of elemental calcium  2 tablet Oral Q4H PRN Conan Bowensavis, Kelly M, MD      . docusate sodium (COLACE) capsule 100 mg  100 mg Oral Daily Conan Bowensavis, Kelly M, MD   100 mg at 04/06/19 0859  . prenatal multivitamin tablet 1 tablet  1 tablet Oral Q1200 Conan Bowensavis, Kelly M, MD   1 tablet at 04/06/19 0900  . sodium chloride flush (NS) 0.9 % injection 3 mL  3 mL Intravenous Q12H [place] BingPickens, Amerika Nourse, MD   3 mL at 04/06/19 2133  . sodium chloride flush (NS) 0.9 % injection 3 mL  3 mL Intravenous PRN [place] BingPickens, [place]ou[place] Ackert, MD      . zolpidem (AMBIEN) tablet 5 mg  5 mg Oral QHS PRN Lazaro ArmsEure, Luther H, MD   5 mg at 04/05/19 2301    Labs:  Recent Labs  Lab 04/05/19 1546  WBC 5.4  HGB 11.1*  HCT 34.0*  PLT 229    No results for input(s): NA, K, CL, CO2, BUN, CREATININE, CALCIUM, PROT, BILITOT, ALKPHOS, ALT, AST, GLUCOSE in the last 168 hours.  Invalid input(s): LABALBU   Radiology:  4/25 BPP 8/8 for both fetuses 4/24 @ 2200 A: ceph, afi 1.98, bpp 6/8 B: ceph, bpp 8/8 4/24 @ 1500 A: afi 1.8, bpp 0/8, cardiomegaly, pericardial effusion, normal s/d B: bpp 8/8, elevated s/d 4/10 A: afi  1.6, 717g, <10%, nl s/d B: 955gm, efw 22%, ac 9%, nl SD  Assessment & Plan:  Pt stable *Pregnancy: qshift NSTs. Can try and d/w mfm today about possible d/c to home today with close outpatient follow up. Desires BTL if for c/s *Di-Di: twins: d/w her re: when would recommend c/s vs trial of labor. Pt amenable to whatever is safest for babies. Currently would need c/s given variable presentation for A. *Preterm: s/p rescue bmz on 4/24 and 25. S/p NICU consult *?ASD in fetus A: pt never went to fetal echo appts. Spoke with Dr. Blenda Bridegroom on 4/25 and if needs delivery, no reason can delivery here and assess neonate cardiac status then *PPx: SCDs *FEN/GI: regular diet, sliv *Dispo: see above  Cornelia Copa MD Attending Center for [facility])

## 2019-04-08 ENCOUNTER — Inpatient Hospital Stay (HOSPITAL_COMMUNITY): Payer: Medicaid Other

## 2019-04-08 DIAGNOSIS — Z3A30 30 weeks gestation of pregnancy: Secondary | ICD-10-CM

## 2019-04-08 DIAGNOSIS — O30043 Twin pregnancy, dichorionic/diamniotic, third trimester: Secondary | ICD-10-CM

## 2019-04-08 DIAGNOSIS — O365931 Maternal care for other known or suspected poor fetal growth, third trimester, fetus 1: Secondary | ICD-10-CM

## 2019-04-08 DIAGNOSIS — O09213 Supervision of pregnancy with history of pre-term labor, third trimester: Secondary | ICD-10-CM

## 2019-04-08 DIAGNOSIS — O365932 Maternal care for other known or suspected poor fetal growth, third trimester, fetus 2: Secondary | ICD-10-CM

## 2019-04-08 DIAGNOSIS — O99323 Drug use complicating pregnancy, third trimester: Secondary | ICD-10-CM

## 2019-04-08 NOTE — Progress Notes (Signed)
Patient refused further fetal monitoring due to backache and length of fetal monitoring earlier in the day. Fetal tracing of less than 20 minutes obtained. Pt provided heat packs and acetaminophen for backache.

## 2019-04-08 NOTE — Discharge Summary (Signed)
OB Discharge Summary     Patient Name: Christy West DOB: [birth year] MRN: [id number]  Date of admission: 04/05/2019 Delivering MD: This patient has no babies on file.  Date of discharge: 04/08/2019  Admitting diagnosis: DFM Baby A Intrauterine pregnancy: [id number]     Secondary diagnosis:  Active Problems:   Fetus A ?ASD   IUGR (intrauterine growth restriction) affecting care of mother  Additional problems: Di-Di twin pregnancy     Discharge diagnosis: preterm di-di twin pregnancy with FGR x 2                                                                                                Hospital course:  Patient admitted for observation due to abnormal fetal testing on 4/24. BPP for twin A was 0/8. Since then BPP significantly improved 8/8x 2 on 2 subsequent days and today 6/8 for twin A and 8/8 twin B. Patient is otherwise stable and without other complaints. Case reviewed with MFM Dr. Grace BushyBooker who agrees that patient is stable for discharge and to continue close outpatient monitoring. Follow up for BPP with dopplers and ROB on 04/12/2019. Patient scheduled for fetal echo on 04/11/19  Physical exam  Vitals:   04/07/19 1955 04/07/19 2328 04/08/19 0835 04/08/19 1140  BP: 109/61 123/67 117/66 127/69  Pulse: 81 97 70 90  Resp:  17 18 18   Temp:  98.4 F (36.9 C) 98.1 F (36.7 C) 98.4 F (36.9 C)  TempSrc:  Oral Oral Oral  SpO2:  100% 99% 100%  Weight:      Height:       General: alert, cooperative and no distress Lungs: clear to ausculatation bilaterally Chest: nl S1 S2 Abdomen: soft, gravid, non tender DVT Evaluation: No evidence of DVT seen on physical exam.  FHT: A: baseline 130, mod variability +accels, no decels          B: baseline 140, mod variability, + accels, no decels  Labs: Lab Results  Component Value Date   WBC 5.4 04/05/2019   HGB 11.1 (L) 04/05/2019   HCT 34.0 (L) 04/05/2019   MCV 94.7 04/05/2019   PLT 229 04/05/2019   CMP Latest Ref Rng & Units  01/26/2018  Glucose 65 - 99 mg/dL 99  BUN 6 - 20 mg/dL 5(L)  Creatinine 4.090.44 - 1.00 mg/dL 8.110.89  Sodium 914135 - 782145 mmol/L 140  Potassium 3.5 - 5.1 mmol/L 3.6  Chloride 101 - 111 mmol/L 107  CO2 22 - 32 mmol/L 21(L)  Calcium 8.9 - 10.3 mg/dL 9.3  Total Protein 6.5 - 8.1 g/dL 9.5(A5.7(L)  Total Bilirubin 0.3 - 1.2 mg/dL 0.4  Alkaline Phos 38 - 126 U/L 47  AST 15 - 41 U/L 174(H)  ALT 14 - 54 U/L 94(H)    Discharge instruction: per After Visit Summary and "Baby and Me Booklet".  After visit meds:  Allergies as of 04/08/2019   No Known Allergies     Medication List    STOP taking these medications   metroNIDAZOLE 500 MG tablet Commonly known as:  FLAGYL   progesterone 200 MG capsule  Commonly known as:  PROMETRIUM     TAKE these medications   Comfort Fit Maternity Supp Med Misc Use belt daily What changed:  Another medication with the same name was removed. Continue taking this medication, and follow the directions you see here.   folic acid 1 MG tablet Commonly known as:  FOLVITE Take 1 tablet (1 mg total) by mouth daily.   PrePLUS 27-1 MG Tabs Take 1 tablet by mouth daily.       Diet: routine diet  Activity: Advance as tolerated. Pelvic rest for 6 weeks.   Outpatient follow up:  Follow up Appt: Future Appointments  Date Time Provider Department Center  04/12/2019 11:15 AM Hermina Staggers, MD [facility]  04/12/2019 12:30 PM [facility] NURSE [facility]  04/12/2019 12:30 PM [facility] [place] [facility]    04/08/2019 Catalina Antigua, MD

## 2019-04-10 ENCOUNTER — Telehealth: Payer: Self-pay | Admitting: Medical

## 2019-04-10 NOTE — Telephone Encounter (Signed)
Called the patient to inform of upcoming appointment on Friday and the new location. The patient verbalized understanding.

## 2019-04-11 ENCOUNTER — Inpatient Hospital Stay (HOSPITAL_BASED_OUTPATIENT_CLINIC_OR_DEPARTMENT_OTHER): Payer: Medicaid Other

## 2019-04-11 ENCOUNTER — Inpatient Hospital Stay (HOSPITAL_COMMUNITY)
Admission: AD | Admit: 2019-04-11 | Discharge: 2019-04-11 | Disposition: A | Discharge status: Home / Self Care | Payer: Medicaid Other | Attending: Obstetrics & Gynecology | Admitting: Obstetrics & Gynecology

## 2019-04-11 ENCOUNTER — Encounter (HOSPITAL_COMMUNITY): Payer: Self-pay

## 2019-04-11 ENCOUNTER — Encounter (HOSPITAL_COMMUNITY): Payer: Self-pay | Admitting: Obstetrics and Gynecology

## 2019-04-11 ENCOUNTER — Other Ambulatory Visit: Payer: Self-pay

## 2019-04-11 DIAGNOSIS — O30043 Twin pregnancy, dichorionic/diamniotic, third trimester: Secondary | ICD-10-CM | POA: Diagnosis not present

## 2019-04-11 DIAGNOSIS — Z3A31 31 weeks gestation of pregnancy: Secondary | ICD-10-CM

## 2019-04-11 DIAGNOSIS — O99323 Drug use complicating pregnancy, third trimester: Secondary | ICD-10-CM

## 2019-04-11 DIAGNOSIS — F17210 Nicotine dependence, cigarettes, uncomplicated: Secondary | ICD-10-CM | POA: Diagnosis not present

## 2019-04-11 DIAGNOSIS — O288 Other abnormal findings on antenatal screening of mother: Secondary | ICD-10-CM

## 2019-04-11 DIAGNOSIS — Z0371 Encounter for suspected problem with amniotic cavity and membrane ruled out: Secondary | ICD-10-CM | POA: Diagnosis present

## 2019-04-11 DIAGNOSIS — Z833 Family history of diabetes mellitus: Secondary | ICD-10-CM | POA: Insufficient documentation

## 2019-04-11 DIAGNOSIS — O09213 Supervision of pregnancy with history of pre-term labor, third trimester: Secondary | ICD-10-CM | POA: Diagnosis not present

## 2019-04-11 DIAGNOSIS — O99333 Smoking (tobacco) complicating pregnancy, third trimester: Secondary | ICD-10-CM | POA: Diagnosis not present

## 2019-04-11 DIAGNOSIS — O365931 Maternal care for other known or suspected poor fetal growth, third trimester, fetus 1: Secondary | ICD-10-CM

## 2019-04-11 LAB — POCT FERN TEST: POCT Fern Test: NEGATIVE

## 2019-04-11 LAB — AMNISURE RUPTURE OF MEMBRANE (ROM) NOT AT ARMC: Amnisure ROM: NEGATIVE

## 2019-04-11 NOTE — MAU Note (Signed)
Pt reports she went to the BR and after she got up the floor was wet.. Thinks her water broke. About 15 min ago. Good fetal movement felt denies any pain or contractions at this time.

## 2019-04-11 NOTE — Discharge Instructions (Signed)
Premature Rupture and Preterm Premature Rupture of Membranes  A sac made up of membranes surrounds your baby in the womb (uterus). Rupture of membranes is when this sac breaks open. This is also known as your "water breaking." When this sac breaks before labor starts, it is called premature rupture of membranes (PROM). If this happens before 37 weeks of being pregnant, it is called preterm premature rupture of membranes (PPROM). PPROM is serious. It needs medical care right away. What increases the risk of PPROM? PPROM is more likely to happen in women who:  Have an infection.  Have had PPROM before.  Have a cervix that is short.  Have bleeding during the second or third trimester.  Have a low BMI. This is a measure of body fat.  Smoke.  Use drugs.  Have a low socioeconomic status. What problems can be caused by PROM and PPROM? This condition creates health dangers for the mother and the baby. These include:  Giving birth to the baby too early (prematurely).  Getting a serious infection of the placenta (chorioamnionitis).  Having the placenta detach from the uterus early (placental abruption).  Squeezing of the umbilical cord.  Getting a serious infection after delivery. What are the signs of PROM and PPROM?  A sudden gush of fluid from the vagina.  A slow leak of fluid from the vagina.  Your underwear is wet. What should I do if I think my water broke? Call your doctor right away. You will need to go to the hospital to get checked right away. What happens if I am told that I have PROM or PPROM? You will have tests done at the hospital.  If you have PROM, you may be given medicine to start labor (be induced). This may be done if you are not having contractions during the 24 hours after your water broke.  If you have PPROM and are not having contractions, you may be given medicine to start labor. It will depend on how far along you are in your pregnancy. If you have  PPROM:  You and your baby will be watched closely to see if you have infections or other problems.  You may be given: ? An antibiotic medicine. This can stop an infection from starting. ? A steroid medicine. This can help your baby's lungs develop faster. ? A medicine to help prevent cerebral palsy in your baby. ? A medicine to stop early labor (preterm labor).  You may be told to stay in bed except to use the bathroom (bed rest).  You may be given medicine to start labor. This may be done if there are problems with you or the baby. Your treatment will depend on many factors. Contact a doctor if:  Your water breaks and you are not having contractions. Get help right away if:  Your water breaks before you are [redacted] weeks pregnant. Summary  When your water breaks before labor starts, it is called premature rupture of membranes (PROM).  When PROM happens before 37 weeks of pregnancy, it is called preterm premature rupture of membranes (PPROM).  If you are not having contractions, your labor may be started for you. This information is not intended to replace advice given to you by your health care provider. Make sure you discuss any questions you have with your health care provider. Document Released: 02/24/2009 Document Revised: 07/14/2017 Document Reviewed: 08/18/2016 Elsevier Interactive Patient Education  2019 [employer] Fetal Movement Counts Patient Name: ________________________________________________ Patient Due Date: ____________________  What is a fetal movement count?  A fetal movement count is the number of times that you feel your baby move during a certain amount of time. This may also be called a fetal kick count. A fetal movement count is recommended for every pregnant woman. You may be asked to start counting fetal movements as early as week 28 of your pregnancy. Pay attention to when your baby is most active. You may notice your baby's sleep and wake cycles. You may  also notice things that make your baby move more. You should do a fetal movement count:  When your baby is normally most active.  At the same time each day. A good time to count movements is while you are resting, after having something to eat and drink. How do I count fetal movements? 1. Find a quiet, comfortable area. Sit, or lie down on your side. 2. Write down the date, the start time and stop time, and the number of movements that you felt between those two times. Take this information with you to your health care visits. 3. For 2 hours, count kicks, flutters, swishes, rolls, and jabs. You should feel at least 10 movements during 2 hours. 4. You may stop counting after you have felt 10 movements. 5. If you do not feel 10 movements in 2 hours, have something to eat and drink. Then, keep resting and counting for 1 hour. If you feel at least 4 movements during that hour, you may stop counting. Contact a health care provider if:  You feel fewer than 4 movements in 2 hours.  Your baby is not moving like he or she usually does. Date: ____________ Start time: ____________ Stop time: ____________ Movements: ____________ Date: ____________ Start time: ____________ Stop time: ____________ Movements: ____________ Date: ____________ Start time: ____________ Stop time: ____________ Movements: ____________ Date: ____________ Start time: ____________ Stop time: ____________ Movements: ____________ Date: ____________ Start time: ____________ Stop time: ____________ Movements: ____________ Date: ____________ Start time: ____________ Stop time: ____________ Movements: ____________ Date: ____________ Start time: ____________ Stop time: ____________ Movements: ____________ Date: ____________ Start time: ____________ Stop time: ____________ Movements: ____________ Date: ____________ Start time: ____________ Stop time: ____________ Movements: ____________ This information is not intended to replace advice  given to you by your health care provider. Make sure you discuss any questions you have with your health care provider. Document Released: 12/28/2006 Document Revised: 07/27/2016 Document Reviewed: 01/07/2016 Else[employer] Interactive Patient Education  2019 [employer]

## 2019-04-11 NOTE — MAU Provider Note (Signed)
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   06/13/19                                      (12/23/18) ---------------------------------------------------------------------- Doppler - Fetal Vessels (Fetus B)  Umbilical Artery   S/D     %tile     RI              PI  4.15       95   0.76             1.26 ---------------------------------------------------------------------- Impression  Biophysical profile 6/8 for A- improved amniotic fluid volume  and umbilical vein varix  Biophysical profile 8/8 B ---------------------------------------------------------------------- Recommendations  Mangement per inpatient team  Please reiterate pediatric echocardiogram appointment on  04/11/19. ----------------------------------------------------------------------                Sander Nephew, MD Electronically Signed Final Report   04/08/2019 10:33 am ----------------------------------------------------------------------  [place] Mfm Fetal Bpp Wo Non Stress  Result Date: 04/05/2019 ----------------------------------------------------------------------  OBSTETRICS REPORT                       (Signed Final 04/05/2019 03:28 pm) ---------------------------------------------------------------------- Patient Info  ID #:       [id number]                          D.O.B.:  1985/12/16 (32 yrs)  Name:       Rosanna Randy                 Visit Date: 04/05/2019 01:20 pm ---------------------------------------------------------------------- Performed By  Performed By:     Novella Rob        Ref. Address:     Faculty                    RDMS  Attending:        Sander Nephew      [facility]                    MD                                       [facility]etal [facility]  Referred By:      Vickii Chafe  CONSTANT MD ---------------------------------------------------------------------- Orders   #  Description                          Code         Ordered By   1  [place] MFM UA CORD DOPPLER               76820.02     [place]   2  [place] MFM UA DOPPLER ADDL               76820.03     [place]      GEST RE EVAL                                      [place]   3  [place] MFM FETAL BPP WO NON              76819.01     [place]   4  [place] MFM FETAL BPP WO NST              92010.0      [place]      ADDL GESTATION                                    BOOKER  ----------------------------------------------------------------------   #  Order #                    Accession #                 Episode #   1  [id number]                  [id number]                  [id number]   2  [id number]                  [id number]                  [id number]   3  [id number]                   [id number]                  [id number]   4  [id number]                  [id number]                  [id number]  ---------------------------------------------------------------------- Indications   Drug use complicating pregnancy, 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)   Poor obstetric history: Previous  preterm       O09.219   delivery, antepartum (x2 44w1d36wks)   Twin pregnancy, di/di, third trimester         O30.043   Maternal care for known or suspected poor      O36.5931   fetal growth, third trimester, fetus 1   [redacted] weeks gestation of pregnancy                Z3A.30  ---------------------------------------------------------------------- Vital Signs  Weight (lb): 172                               Height:        5'6"  BMI:         27.76 ---------------------------------------------------------------------- Fetal Evaluation (Fetus A)  Num Of Fetuses:         2  Fetal Heart Rate(bpm):  117  Cardiac Activity:       Bradycardia  Fetal Lie:              Lower Fetus  Presentation:           Cephalic  Placenta:               Posterior  Amniotic Fluid  AFI FV:      Oligohydramnios                              Largest Pocket(cm)                              1.78 ---------------------------------------------------------------------- Biophysical Evaluation (Fetus A)  Amniotic F.V:   Oligohydramnios            F. Tone:        Not Observed  F. Movement:    Not Observed               Score:          0/8  F. Breathing:   Not Observed ---------------------------------------------------------------------- Gestational Age (Fetus A)  LMP:           33w 5d        Date:  08/12/18                 EDD:   05/19/19  Best:          30w 1d     Det. By:  Previous Ultrasound      EDD:   06/13/19                                      (12/23/18) ---------------------------------------------------------------------- Anatomy (Fetus A)  Thoracic:              Pericardial            Stomach:                Appears normal, left                          effusion  sided  Heart:                 Cardiomegaly           Bladder:                Appears normal ---------------------------------------------------------------------- Doppler - Fetal Vessels (Fetus A)  Umbilical Artery   S/D     %tile  3.14       64 ---------------------------------------------------------------------- Fetal Evaluation (Fetus B)  Num Of Fetuses:         2  Fetal Heart Rate(bpm):  144  Cardiac Activity:       Observed  Fetal Lie:              Upper Fetus  Presentation:           Cephalic  Placenta:               Anterior  Amniotic Fluid  AFI FV:      Within normal limits                              Largest Pocket(cm)                              4.94 ---------------------------------------------------------------------- Biophysical Evaluation (Fetus B)  Amniotic F.V:   Within normal limits       F. Tone:        Observed  F. Movement:    Observed                   Score:          8/8  F. Breathing:   Observed ---------------------------------------------------------------------- Gestational Age (Fetus B)  LMP:           33w 5d        Date:  08/12/18                 EDD:   05/19/19  Best:          30w 1d     Det. By:  Previous Ultrasound      EDD:   06/13/19                                      (12/23/18) ---------------------------------------------------------------------- Anatomy (Fetus B)  Stomach:               Appears normal, left   Bladder:                Appears normal                         sided ---------------------------------------------------------------------- Doppler - Fetal Vessels (Fetus B)  Umbilical Artery   S/D     %tile  6.03    > 97.5 ---------------------------------------------------------------------- Cervix Uterus Adnexa  Cervix  Not visualized (advanced GA >24wks) ---------------------------------------------------------------------- Comments  I met with Ms. Carvey to discuss today's  finding of a BPP of  0/8 for Twin A with elevated Dopplers in Twin B. Twin A has  low amniotic fluid , no movement, tone or breathing. We  discussed that a BPP can be inaccuarate at this gestational  and that further evaluation and monitoring should happe  immediately. Secondly, we discussed the benefits and risks  of preterm  delivery to avoid perinatal mortality in Twin A but  the associated preterm complications on otherwise healthy  Twin B. At this time I recommended further monitoring on  L&D with NICU consultation. Ms. Dula expressed a desire  for cesarean delivery if there is any risk to Twin A. She feels  comfortable with the possible risk to Twin B.  I discussed this case with Dr. Rosana Hoes. ---------------------------------------------------------------------- Impression  Twin A IUGR with BPP 0/8  Twin B elevated UA Dopplers  Diamniotic Twin pregnancy. ---------------------------------------------------------------------- Recommendations  To L&D for admission possible delivery. ----------------------------------------------------------------------               Sander Nephew, MD Electronically Signed Final Report   04/05/2019 03:28 pm ----------------------------------------------------------------------  [place] Mfm Ob Follow Up  Result Date: 03/22/2019 ----------------------------------------------------------------------  OBSTETRICS REPORT                       (Signed Final 03/22/2019 12:23 pm) ---------------------------------------------------------------------- Patient Info  ID #:       [id number]                          D.O.B.:  06-10-1986 (32 yrs)  Name:       Rosanna Randy                 Visit Date: 03/22/2019 11:16 am ---------------------------------------------------------------------- Performed By  Performed By:     Hubert Azure          Ref. Address:     Faculty                    RDMS  Attending:        Sander Nephew      Location[facility]                    MD                                        Fetal Care  Referred By:      Mora Bellman MD ---------------------------------------------------------------------- Orders   #  Description                          Code         Ordered By   1  [place] MFM OB FOLLOW UP                  89169.45     RAVI SHANKAR   2  [place] MFM OB FOLLOW UP ADDL             03888.28     RAVI SHANKAR      GEST   3  [place] MFM UA CORD DOPPLER               76820.02     RAVI SHANKAR   4  [place] MFM UA ADDL GEST                  76820.01     RAVI SHANKAR  ----------------------------------------------------------------------   #  Order #  Accession #                 Episode #   1  [id number]                  [id number]                  [id number]   2  [id number]                  [id number]                  [id number]   3  [id number]                  [id number]                  [id number]   4  [id number]                  [id number]                  [id number]  ---------------------------------------------------------------------- Indications   Drug use complicating pregnancy, 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)   Poor obstetric history: Previous preterm       O09.219   delivery, antepartum (x2 20w1d36wks)   Twin pregnancy, di/di, third trimester         O30.043   Maternal care for known or suspected poor      O36.5931   fetal growth, third trimester, fetus 1   [redacted] weeks gestation of pregnancy                Z3A.28  ---------------------------------------------------------------------- Vital Signs                                                 Height:        5'6" ---------------------------------------------------------------------- Fetal Evaluation (Fetus A)  Num Of Fetuses:         2  Fetal Heart Rate(bpm):  129  Cardiac Activity:       Observed  Fetal Lie:              Lower right Fetus  Presentation:           Transverse, head to maternal right  Placenta:               Posterior  Amniotic Fluid  AFI FV:       Oligohydramnios                              Largest Pocket(cm)                              1.66 ---------------------------------------------------------------------- Biometry (Fetus A)  BPD:      60.7  mm     G. Age:  24w 5d          0  %    CI:        74.22   %    70 - 86  FL/HC:      19.3   %    18.8 - 20.6  HC:      223.7  mm     G. Age:  24w 3d        < 3  %    HC/AC:      1.09        1.05 - 1.21  AC:      204.6  mm     G. Age:  25w 0d        < 3  %    FL/BPD:     71.0   %    71 - 87  FL:       43.1  mm     G. Age:  24w 1d        < 3  %    FL/AC:      21.1   %    20 - 24  HUM:      40.1  mm     G. Age:  24w 3d        < 5  %  LV:        2.2  mm  Est. FW:     717  gm      1 lb 9 oz   < 10  %     FW Discordancy        25  % ---------------------------------------------------------------------- Gestational Age (Fetus A)  LMP:           31w 5d        Date:  08/12/18                 EDD:   05/19/19  U/S Today:     24w 4d                                        EDD:   07/08/19  Best:          28w 1d     Det. By:  Previous Ultrasound      EDD:   06/13/19                                      (12/23/18) ---------------------------------------------------------------------- Anatomy (Fetus A)  Cranium:               Appears normal         Aortic Arch:            Not well visualized  Cavum:                 Appears normal         Ductal Arch:            Not well visualized  Ventricles:            Previously seen        Diaphragm:              Previously seen  Choroid Plexus:        Previously seen        Stomach:                Appears normal, left  sided  Cerebellum:            Previously seen        Abdomen:                Appears normal  Posterior Fossa:       Previously seen        Abdominal Wall:         Previously seen  Nuchal Fold:           Not applicable (>86    Cord Vessels:            Previously seen                         wks GA)  Face:                  Appears normal         Kidneys:                Previously seen                         (orbits and profile)  Lips:                  Previously seen        Bladder:                Appears normal  Heart:                 See comments           Spine:                  Previously seen  RVOT:                  Previously seen        Upper Extremities:      Previously seen  LVOT:                  Previously seen        Lower Extremities:      Previously seen  Other:  Nasal bone visualized. Technically difficult due to fetal position. ---------------------------------------------------------------------- Doppler - Fetal Vessels (Fetus A)  Umbilical Artery   S/D     %tile     RI              PI                     ADFV    RDFV     2         4    0.5            0.67                        No      No ---------------------------------------------------------------------- Fetal Evaluation (Fetus B)  Num Of Fetuses:         2  Fetal Heart Rate(bpm):  158  Cardiac Activity:       Observed  Fetal Lie:              Upper left Fetus  Presentation:           Transverse, head to maternal left  Placenta:               Anterior  Amniotic Fluid  AFI FV:      Within normal limits                              Largest Pocket(cm)                              5.34 ---------------------------------------------------------------------- Biometry (Fetus B)  BPD:      64.8  mm     G. Age:  26w 2d          2  %    CI:        68.34   %    70 - 86                                                          FL/HC:      19.3   %    18.8 - 20.6  HC:      250.6  mm     G. Age:  27w 2d          5  %    HC/AC:      1.12        1.05 - 1.21  AC:      223.3  mm     G. Age:  26w 5d          9  %    FL/BPD:     74.7   %    71 - 87  FL:       48.4  mm     G. Age:  26w 2d        < 3  %    FL/AC:      21.7   %    20 - 24  HUM:      44.5  mm     G. Age:  26w 3d          7  %  Est. FW:     955  gm       2 lb 2 oz     22  %     FW Discordancy     0 \ 25 % ---------------------------------------------------------------------- Gestational Age (Fetus B)  LMP:           31w 5d        Date:  08/12/18                 EDD:   05/19/19  U/S Today:     26w 5d                                        EDD:   06/23/19  Best:          28w 1d     Det. By:  Previous Ultrasound      EDD:   06/13/19                                      (12/23/18) ---------------------------------------------------------------------- Doppler - Fetal Vessels (Fetus B)  Umbilical Artery   S/D     %tile     RI              PI                     ADFV    RDFV  4.14       92   0.76             1.33                        No      No ---------------------------------------------------------------------- Impression  Diamniotic Dichorionic Twin pregnancy  Suspected ASD  IUGR Twin A  UA Dopplers within normal limits for Twin A and B  Fetal echo pending ---------------------------------------------------------------------- Recommendations  Continue weekly UA Dopplers assessment  Initiate BPP in 2 weeks  Repeat growth in 4 weeks. ----------------------------------------------------------------------               Sander Nephew, MD Electronically Signed Final Report   03/22/2019 12:23 pm ----------------------------------------------------------------------  [place] Mfm Ob Follow Up Addl Gest  Result Date: 03/22/2019 ----------------------------------------------------------------------  OBSTETRICS REPORT                       (Signed Final 03/22/2019 12:23 pm) ---------------------------------------------------------------------- Patient Info  ID #:       [id number]                          D.O.B.:  08/12/1986 (32 yrs)  Name:       Rosanna Randy                 Visit Date: 03/22/2019 11:16 am ---------------------------------------------------------------------- Performed By  Performed By:     Hubert Azure          Ref. Address:     Faculty                     RDMS  Attending:        Sander Nephew      [facility]                    MD                                       Fetal Care  Referred By:      Vickii Chafe                    CONSTANT MD ---------------------------------------------------------------------- Orders   #  Description                          Code         Ordered By   1  [place] MFM OB FOLLOW UP                  88828.00     RAVI SHANKAR   2  [place] MFM OB FOLLOW UP ADDL             34917.91     RAVI SHANKAR      GEST   3  [place] MFM UA CORD DOPPLER  76820.02     RAVI SHANKAR   4  [place] MFM UA ADDL GEST                  76820.01     RAVI SHANKAR  ----------------------------------------------------------------------   #  Order #                    Accession #                 Episode #   1  [id number]                  [id number]                  [id number]   2  [id number]                  [id number]                  [id number]   3  [id number]                  [id number]                  [id number]   4  [id number]                  [id number]                  [id number]  ---------------------------------------------------------------------- Indications   Drug use complicating pregnancy, 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)   Poor obstetric history: Previous preterm       O09.219   delivery, antepartum (x2 77w1d36wks)   Twin pregnancy, di/di, third trimester         O30.043   Maternal care for known or suspected poor      O36.5931   fetal growth, third trimester, fetus 1   [redacted] weeks gestation of pregnancy                Z3A.28  ---------------------------------------------------------------------- Vital Signs                                                 Height:        5'6" ---------------------------------------------------------------------- Fetal Evaluation (Fetus A)  Num Of Fetuses:         2  Fetal Heart Rate(bpm):  129  Cardiac Activity:       Observed  Fetal Lie:              Lower right Fetus  Presentation:            Transverse, head to maternal right  Placenta:               Posterior  Amniotic Fluid  AFI FV:      Oligohydramnios                              Largest Pocket(cm)                              1.66 ---------------------------------------------------------------------- Biometry (Fetus A)  BPD:      60.7  mm     G. Age:  24w 5d          0  %    CI:        74.22   %    70 - 86                                                          FL/HC:      19.3   %    18.8 - 20.6  HC:      223.7  mm     G. Age:  24w 3d        < 3  %    HC/AC:      1.09        1.05 - 1.21  AC:      204.6  mm     G. Age:  25w 0d        < 3  %    FL/BPD:     71.0   %    71 - 87  FL:       43.1  mm     G. Age:  24w 1d        < 3  %    FL/AC:      21.1   %    20 - 24  HUM:      40.1  mm     G. Age:  24w 3d        < 5  %  LV:        2.2  mm  Est. FW:     717  gm      1 lb 9 oz   < 10  %     FW Discordancy        25  % ---------------------------------------------------------------------- Gestational Age (Fetus A)  LMP:           31w 5d        Date:  08/12/18                 EDD:   05/19/19  U/S Today:     24w 4d                                        EDD:   07/08/19  Best:          28w 1d     Det. By:  Previous Ultrasound      EDD:   06/13/19                                      (12/23/18) ---------------------------------------------------------------------- Anatomy (Fetus A)  Cranium:               Appears normal         Aortic Arch:            Not well visualized  Cavum:                 Appears normal         Ductal Arch:            Not well visualized  Ventricles:            Previously seen        Diaphragm:              Previously seen  Choroid Plexus:        Previously seen        Stomach:                Appears normal, left                                                                        sided  Cerebellum:            Previously seen        Abdomen:                Appears normal  Posterior Fossa:       Previously seen        Abdominal Wall:          Previously seen  Nuchal Fold:           Not applicable (>17    Cord Vessels:           Previously seen                         wks GA)  Face:                  Appears normal         Kidneys:                Previously seen                         (orbits and profile)  Lips:                  Previously seen        Bladder:                Appears normal  Heart:                 See comments           Spine:                  Previously seen  RVOT:                  Previously seen        Upper Extremities:      Previously seen  LVOT:                  Previously seen        Lower Extremities:      Previously seen  Other:  Nasal bone visualized. Technically difficult due to fetal position. ---------------------------------------------------------------------- Doppler - Fetal Vessels (Fetus A)  Umbilical Artery   S/D     %tile     RI              PI                     ADFV    RDFV     2  4    0.5            0.67                        No      No ---------------------------------------------------------------------- Fetal Evaluation (Fetus B)  Num Of Fetuses:         2  Fetal Heart Rate(bpm):  158  Cardiac Activity:       Observed  Fetal Lie:              Upper left Fetus  Presentation:           Transverse, head to maternal left  Placenta:               Anterior  Amniotic Fluid  AFI FV:      Within normal limits                              Largest Pocket(cm)                              5.34 ---------------------------------------------------------------------- Biometry (Fetus B)  BPD:      64.8  mm     G. Age:  26w 2d          2  %    CI:        68.34   %    70 - 86                                                          FL/HC:      19.3   %    18.8 - 20.6  HC:      250.6  mm     G. Age:  27w 2d          5  %    HC/AC:      1.12        1.05 - 1.21  AC:      223.3  mm     G. Age:  26w 5d          9  %    FL/BPD:     74.7   %    71 - 87  FL:       48.4  mm     G. Age:  26w 2d        < 3  %    FL/AC:      21.7    %    20 - 24  HUM:      44.5  mm     G. Age:  26w 3d          7  %  Est. FW:     955  gm      2 lb 2 oz     22  %     FW Discordancy     0 \ 25 % ---------------------------------------------------------------------- Gestational Age (Fetus B)  LMP:           31w 5d        Date:  08/12/18  EDD:   05/19/19  U/S Today:     26w 5d                                        EDD:   06/23/19  Best:          28w 1d     Det. By:  Previous Ultrasound      EDD:   06/13/19                                      (12/23/18) ---------------------------------------------------------------------- Doppler - Fetal Vessels (Fetus B)  Umbilical Artery   S/D     %tile     RI              PI                     ADFV    RDFV  4.14       92   0.76             1.33                        No      No ---------------------------------------------------------------------- Impression  Diamniotic Dichorionic Twin pregnancy  Suspected ASD  IUGR Twin A  UA Dopplers within normal limits for Twin A and B  Fetal echo pending ---------------------------------------------------------------------- Recommendations  Continue weekly UA Dopplers assessment  Initiate BPP in 2 weeks  Repeat growth in 4 weeks. ----------------------------------------------------------------------               Sander Nephew, MD Electronically Signed Final Report   03/22/2019 12:23 pm ----------------------------------------------------------------------  [place] Mfm Ua Addl Gest  Result Date: 04/08/2019 ----------------------------------------------------------------------  OBSTETRICS REPORT                       (Signed Final 04/08/2019 10:33 am) ---------------------------------------------------------------------- Patient Info  ID #:       [id number]                          D.O.B.:  14-Feb-1986 (32 yrs)  Name:       Rosanna Randy                 Visit Date: 04/08/2019 09:21 am ---------------------------------------------------------------------- Performed By   Performed By:     Hubert Azure          Ref. Address:     Faculty                    RDMS  Attending:        Sander Nephew      [facility]                                       [place]  Referred By:      Hardin  MD ---------------------------------------------------------------------- Orders   #  Description                          Code         Ordered By   1  [place] MFM FETAL BPP WO NON              76819.01     [place]   2  [place] MFM FETAL BPP WO NST              65537.4      [place]      ADDL GESTATION   3  [place] MFM UA ADDL GEST                  76820.01     PEGGY CONSTANT   4  [place] MFM UA CORD DOPPLER               76820.02     PEGGY CONSTANT  ----------------------------------------------------------------------   #  Order #                    Accession #                 Episode #   1  [id number]                  [id number]                  [id number]   2  [id number]                  [id number]                  [id number]   3  [id number]                  [id number]                  [id number]   4  [id number]                  [id number]                  [id number]  ---------------------------------------------------------------------- Indications   Drug use complicating pregnancy, 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)   Poor obstetric history: Previous preterm       O09.219   delivery, antepartum (x2 31w1d36wks)   Twin pregnancy, di/di, third trimester         O30.043   Maternal care for known or suspected poor      O36.5931   fetal growth, third trimester, fetus 1   [redacted] weeks gestation of pregnancy                Z3A.30  ---------------------------------------------------------------------- Vital Signs                                                 Height:        5'6" ---------------------------------------------------------------------- Fetal Evaluation (Fetus A)  Num Of Fetuses:         2  Fetal Heart  Rate(bpm):  130  Cardiac Activity:       Observed  Fetal Lie:  Lower right Fetus  Presentation:           Cephalic  Amniotic Fluid  AFI FV:      Within normal limits                              Largest Pocket(cm)                              3.62  Comment:    Stomach and bladder noted. Breathing noted intermittently, but              not sustained. ---------------------------------------------------------------------- Biophysical Evaluation (Fetus A)  Amniotic F.V:   Within normal limits       F. Tone:        Observed  F. Movement:    Observed                   Score:          6/8  F. Breathing:   Not Observed ---------------------------------------------------------------------- Gestational Age (Fetus A)  LMP:           34w 1d        Date:  08/12/18                 EDD:   05/19/19  Best:          Weston Settle 4d     Det. 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) ---------------------------------------------------------------------- Doppler - Fetal Vessels (Fetus A)  Umbilical Artery   S/D     %tile     RI              PI                     ADFV    RDFV  1.98         7  0.49             0.68                        No      No ---------------------------------------------------------------------- Fetal Evaluation (Fetus B)  Num Of Fetuses:         2  Fetal Heart Rate(bpm):  140  Cardiac Activity:       Observed  Fetal Lie:              Upper left Fetus  Presentation:           Cephalic  Amniotic Fluid  AFI FV:      Within normal limits                              Largest Pocket(cm)  5.02  Comment:    Stomach and bladder noted. ---------------------------------------------------------------------- Biophysical Evaluation (Fetus B)  Amniotic F.V:   Within normal limits        F. Tone:        Observed  F. Movement:    Observed                   Score:          8/8  F. Breathing:   Observed ---------------------------------------------------------------------- Gestational Age (Fetus B)  LMP:           34w 1d        Date:  08/12/18                 EDD:   05/19/19  Best:          Weston Settle 4d     Det. By:  Previous Ultrasound      EDD:   06/13/19                                      (12/23/18) ---------------------------------------------------------------------- Doppler - Fetal Vessels (Fetus B)  Umbilical Artery   S/D     %tile     RI              PI  4.15       95   0.76             1.26 ---------------------------------------------------------------------- Impression  Biophysical profile 6/8 for A- improved amniotic fluid volume  and umbilical vein varix  Biophysical profile 8/8 B ---------------------------------------------------------------------- Recommendations  Mangement per inpatient team  Please reiterate pediatric echocardiogram appointment on  04/11/19. ----------------------------------------------------------------------               Sander Nephew, MD Electronically Signed Final Report   04/08/2019 10:33 am ----------------------------------------------------------------------  [place] Mfm Ua Addl Gest  Result Date: 03/22/2019 ----------------------------------------------------------------------  OBSTETRICS REPORT                       (Signed Final 03/22/2019 12:23 pm) ---------------------------------------------------------------------- Patient Info  ID #:       [id number]                          D.O.B.:  1986-11-22 (32 yrs)  Name:       Rosanna Randy                 Visit Date: 03/22/2019 11:16 am ---------------------------------------------------------------------- Performed By  Performed By:     Hubert Azure          Ref. Address:     Faculty                    RDMS  Attending:        Sander Nephew      [facility]  Referred By:      Vickii Chafe  CONSTANT MD ---------------------------------------------------------------------- Orders   #  Description                          Code         Ordered By   1  [place] MFM OB FOLLOW UP                  76816.01     RAVI SHANKAR   2  [place] MFM OB FOLLOW UP ADDL             91638.46     RAVI SHANKAR      [place]   3  [place] MFM UA CORD DOPPLER               76820.02     RAVI SHANKAR   4  [place] MFM UA ADDL GEST                  76820.01     RAVI SHANKAR  ----------------------------------------------------------------------   #  Order #                    Accession #                 Episode #   1  [id number]                  [id number]                  [id number]   2  [id number]                  [id number]                  [id number]   3  [id number]                  [id number]                  [id number]   4  [id number]                  [id number]                  [id number]  ---------------------------------------------------------------------- Indications   Drug use complicating pregnancy, 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)   Poor obstetric history: Previous preterm       O09.219   delivery, antepartum (x2 56w1d36wks)   Twin pregnancy, di/di, third trimester         O30.043   Maternal care for known or suspected poor      O36.5931   fetal growth, third trimester, fetus 1   [redacted] weeks gestation of pregnancy                Z3A.28  ---------------------------------------------------------------------- Vital Signs                                                 Height:        5'6" ---------------------------------------------------------------------- Fetal Evaluation (Fetus A)  Num Of Fetuses:         2  Fetal Heart Rate(bpm):  129  Cardiac Activity:       Observed  Fetal Lie:  Lower right Fetus  Presentation:           Transverse, head to maternal right  Placenta:               Posterior  Amniotic Fluid  AFI FV:       Oligohydramnios                              Largest Pocket(cm)                              1.66 ---------------------------------------------------------------------- Biometry (Fetus A)  BPD:      60.7  mm     G. Age:  24w 5d          0  %    CI:        74.22   %    70 - 86                                                          FL/HC:      19.3   %    18.8 - 20.6  HC:      223.7  mm     G. Age:  24w 3d        < 3  %    HC/AC:      1.09        1.05 - 1.21  AC:      204.6  mm     G. Age:  25w 0d        < 3  %    FL/BPD:     71.0   %    71 - 87  FL:       43.1  mm     G. Age:  24w 1d        < 3  %    FL/AC:      21.1   %    20 - 24  HUM:      40.1  mm     G. Age:  24w 3d        < 5  %  LV:        2.2  mm  Est. FW:     717  gm      1 lb 9 oz   < 10  %     FW Discordancy        25  % ---------------------------------------------------------------------- Gestational Age (Fetus A)  LMP:           31w 5d        Date:  08/12/18                 EDD:   05/19/19  U/S Today:     24w 4d                                        EDD:   07/08/19  Best:          28w 1d     Det. By:  Previous Ultrasound      EDD:  06/13/19                                      (12/23/18) ---------------------------------------------------------------------- Anatomy (Fetus A)  Cranium:               Appears normal         Aortic Arch:            Not well visualized  Cavum:                 Appears normal         Ductal Arch:            Not well visualized  Ventricles:            Previously seen        Diaphragm:              Previously seen  Choroid Plexus:        Previously seen        Stomach:                Appears normal, left                                                                        sided  Cerebellum:            Previously seen        Abdomen:                Appears normal  Posterior Fossa:       Previously seen        Abdominal Wall:         Previously seen  Nuchal Fold:           Not applicable (>78    Cord Vessels:            Previously seen                         wks GA)  Face:                  Appears normal         Kidneys:                Previously seen                         (orbits and profile)  Lips:                  Previously seen        Bladder:                Appears normal  Heart:                 See comments           Spine:                  Previously seen  RVOT:                  Previously seen  Upper Extremities:      Previously seen  LVOT:                  Previously seen        Lower Extremities:      Previously seen  Other:  Nasal bone visualized. Technically difficult due to fetal position. ---------------------------------------------------------------------- Doppler - Fetal Vessels (Fetus A)  Umbilical Artery   S/D     %tile     RI              PI                     ADFV    RDFV     2         4    0.5            0.67                        No      No ---------------------------------------------------------------------- Fetal Evaluation (Fetus B)  Num Of Fetuses:         2  Fetal Heart Rate(bpm):  158  Cardiac Activity:       Observed  Fetal Lie:              Upper left Fetus  Presentation:           Transverse, head to maternal left  Placenta:               Anterior  Amniotic Fluid  AFI FV:      Within normal limits                              Largest Pocket(cm)                              5.34 ---------------------------------------------------------------------- Biometry (Fetus B)  BPD:      64.8  mm     G. Age:  26w 2d          2  %    CI:        68.34   %    70 - 86                                                          FL/HC:      19.3   %    18.8 - 20.6  HC:      250.6  mm     G. Age:  27w 2d          5  %    HC/AC:      1.12        1.05 - 1.21  AC:      223.3  mm     G. Age:  26w 5d          9  %    FL/BPD:     74.7   %    71 - 87  FL:       48.4  mm     G. Age:  26w 2d        < 3  %  FL/AC:      21.7   %    20 - 24  HUM:      44.5  mm     G. Age:  26w 3d          7  %  Est. FW:     955  gm       2 lb 2 oz     22  %     FW Discordancy     0 \ 25 % ---------------------------------------------------------------------- Gestational Age (Fetus B)  LMP:           31w 5d        Date:  08/12/18                 EDD:   05/19/19  U/S Today:     26w 5d                                        EDD:   06/23/19  Best:          28w 1d     Det. By:  Previous Ultrasound      EDD:   06/13/19                                      (12/23/18) ---------------------------------------------------------------------- Doppler - Fetal Vessels (Fetus B)  Umbilical Artery   S/D     %tile     RI              PI                     ADFV    RDFV  4.14       92   0.76             1.33                        No      No ---------------------------------------------------------------------- Impression  Diamniotic Dichorionic Twin pregnancy  Suspected ASD  IUGR Twin A  UA Dopplers within normal limits for Twin A and B  Fetal echo pending ---------------------------------------------------------------------- Recommendations  Continue weekly UA Dopplers assessment  Initiate BPP in 2 weeks  Repeat growth in 4 weeks. ----------------------------------------------------------------------               Sander Nephew, MD Electronically Signed Final Report   03/22/2019 12:23 pm ----------------------------------------------------------------------  [place] Mfm Ua Addl Gest  Result Date: 03/15/2019 ----------------------------------------------------------------------  OBSTETRICS REPORT                       (Signed Final 03/15/2019 02:31 pm) ---------------------------------------------------------------------- Patient Info  ID #:       [id number]                          D.O.B.:  10-15-1986 (32 yrs)  Name:       Rosanna Randy                 Visit Date: 03/15/2019 01:04 pm ---------------------------------------------------------------------- Performed By  Performed By:     Hubert Azure          Ref. Address:     Faculty  RDMS   Attending:        Tama High MD        [facility]  Referred By:      Mora Bellman MD ---------------------------------------------------------------------- Orders   #  Description                          Code         Ordered By   1  [place] MFM UA CORD DOPPLER               76820.02     RAVI SHANKAR   2  [place] MFM UA ADDL GEST                  76820.01     RAVI SHANKAR  ----------------------------------------------------------------------   #  Order #                    Accession #                 Episode #   1  [id number]                  [id number]                  [id number]   2  [id number]                  [id number]                  [id number]  ---------------------------------------------------------------------- Indications   [redacted] weeks gestation of pregnancy                Z3A.27   Twin pregnancy, di/di, second trimester        N17.001   Drug use complicating pregnancy, 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)   Poor obstetric history: Previous preterm       O09.219   delivery, antepartum (x2 30w1d36wks)   Maternal care for known or suspected poor      O36.5921   fetal growth, second trimester, fetus 1  ---------------------------------------------------------------------- Vital Signs                                                 Height:        5'6" ---------------------------------------------------------------------- Fetal Evaluation (Fetus A)  Num Of Fetuses:         2  Fetal Heart Rate(bpm):  129  Cardiac Activity:       Observed  Fetal Lie:              Lower right Fetus  Presentation:           Cephalic  Placenta:  Posterior  Amniotic Fluid  AFI FV:      Subjectively low-normal                              Largest Pocket(cm)                              2.96  Comment:    Stomach, bladder and diaphragm noted.  ---------------------------------------------------------------------- Gestational Age (Fetus A)  LMP:           30w 5d        Date:  08/12/18                 EDD:   05/19/19  Best:          27w 1d     Det. By:  Previous Ultrasound      EDD:   06/13/19                                      (12/23/18) ---------------------------------------------------------------------- Doppler - Fetal Vessels (Fetus A)  Umbilical Artery   S/D     %tile     RI              PI                     ADFV    RDFV  1.94     < 2.5  0.48             0.63                        No      No ---------------------------------------------------------------------- Fetal Evaluation (Fetus B)  Num Of Fetuses:         2  Fetal Heart Rate(bpm):  140  Cardiac Activity:       Observed  Fetal Lie:              Upper left Fetus  Presentation:           Transverse, head to maternal right  Placenta:               Anterior  Amniotic Fluid  AFI FV:      Within normal limits                              Largest Pocket(cm)                              4.26  Comment:    Stomach, bladder, and diaphragm noted. ---------------------------------------------------------------------- Gestational Age (Fetus B)  LMP:           30w 5d        Date:  08/12/18                 EDD:   05/19/19  Best:          27w 1d     Det. By:  Previous Ultrasound      EDD:   06/13/19                                      (  12/23/18) ---------------------------------------------------------------------- Doppler - Fetal Vessels (Fetus B)  Umbilical Artery   S/D     %tile     RI              PI                     ADFV    RDFV  4.73       97   0.79             1.38                        No      No ---------------------------------------------------------------------- Impression  Dichorionic-diamniotic twin pregnancy.  Selective fetal growth restriction of twin A.  Twin A: Lower fetus, maternal right, posterior placenta,  cephalic presentation. Deepest vertical pocket measures 3  cm (restitution  of amniotic fluid is seen). Cardiomegaly is  seen. Umbilical artery Doppler showed normal diastolic flow.  Twin B: Upper fetus, maternal left, anterior placenta,  transverse lie and head to maternal right. Amniotic fluid is  normal and good fetal activity is seen. Umbilical artery  Doppler showed increased pulsatality index.  Patient had her second dose of betamethasone today. She  had missed feta Echo appointment that was rescheduled for  04/11/2019. Cardiomegaly is more-likely because of fetal  growth restriction than from structural anomaly.  I informed her that even though the presence of amniotic fluid  in twin A is reassuring, fetal compromise cannot be  predicated by antenatal testing and that twin A is still at a  higher risk of fetal death. ---------------------------------------------------------------------- Recommendations  -We will continue weekly Doppler studies and add BPP from  30 weeks' gestation. ----------------------------------------------------------------------                  Tama High, MD Electronically Signed Final Report   03/15/2019 02:31 pm ----------------------------------------------------------------------  [place] Mfm Ua Cord Doppler  Result Date: 04/08/2019 ----------------------------------------------------------------------  OBSTETRICS REPORT                       (Signed Final 04/08/2019 10:33 am) ---------------------------------------------------------------------- Patient Info  ID #:       [id number]                          D.O.B.:  1986/03/22 (32 yrs)  Name:       Rosanna Randy                 Visit Date: 04/08/2019 09:21 am ---------------------------------------------------------------------- Performed By  Performed By:     Hubert Azure          Ref. Address:     Faculty                    RDMS  Attending:        Sander Nephew      [facility]                                       [place]  Referred By:      Vickii Chafe  CONSTANT MD ---------------------------------------------------------------------- Orders   #  Description                          Code         Ordered By   1  [place] MFM FETAL BPP WO NON              76819.01     [place]      [place]   2  [place] MFM FETAL BPP WO NST              80881.1      [place]      ADDL GESTATION   3  [place] MFM UA ADDL GEST                  76820.01     PEGGY CONSTANT   4  [place] MFM UA CORD DOPPLER               76820.02     PEGGY CONSTANT  ----------------------------------------------------------------------   #  Order #                    Accession #                 Episode #   1  [id number]                  [id number]                  [id number]   2  [id number]                  [id number]                  [id number]   3  [id number]                  [id number]                  [id number]   4  [id number]                  [id number]                  [id number]  ---------------------------------------------------------------------- Indications   Drug use complicating pregnancy, 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)   Poor obstetric history: Previous preterm       O09.219   delivery, antepartum (x2 35w1d36wks)   Twin pregnancy, di/di, third trimester         O30.043   Maternal care for known or suspected poor      O36.5931   fetal growth, third trimester, fetus 1   [redacted] weeks gestation of pregnancy                Z3A.30  ---------------------------------------------------------------------- Vital Signs                                                 Height:        5'6" ---------------------------------------------------------------------- Fetal Evaluation (Fetus A)  Num Of Fetuses:         2  Fetal Heart Rate(bpm):  130  Cardiac Activity:       Observed  Fetal Lie:  Lower right Fetus  Presentation:           Cephalic  Amniotic Fluid  AFI FV:      Within normal limits                              Largest Pocket(cm)                              3.62  Comment:     Stomach and bladder noted. Breathing noted intermittently, but              not sustained. ---------------------------------------------------------------------- Biophysical Evaluation (Fetus A)  Amniotic F.V:   Within normal limits       F. Tone:        Observed  F. Movement:    Observed                   Score:          6/8  F. Breathing:   Not Observed ---------------------------------------------------------------------- Gestational Age (Fetus A)  LMP:           34w 1d        Date:  08/12/18                 EDD:   05/19/19  Best:          Weston Settle 4d     Det. 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) ---------------------------------------------------------------------- Doppler - Fetal Vessels (Fetus A)  Umbilical Artery   S/D     %tile     RI              PI                     ADFV    RDFV  1.98         7  0.49             0.68                        No      No ---------------------------------------------------------------------- Fetal Evaluation (Fetus B)  Num Of Fetuses:         2  Fetal Heart Rate(bpm):  140  Cardiac Activity:       Observed  Fetal Lie:              Upper left Fetus  Presentation:           Cephalic  Amniotic Fluid  AFI FV:      Within normal limits                              Largest Pocket(cm)  5.02  Comment:    Stomach and bladder noted. ---------------------------------------------------------------------- Biophysical Evaluation (Fetus B)  Amniotic F.V:   Within normal limits       F. Tone:        Observed  F. Movement:    Observed                   Score:          8/8  F. Breathing:   Observed ---------------------------------------------------------------------- Gestational Age (Fetus B)  LMP:           34w 1d        Date:  08/12/18                  EDD:   05/19/19  Best:          Weston Settle 4d     Det. By:  Previous Ultrasound      EDD:   06/13/19                                      (12/23/18) ---------------------------------------------------------------------- Doppler - Fetal Vessels (Fetus B)  Umbilical Artery   S/D     %tile     RI              PI  4.15       95   0.76             1.26 ---------------------------------------------------------------------- Impression  Biophysical profile 6/8 for A- improved amniotic fluid volume  and umbilical vein varix  Biophysical profile 8/8 B ---------------------------------------------------------------------- Recommendations  Mangement per inpatient team  Please reiterate pediatric echocardiogram appointment on  04/11/19. ----------------------------------------------------------------------               Sander Nephew, MD Electronically Signed Final Report   04/08/2019 10:33 am ----------------------------------------------------------------------  [place] [facility] Ua Cord Doppler  Result Date: 04/05/2019 ----------------------------------------------------------------------  OBSTETRICS REPORT                       (Signed Final 04/05/2019 03:28 pm) ---------------------------------------------------------------------- Patient Info  ID #:       [id number]                          D.O.B.:  [birth year] (32 yrs)  Name:       Rosanna Randy                 Visit Date: 04/05/2019 01:20 pm ---------------------------------------------------------------------- Performed By  Performed By:     Novella Rob        Ref. Address:     Faculty                    RDMS  Attending:        Sander Nephew      [facility]                    MD                                       Fetal Care  Referred By:      Vickii Chafe  CONSTANT MD ---------------------------------------------------------------------- Orders   #  Description                          Code         Ordered By   1  [place] MFM UA CORD  DOPPLER               76820.02     [place]   2  [place] MFM UA DOPPLER ADDL               76820.03     [place]      GEST RE EVAL                                      [place]   3  [place] MFM FETAL BPP WO NON              76819.01     [place]   4  [place] M[place] FETAL BPP WO NST              55374.8      [place]      ADDL GESTATION                                    [place]  ----------------------------------------------------------------------   #  Order #                    Accession #                 Episode #   1  [id number]                  [id number]                  [id number]   2  [id number]                  [id number]                  [id number]   3  [id number]                  [id number]                  [id number]   4  [id number]                  [id number]                  [id number]  ---------------------------------------------------------------------- Indications   Drug use complicating pregnancy, 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)   Poor obstetric history: Previous  preterm       O09.219   delivery, antepartum (x2 42w1d36wks)   Twin pregnancy, di/di, third trimester         O30.043   Maternal care for known or suspected poor      O36.5931   fetal growth, third trimester, fetus 1   [redacted] weeks gestation of pregnancy                Z3A.30  ---------------------------------------------------------------------- Vital Signs  Weight (lb): 172                               Height:        5'6"  BMI:         27.76 ---------------------------------------------------------------------- Fetal Evaluation (Fetus A)  Num Of Fetuses:         2  Fetal Heart Rate(bpm):  117  Cardiac Activity:       Bradycardia  Fetal Lie:              Lower Fetus  Presentation:           Cephalic  Placenta:               Posterior  Amniotic Fluid  AFI FV:      Oligohydramnios                              Largest  Pocket(cm)                              1.78 ---------------------------------------------------------------------- Biophysical Evaluation (Fetus A)  Amniotic F.V:   Oligohydramnios            F. Tone:        Not Observed  F. Movement:    Not Observed               Score:          0/8  F. Breathing:   Not Observed ---------------------------------------------------------------------- Gestational Age (Fetus A)  LMP:           33w 5d        Date:  08/12/18                 EDD:   05/19/19  Best:          30w 1d     Det. By:  Previous Ultrasound      EDD:   06/13/19                                      (12/23/18) ---------------------------------------------------------------------- Anatomy (Fetus A)  Thoracic:              Pericardial            Stomach:                Appears normal, left                         effusion  sided  Heart:                 Cardiomegaly           Bladder:                Appears normal ---------------------------------------------------------------------- Doppler - Fetal Vessels (Fetus A)  Umbilical Artery   S/D     %tile  3.14       64 ---------------------------------------------------------------------- Fetal Evaluation (Fetus B)  Num Of Fetuses:         2  Fetal Heart Rate(bpm):  144  Cardiac Activity:       Observed  Fetal Lie:              Upper Fetus  Presentation:           Cephalic  Placenta:               Anterior  Amniotic Fluid  AFI FV:      Within normal limits                              Largest Pocket(cm)                              4.94 ---------------------------------------------------------------------- Biophysical Evaluation (Fetus B)  Amniotic F.V:   Within normal limits       F. Tone:        Observed  F. Movement:    Observed                   Score:          8/8  F. Breathing:   Observed ---------------------------------------------------------------------- Gestational Age (Fetus B)  LMP:            33w 5d        Date:  08/12/18                 EDD:   05/19/19  Best:          30w 1d     Det. By:  Previous Ultrasound      EDD:   06/13/19                                      (12/23/18) ---------------------------------------------------------------------- Anatomy (Fetus B)  Stomach:               Appears normal, left   Bladder:                Appears normal                         sided ---------------------------------------------------------------------- Doppler - Fetal Vessels (Fetus B)  Umbilical Artery   S/D     %tile  6.03    > 97.5 ---------------------------------------------------------------------- Cervix Uterus Adnexa  Cervix  Not visualized (advanced GA >24wks) ---------------------------------------------------------------------- Comments  I met with Ms. Gervacio to discuss today's finding of a BPP of  0/8 for Twin A with elevated Dopplers in Twin B. Twin A has  low amniotic fluid , no movement, tone or breathing. We  discussed that a BPP can be inaccuarate at this gestational  and that further evaluation and monitoring should happe  immediately. Secondly, we discussed the benefits and risks  of preterm  delivery to avoid perinatal mortality in Twin A but  the associated preterm complications on otherwise healthy  Twin B. At this time I recommended further monitoring on  L&D with NICU consultation. Ms. Sigman expressed a desire  for cesarean delivery if there is any risk to Twin A. She feels  comfortable with the possible risk to Twin B.  I discussed this case with Dr. Rosana Hoes. ---------------------------------------------------------------------- Impression  Twin A IUGR with BPP 0/8  Twin B elevated UA Dopplers  Diamniotic Twin pregnancy. ---------------------------------------------------------------------- Recommendations  To L&D for admission possible delivery. ----------------------------------------------------------------------               Sander Nephew, MD Electronically Signed Final Report    04/05/2019 03:28 pm ----------------------------------------------------------------------  [place] [facility]fm Ua Cord Doppler  Result Date: 04/02/2019 ----------------------------------------------------------------------  OBSTETRICS REPORT                       (Signed Final 04/02/2019 04:12 pm) ---------------------------------------------------------------------- Patient Info  ID #:       [id number]                          D.O.B.:  July 16, 1986 (32 yrs)  Name:       Rosanna Randy                 Visit Date: 04/02/2019 03:02 pm ---------------------------------------------------------------------- Performed By  Performed By:     Novella Rob        Ref. Address:     Faculty                    RDMS  Attending:        Sander Nephew      [facility]                    MD                                       Fetal Care  Referred By:      Vickii Chafe                    CONSTANT MD ---------------------------------------------------------------------- Orders   #  Description                          Code         Ordered By   1  [place] MFM UA CORD DOPPLER               76820.02     [place]   2  [place] MFM UA DOPPLER ADDL               35573.22     [place]      GEST RE EVAL  [place]  ----------------------------------------------------------------------   #  Order #                    Accession #                 Episode #   1  [id number]                  [id number]                  [id number]   2  [id number]                  [id number]                  [id number]  ---------------------------------------------------------------------- Indications   Drug use complicating pregnancy, 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)   Poor obstetric history: Previous preterm       O09.219   delivery, antepartum (x2 61w1d36wks)   Twin pregnancy, di/di, third trimester         O30.043   Maternal care for known or  suspected poor      O36.5931   fetal growth, third trimester, fetus 1   [redacted] weeks gestation of pregnancy                Z3A.29  ---------------------------------------------------------------------- Vital Signs  Weight (lb): 166                               Height:        5'6"  BMI:         26.79 ---------------------------------------------------------------------- Fetal Evaluation (Fetus A)  Num Of Fetuses:         2  Fetal Heart Rate(bpm):  122  Cardiac Activity:       Observed  Fetal Lie:              Lower right Fetus  Presentation:           Cephalic  Placenta:               Posterior  Amniotic Fluid  AFI FV:      Oligohydramnios                              Largest Pocket(cm)                              1.3 ---------------------------------------------------------------------- Gestational Age (Fetus A)  LMP:           33w 2d        Date:  08/12/18                 EDD:   05/19/19  Best:          29w 5d     Det. By:  Previous Ultrasound      EDD:   06/13/19                                      (12/23/18) ---------------------------------------------------------------------- Doppler - Fetal Vessels (Fetus A)  Umbilical Artery   S/D     %tile  2.22       14 ---------------------------------------------------------------------- Fetal Evaluation (  Fetus B)  Num Of Fetuses:         2  Fetal Heart Rate(bpm):  158  Cardiac Activity:       Observed  Fetal Lie:              Upper left Fetus  Presentation:           Cephalic  Placenta:               Anterior  Amniotic Fluid  AFI FV:      Within normal limits                              Largest Pocket(cm)                              6.22 ---------------------------------------------------------------------- Gestational Age (Fetus B)  LMP:           33w 2d        Date:  08/12/18                 EDD:   05/19/19  Best:          29w 5d     Det. By:  Previous Ultrasound      EDD:   06/13/19                                      (12/23/18)  ---------------------------------------------------------------------- Doppler - Fetal Vessels (Fetus B)  Umbilical Artery   S/D     %tile  5.04    > 97.5 ---------------------------------------------------------------------- Cervix Uterus Adnexa  Cervix  Not visualized (advanced GA >24wks) ---------------------------------------------------------------------- Impression  Dichorionic Diamniotic twin pregnancy  IUGR Twin A  Elevated UA Dopplers in Twin B ---------------------------------------------------------------------- Recommendations  Follow up UA dopplers on Friday ----------------------------------------------------------------------               Sander Nephew, MD Electronically Signed Final Report   04/02/2019 04:12 pm ----------------------------------------------------------------------  [place] [facility] Ua Cord Doppler  Result Date: 03/22/2019 ----------------------------------------------------------------------  OBSTETRICS REPORT                       (Signed Final 03/22/2019 12:23 pm) ---------------------------------------------------------------------- Patient Info  ID #:       [id number]                          D.O.B.:  1986-03-23 (32 yrs)  Name:       Rosanna Randy                 Visit Date: 03/22/2019 11:16 am ---------------------------------------------------------------------- Performed By  Performed By:     Hubert Azure          Ref. Address:     Faculty                    RDMS  Attending:        Sander Nephew      [facility]                    MD  Fetal Care  Referred By:      Vickii Chafe                    CONSTANT MD ---------------------------------------------------------------------- Orders   #  Description                          Code         Ordered By   1  [place] MFM OB FOLLOW UP                  23536.14     [place]   2  [place] MFM OB FOLLOW UP ADDL             43154.00     [facility]VI SHANKAR      [facility]   3  [place] MFM UA CORD DOPPLER                76820.02     [facility] [place]   4  [place] MFM UA ADDL GEST                  76820.01     [facility] SHANKAR  ----------------------------------------------------------------------   #  Order #                    Accession #                 Episode #   1  [id number]                  [id number]                  [id number]   2  [id number]                  [id number]                  [id number]   3  [id number]                  [id number]                  [id number]   4  [id number]                  [id number]                  [id number]  ---------------------------------------------------------------------- Indications   Drug use complicating pregnancy, 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)   Poor obstetric history: Previous preterm       O09.219   delivery, antepartum (x2 27w1d36wks)   Twin pregnancy, di/di, third trimester         O30.043   Maternal care for known or suspected poor      O36.5931   fetal growth, third trimester, fetus 1   [redacted] weeks gestation of pregnancy                Z3A.28  ---------------------------------------------------------------------- Vital Signs                                                 Height:        5'6" ---------------------------------------------------------------------- Fetal Evaluation (Fetus A)  Num Of Fetuses:  2  Fetal Heart Rate(bpm):  129  Cardiac Activity:       Observed  Fetal Lie:              Lower right Fetus  Presentation:           Transverse, head to maternal right  Placenta:               Posterior  Amniotic Fluid  AFI FV:      Oligohydramnios                              Largest Pocket(cm)                              1.66 ---------------------------------------------------------------------- Biometry (Fetus A)  BPD:      60.7  mm     G. Age:  24w 5d          0  %    CI:        74.22   %    70 - 86                                                          FL/HC:      19.3   %    18.8 - 20.6  HC:      223.7  mm     G. Age:  24w 3d        < 3  %     HC/AC:      1.09        1.05 - 1.21  AC:      204.6  mm     G. Age:  25w 0d        < 3  %    FL/BPD:     71.0   %    71 - 87  FL:       43.1  mm     G. Age:  24w 1d        < 3  %    FL/AC:      21.1   %    20 - 24  HUM:      40.1  mm     G. Age:  24w 3d        < 5  %  LV:        2.2  mm  Est. FW:     717  gm      1 lb 9 oz   < 10  %     FW Discordancy        25  % ---------------------------------------------------------------------- Gestational Age (Fetus A)  LMP:           31w 5d        Date:  08/12/18                 EDD:   05/19/19  U/S Today:     24w 4d  EDD:   07/08/19  Best:          28w 1d     Det. By:  Previous Ultrasound      EDD:   06/13/19                                      (12/23/18) ---------------------------------------------------------------------- Anatomy (Fetus A)  Cranium:               Appears normal         Aortic Arch:            Not well visualized  Cavum:                 Appears normal         Ductal Arch:            Not well visualized  Ventricles:            Previously seen        Diaphragm:              Previously seen  Choroid Plexus:        Previously seen        Stomach:                Appears normal, left                                                                        sided  Cerebellum:            Previously seen        Abdomen:                Appears normal  Posterior Fossa:       Previously seen        Abdominal Wall:         Previously seen  Nuchal Fold:           Not applicable (>79    Cord Vessels:           Previously seen                         wks GA)  Face:                  Appears normal         Kidneys:                Previously seen                         (orbits and profile)  Lips:                  Previously seen        Bladder:                Appears normal  Heart:                 See comments           Spine:  Previously seen  RVOT:                  Previously seen        Upper Extremities:       Previously seen  LVOT:                  Previously seen        Lower Extremities:      Previously seen  Other:  Nasal bone visualized. Technically difficult due to fetal position. ---------------------------------------------------------------------- Doppler - Fetal Vessels (Fetus A)  Umbilical Artery   S/D     %tile     RI              PI                     ADFV    RDFV     2         4    0.5            0.67                        No      No ---------------------------------------------------------------------- Fetal Evaluation (Fetus B)  Num Of Fetuses:         2  Fetal Heart Rate(bpm):  158  Cardiac Activity:       Observed  Fetal Lie:              Upper left Fetus  Presentation:           Transverse, head to maternal left  Placenta:               Anterior  Amniotic Fluid  AFI FV:      Within normal limits                              Largest Pocket(cm)                              5.34 ---------------------------------------------------------------------- Biometry (Fetus B)  BPD:      64.8  mm     G. Age:  26w 2d          2  %    CI:        68.34   %    70 - 86                                                          FL/HC:      19.3   %    18.8 - 20.6  HC:      250.6  mm     G. Age:  27w 2d          5  %    HC/AC:      1.12        1.05 - 1.21  AC:      223.3  mm     G. Age:  26w 5d          9  %    FL/BPD:     74.7   %    71 -  87  FL:       48.4  mm     G. Age:  26w 2d        < 3  %    FL/AC:      21.7   %    20 - 24  HUM:      44.5  mm     G. Age:  26w 3d          7  %  Est. FW:     955  gm      2 lb 2 oz     22  %     FW Discordancy     0 \ 25 % ---------------------------------------------------------------------- Gestational Age (Fetus B)  LMP:           31w 5d        Date:  08/12/18                 EDD:   05/19/19  U/S Today:     26w 5d                                        EDD:   06/23/19  Best:          28w 1d     Det. By:  Previous Ultrasound      EDD:   06/13/19                                       (12/23/18) ---------------------------------------------------------------------- Doppler - Fetal Vessels (Fetus B)  Umbilical Artery   S/D     %tile     RI              PI                     ADFV    RDFV  4.14       92   0.76             1.33                        No      No ---------------------------------------------------------------------- Impression  Diamniotic Dichorionic Twin pregnancy  Suspected ASD  IUGR Twin A  UA Dopplers within normal limits for Twin A and B  Fetal echo pending ---------------------------------------------------------------------- Recommendations  Continue weekly UA Dopplers assessment  Initiate BPP in 2 weeks  Repeat growth in 4 weeks. ----------------------------------------------------------------------               Sander Nephew, MD Electronically Signed Final Report   03/22/2019 12:23 pm ----------------------------------------------------------------------  [place] [facility] Ua Cord Doppler  Result Date: 03/15/2019 ----------------------------------------------------------------------  OBSTETRICS REPORT                       (Signed Final 03/15/2019 02:31 pm) ---------------------------------------------------------------------- Patient Info  ID #:       [id number]                          D.O.B.:  07/25/1986 (32 yrs)  Name:       Rosanna Randy                 Visit Date:  03/15/2019 01:04 pm ---------------------------------------------------------------------- Performed By  Performed By:     Hubert Azure          Ref. Address:     Faculty                    RDMS  Attending:        Tama High MD        Location[facility]                                                             Fetal Care  Referred By:      Mora Bellman MD ---------------------------------------------------------------------- Orders   #  Description                          Code         Ordered By   1  [place] MFM UA CORD DOPPLER               76820.02     [facility]   2   [place] MFM UA ADDL GEST                  76820.01     [facility] SHANKAR  ----------------------------------------------------------------------   #  Order #                    Accession #                 Episode #   1  [id number]                  [id number]                  [id number]   2  [id number]                  [id number]                  [id number]  ---------------------------------------------------------------------- Indications   [redacted] weeks gestation of pregnancy                Z3A.27   Twin pregnancy, di/di, second trimester        P53.614   Drug use complicating pregnancy, 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)   Poor obstetric history: Previous preterm       O09.219   delivery, antepartum (x2 9w1d36wks)   Maternal care for known or suspected poor      O36.5921   fetal growth, second trimester, fetus 1  ---------------------------------------------------------------------- Vital Signs                                                 Height:        5'6" ---------------------------------------------------------------------- Fetal Evaluation (Fetus A)  Num Of Fetuses:         2  Fetal Heart Rate(bpm):  129  Cardiac Activity:       Observed  Fetal Lie:              Lower right Fetus  Presentation:           Cephalic  Placenta:               Posterior  Amniotic Fluid  AFI FV:      Subjectively low-normal                              Largest Pocket(cm)                              2.96  Comment:    Stomach, bladder and diaphragm noted. ---------------------------------------------------------------------- Gestational Age (Fetus A)  LMP:           30w 5d        Date:  08/12/18                 EDD:   05/19/19  Best:          27w 1d     Det. By:  Previous Ultrasound      EDD:   06/13/19                                      (12/23/18) ---------------------------------------------------------------------- Doppler - Fetal Vessels (Fetus A)  Umbilical Artery   S/D     %tile     RI              PI                      ADFV    RDFV  1.94     < 2.5  0.48             0.63                        No      No ---------------------------------------------------------------------- Fetal Evaluation (Fetus B)  Num Of Fetuses:         2  Fetal Heart Rate(bpm):  140  Cardiac Activity:       Observed  Fetal Lie:              Upper left Fetus  Presentation:           Transverse, head to maternal right  Placenta:               Anterior  Amniotic Fluid  AFI FV:      Within normal limits                              Largest Pocket(cm)                              4.26  Comment:    Stomach, bladder, and diaphragm noted. ---------------------------------------------------------------------- Gestational Age (Fetus B)  LMP:           30w 5d        Date:  08/12/18                 EDD:   05/19/19  Best:          27w 1d     Det. By:  Previous Ultrasound      EDD:   06/13/19                                      (12/23/18) ---------------------------------------------------------------------- Doppler - Fetal Vessels (Fetus B)  Umbilical Artery   S/D     %tile     RI              PI                     ADFV    RDFV  4.73       97   0.79             1.38                        No      No ---------------------------------------------------------------------- Impression  Dichorionic-diamniotic twin pregnancy.  Selective fetal growth restriction of twin A.  Twin A: Lower fetus, maternal right, posterior placenta,  cephalic presentation. Deepest vertical pocket measures 3  cm (restitution of amniotic fluid is seen). Cardiomegaly is  seen. Umbilical artery Doppler showed normal diastolic flow.  Twin B: Upper fetus, maternal left, anterior placenta,  transverse lie and head to maternal right. Amniotic fluid is  normal and good fetal activity is seen. Umbilical artery  Doppler showed increased pulsatality index.  Patient had her second dose of betamethasone today. She  had missed feta Echo appointment that was rescheduled for  04/11/2019. Cardiomegaly is  more-likely because of fetal  growth restriction than from structural anomaly.  I informed her that even though the presence of amniotic fluid  in twin A is reassuring, fetal compromise cannot be  predicated by antenatal testing and that twin A is still at a  higher risk of fetal death. ---------------------------------------------------------------------- Recommendations  -We will continue weekly Doppler studies and add BPP from  30 weeks' gestation. ----------------------------------------------------------------------                  Tama High, MD Electronically Signed Final Report   03/15/2019 02:31 pm ----------------------------------------------------------------------  [place] Mfm Ua Doppler Addl Gest Re Eval  Result Date: 04/05/2019 ----------------------------------------------------------------------  OBSTETRICS REPORT                       (Signed Final 04/05/2019 03:28 pm) ---------------------------------------------------------------------- Patient Info  ID #:       [id number]                          D.O.B.:  01-Feb-1986 (32 yrs)  Name:       Rosanna Randy                 Visit Date: 04/05/2019 01:20 pm ---------------------------------------------------------------------- Performed By  Performed By:     Novella Rob        Ref. Address:     Faculty                    RDMS  Attending:        Sander Nephew      [facility]                                       [facility]  Referred By:      Mora Bellman MD ---------------------------------------------------------------------- Orders   #  Description                          Code         Ordered By   1  [place] MFM UA CORD DOPPLER               16109.60     [place]   2  [place] MFM UA DOPPLER ADDL               45409.81     [place]      GEST RE EVAL                                      [place]   3  [place] MFM FETAL BPP WO NON               76819.01     [place]   4  [place] MFM FETAL BPP WO NST              19147.8      [place]      ADDL GESTATION                                    BOOKER  ----------------------------------------------------------------------   #  Order #                    Accession #                 Episode #   1  [id number]                  [id number]                  [id number]   2  [id number]                  [id number]                  [id number]   3  [id number]                  [id number]                  [id number]   4  [id number]                  [id number]                  [id number]  ---------------------------------------------------------------------- Indications   Drug use complicating pregnancy, 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)   Poor obstetric history: Previous preterm       O09.219   delivery, antepartum (x2 34w1d36wks)   Twin pregnancy, di/di, third trimester         O30.043   Maternal care for known or suspected poor      O36.5931   fetal growth, third trimester, fetus 1   [redacted] weeks gestation of pregnancy                Z3A.30  ---------------------------------------------------------------------- Vital Signs  Weight (lb): 172                               Height:        5'6"  BMI:         27.76 ---------------------------------------------------------------------- Fetal Evaluation (Fetus A)  Num Of Fetuses:         2  Fetal Heart Rate(bpm):  117  Cardiac Activity:       Bradycardia  Fetal Lie:              Lower Fetus  Presentation:           Cephalic  Placenta:               Posterior  Amniotic Fluid  AFI FV:      Oligohydramnios                              Largest Pocket(cm)                              1.78 ---------------------------------------------------------------------- Biophysical Evaluation (Fetus A)  Amniotic F.V:   Oligohydramnios            F. Tone:        Not Observed  F. Movement:    Not Observed                Score:          0/8  F. Breathing:   Not Observed ---------------------------------------------------------------------- Gestational Age (Fetus A)  LMP:           33w 5d        Date:  08/12/18                 EDD:   05/19/19  Best:          30w 1d     Det. By:  Previous Ultrasound      EDD:   06/13/19                                      (12/23/18) ---------------------------------------------------------------------- Anatomy (Fetus A)  Thoracic:              Pericardial            Stomach:                Appears normal, left  effusion                                                                        sided  Heart:                 Cardiomegaly           Bladder:                Appears normal ---------------------------------------------------------------------- Doppler - Fetal Vessels (Fetus A)  Umbilical Artery   S/D     %tile  3.14       64 ---------------------------------------------------------------------- Fetal Evaluation (Fetus B)  Num Of Fetuses:         2  Fetal Heart Rate(bpm):  144  Cardiac Activity:       Observed  Fetal Lie:              Upper Fetus  Presentation:           Cephalic  Placenta:               Anterior  Amniotic Fluid  AFI FV:      Within normal limits                              Largest Pocket(cm)                              4.94 ---------------------------------------------------------------------- Biophysical Evaluation (Fetus B)  Amniotic F.V:   Within normal limits       F. Tone:        Observed  F. Movement:    Observed                   Score:          8/8  F. Breathing:   Observed ---------------------------------------------------------------------- Gestational Age (Fetus B)  LMP:           33w 5d        Date:  08/12/18                 EDD:   05/19/19  Best:          30w 1d     Det. By:  Previous Ultrasound      EDD:   06/13/19                                      (12/23/18) ----------------------------------------------------------------------  Anatomy (Fetus B)  Stomach:               Appears normal, left   Bladder:                Appears normal                         sided ---------------------------------------------------------------------- Doppler - Fetal Vessels (Fetus B)  Umbilical Artery   S/D     %tile  6.03    > 97.5 ---------------------------------------------------------------------- Cervix Uterus Adnexa  Cervix  Not visualized (advanced GA >24wks) ----------------------------------------------------------------------  Comments  I met with Ms. Laverdiere to discuss today's finding of a BPP of  0/8 for Twin A with elevated Dopplers in Twin B. Twin A has  low amniotic fluid , no movement, tone or breathing. We  discussed that a BPP can be inaccuarate at this gestational  and that further evaluation and monitoring should happe  immediately. Secondly, we discussed the benefits and risks  of preterm delivery to avoid perinatal mortality in Twin A but  the associated preterm complications on otherwise healthy  Twin B. At this time I recommended further monitoring on  L&D with NICU consultation. Ms. Kindler expressed a desire  for cesarean delivery if there is any risk to Twin A. She feels  comfortable with the possible risk to Twin B.  I discussed this case with Dr. Rosana Hoes. ---------------------------------------------------------------------- Impression  Twin A IUGR with BPP 0/8  Twin B elevated UA Dopplers  Diamniotic Twin pregnancy. ---------------------------------------------------------------------- Recommendations  To L&D for admission possible delivery. ----------------------------------------------------------------------               Sander Nephew, MD Electronically Signed Final Report   04/05/2019 03:28 pm ----------------------------------------------------------------------  [place] Mfm Ua Doppler Addl Gest Re Eval  Result Date: 04/02/2019 ----------------------------------------------------------------------  OBSTETRICS REPORT                        (Signed Final 04/02/2019 04:12 pm) ---------------------------------------------------------------------- Patient Info  ID #:       [id number]                          D.O.B.:  [birth year] (32 yrs)  Name:       Rosanna Randy                 Visit Date: 04/02/2019 03:02 pm ---------------------------------------------------------------------- Performed By  Performed By:     Novella Rob        Ref. Address:     Faculty                    RDMS  Attending:        Sander Nephew      [facility]                                       [facility]  Referred By:      Mora Bellman MD ---------------------------------------------------------------------- Orders   #  Description                          Code         Ordered By   1  [place] MFM UA CORD DOPPLER               00923.30     [place]  [place]   2  [place] MFM UA DOPPLER ADDL               [id number]     [place]      GEST RE EVAL                                      [place]ER  ----------------------------------------------------------------------   #  Order #                    Accession #                 Episode #   1  [id number]                  [id number]                  [id number]   2  [id number]                  [id number]                  [id number]  ---------------------------------------------------------------------- Indications   Drug use complicating pregnancy, 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)   Poor obstetric history: Previous preterm       O09.219   delivery, antepartum (x2 26w1d36wks)   Twin pregnancy, di/di, third trimester         O30.043   Maternal care for known or suspected poor      O36.5931   fetal growth, third trimester, fetus 1   [redacted] weeks gestation of pregnancy                Z3A.29  ---------------------------------------------------------------------- Vital Signs  Weight (lb): 166                                Height:        5'6"  BMI:         26.79 ---------------------------------------------------------------------- Fetal Evaluation (Fetus A)  Num Of Fetuses:         2  Fetal Heart Rate(bpm):  122  Cardiac Activity:       Observed  Fetal Lie:              Lower right Fetus  Presentation:           Cephalic  Placenta:               Posterior  Amniotic Fluid  AFI FV:      Oligohydramnios                              Largest Pocket(cm)                              1.3 ---------------------------------------------------------------------- Gestational Age (Fetus A)  LMP:           33w 2d        Date:  08/12/18                 EDD:   05/19/19  Best:          29w 5d     Det. By:  Previous Ultrasound  EDD:   06/13/19                                      (12/23/18) ---------------------------------------------------------------------- Doppler - Fetal Vessels (Fetus A)  Umbilical Artery   S/D     %tile  2.22       14 ---------------------------------------------------------------------- Fetal Evaluation (Fetus B)  Num Of Fetuses:         2  Fetal Heart Rate(bpm):  158  Cardiac Activity:       Observed  Fetal Lie:              Upper left Fetus  Presentation:           Cephalic  Placenta:               Anterior  Amniotic Fluid  AFI FV:      Within normal limits                              Largest Pocket(cm)                              6.22 ---------------------------------------------------------------------- Gestational Age (Fetus B)  LMP:           33w 2d        Date:  08/12/18                 EDD:   05/19/19  Best:          29w 5d     Det. By:  Previous Ultrasound      EDD:   06/13/19                                      (12/23/18) ---------------------------------------------------------------------- Doppler - Fetal Vessels (Fetus B)  Umbilical Artery   S/D     %tile  5.04    > 97.5 ---------------------------------------------------------------------- Cervix Uterus Adnexa  Cervix  Not visualized (advanced GA  >24wks) ---------------------------------------------------------------------- Impression  Dichorionic Diamniotic twin pregnancy  IUGR Twin A  Elevated UA Dopplers in Twin B ---------------------------------------------------------------------- Recommendations  Follow up UA dopplers on Friday ----------------------------------------------------------------------               Sander Nephew, MD Electronically Signed Final Report   04/02/2019 04:12 pm ----------------------------------------------------------------------  US Fetal Bpp Wo Nst Addl Gestation  Result Date: 04/08/2019 ----------------------------------------------------------------------  OBSTETRICS REPORT                       (Signed Final 04/08/2019 05:24 pm) ---------------------------------------------------------------------- Patient Info  ID #:       [id number]                          D.O.B.:  1986/03/13 (32 yrs)  Name:       Rosanna Randy                 Visit Date: 04/06/2019 11:54 am ---------------------------------------------------------------------- Performed By  Performed By:     Wilnette Kales        Ref. Address:     Faculty  RDMS,RVT  Attending:        Sander Nephew      [facility]                                       [place]  Referred By:      Mora Bellman MD ---------------------------------------------------------------------- Orders   #  Description                          Code         Ordered By   1  [place] MFM FETAL BPP WO NST              36644.0      CHARLIE PICKENS      ADDL GESTATION   2  [place] MFM FETAL BPP WO NON              76819.01     CHARLIE PICKENS      [place]  ----------------------------------------------------------------------   #  Order #                    Accession #                 Episode #   1  [id number]                  [id number]                  [id number]   2  [id number]                  [id number]                   [id number]  ---------------------------------------------------------------------- Indications   Drug use complicating pregnancy, 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)   Poor obstetric history: Previous preterm       O09.219   delivery, antepartum (x2 54w1d36wks)   Twin pregnancy, di/di, third trimester         O30.043   Maternal care for known or suspected poor      O36.5931   fetal growth, third trimester, fetus 1   [redacted] weeks gestation of pregnancy                Z3A.30  ---------------------------------------------------------------------- Vital Signs                                                 Height:        5'6" ---------------------------------------------------------------------- Fetal Evaluation (Fetus A)  Num Of Fetuses:         2  Fetal Heart Rate(bpm):  140  Cardiac Activity:       Observed  Fetal Lie:              Lower Fetus  Presentation:           Cephalic  Placenta:  Posterior  P. Cord Insertion:      Previously Visualized  Membrane Desc:      Dividing Membrane seen  Amniotic Fluid  AFI FV:      Within normal limits                              Largest Pocket(cm)                              6.24 ---------------------------------------------------------------------- Biophysical Evaluation (Fetus A)  Amniotic F.V:   Within normal limits       F. Tone:        Observed  F. Movement:    Observed                   Score:          8/8  F. Breathing:   Observed ---------------------------------------------------------------------- Gestational Age (Fetus A)  LMP:           33w 6d        Date:  08/12/18                 EDD:   05/19/19  Best:          [place]on Settle 2d     Det. By:  Previous Ultrasound      EDD:   06/13/19                                      (12/23/18) ---------------------------------------------------------------------- Anatomy (Fetus A)  Ventricles:            Appears normal         Kidneys:                Appear normal  Heart:                 Appears normal          Bladder:                Appears normal                         (4CH, axis, and                         situs)  Stomach:               Appears normal, left                         sided ---------------------------------------------------------------------- Fetal Evaluation (Fetus B)  Num Of Fetuses:         2  Fetal Heart Rate(bpm):  132  Cardiac Activity:       Observed  Fetal Lie:              Upper Fetus  Presentation:           Variable  Placenta:               Anterior  P. Cord Insertion:      Visualized, central  Amniotic Fluid  AFI FV:      Within normal limits  Largest Pocket(cm)                              4.8 ---------------------------------------------------------------------- Biophysical Evaluation (Fetus B)  Amniotic F.V:   Within normal limits       F. Tone:        Observed  F. Movement:    Observed                   Score:          8/8  F. Breathing:   Observed ---------------------------------------------------------------------- Gestational Age (Fetus B)  LMP:           33w 6d        Date:  08/12/18                 EDD:   05/19/19  Best:          Weston Settle 2d     Det. 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 ----------------------------------------------------------------------               Sander Nephew, MD Electronically Signed Final Report   04/08/2019 05:24 pm ----------------------------------------------------------------------  US Fetal Bpp Wo Nst Addl Gestation  Result Date: 04/08/2019 ----------------------------------------------------------------------  OBSTETRICS REPORT                       (Signed Final 04/08/2019 10:33 am) ---------------------------------------------------------------------- Patient Info  ID #:       [id number]                          D.O.B.:  02-12-1986 (32 yrs)  Name:       Rosanna Randy                 Visit Date: 04/08/2019 09:21 am ---------------------------------------------------------------------- Performed By  Performed By:  Alyssa Keatts          Ref. Address:     Faculty                    RDMS  Attending:        Sander Nephew      [facility]                                       [place]  Referred By:      Mora Bellman MD ---------------------------------------------------------------------- Orders   #  Description                          Code         Ordered By   1  [place] MFM FETAL BPP WO NON              76819.01     [place]   2  [place] MFM FETAL BPP WO NST              22025.4      [place]      ADDL GESTATION   3  [place] MFM UA ADDL GEST                  76820.01     [place]   4  [place] MFM UA CORD DOPPLER               76820.02     [place]  ----------------------------------------------------------------------   #  Order #                    Accession #                 Episode #   1  [id number]                  [id number]                  [id number]   2  [id number]                  [id number]                  [id number]   3  [id number]                  [id number]                   [id number]   4  [id number]                  [id number]                  [id number]  ---------------------------------------------------------------------- Indications   Drug use complicating pregnancy, 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)   Poor obstetric history: Previous preterm       O09.219   delivery, antepartum (x2 82w1d36wks)   Twin pregnancy, di/di, third trimester  O30.043   Maternal care for known or suspected poor      O36.5931   fetal growth, third trimester, fetus 1   [redacted] weeks gestation of pregnancy                Z3A.30  ---------------------------------------------------------------------- Vital Signs                                                 Height:        5'6" ---------------------------------------------------------------------- Fetal Evaluation (Fetus A)  Num Of Fetuses:         2  Fetal Heart Rate(bpm):  130  Cardiac Activity:       Observed  Fetal Lie:              Lower right Fetus  Presentation:           Cephalic  Amniotic Fluid  AFI FV:      Within normal limits                              Largest Pocket(cm)                              3.62  Comment:    Stomach and bladder noted. Breathing noted intermittently, but              not sustained. ---------------------------------------------------------------------- Biophysical Evaluation (Fetus A)  Amniotic F.V:   Within normal limits       F. Tone:        Observed  F. Movement:    Observed                   Score:          6/8  F. Breathing:   Not Observed ---------------------------------------------------------------------- Gestational Age (Fetus A)  LMP:           34w 1d        Date:  08/12/18                 EDD:   05/19/19  Best:          Weston Settle 4d     Det. By:  Previous Ultrasound      EDD:   06/13/19                                      (12/23/18) ---------------------------------------------------------------------- Anatomy (Fetus A)  Abdomen:               Umbilical vein                          varix (1.61) ---------------------------------------------------------------------- Doppler - Fetal Vessels (Fetus A)  Umbilical Artery   S/D     %tile     RI              PI                     ADFV    RDFV  1.98         7  0.49  0.68                        No      No ---------------------------------------------------------------------- Fetal Evaluation (Fetus B)  Num Of Fetuses:         2  Fetal Heart Rate(bpm):  140  Cardiac Activity:       Observed  Fetal Lie:              Upper left Fetus  Presentation:           Cephalic  Amniotic Fluid  AFI FV:      Within normal limits                              Largest Pocket(cm)                              5.02  Comment:    Stomach and bladder noted. ---------------------------------------------------------------------- Biophysical Evaluation (Fetus B)  Amniotic F.V:   Within normal limits       F. Tone:        Observed  F. Movement:    Observed                   Score:          8/8  F. Breathing:   Observed ---------------------------------------------------------------------- Gestational Age (Fetus B)  LMP:           34w 1d        Date:  08/12/18                 EDD:   05/19/19  Best:          Weston Settle 4d     Det. By:  Previous Ultrasound      EDD:   06/13/19                                      (12/23/18) ---------------------------------------------------------------------- Doppler - Fetal Vessels (Fetus B)  Umbilical Artery   S/D     %tile     RI              PI  4.15       95   0.76             1.26 ---------------------------------------------------------------------- Impression  Biophysical profile 6/8 for A- improved amniotic fluid volume  and umbilical vein varix  Biophysical profile 8/8 B ---------------------------------------------------------------------- Recommendations  Mangement per inpatient team  Please reiterate pediatric echocardiogram appointment on  04/11/19. ----------------------------------------------------------------------                Sander Nephew, MD Electronically Signed Final Report   04/08/2019 10:33 am ----------------------------------------------------------------------  [place] Mfm Fetal Bpp Wo Nst Addl Gestation  Result Date: 04/05/2019 ----------------------------------------------------------------------  OBSTETRICS REPORT                       (Signed Final 04/05/2019 03:28 pm) ---------------------------------------------------------------------- Patient Info  ID #:       [id number]                          D.O.B.:  13-Feb-1986 (32 yrs)  Name:       Rosanna Randy  Visit Date: 04/05/2019 01:20 pm ---------------------------------------------------------------------- Performed By  Performed By:     Novella Rob        Ref. Address:     Faculty                    RDMS  Attending:        Sander Nephew      [facility]                    MD                                       Fetal Care  Referred By:      Mora Bellman MD ---------------------------------------------------------------------- Orders   #  Description                          Code         Ordered By   1  [place] MFM UA CORD DOPPLER               16109.60     Sander Nephew   2  [place] MFM UA DOPPLER ADDL               45409.81     [place]   3  [place] MFM FETAL BPP WO NON              76819.01     [place]   4  [place] MFM FETAL BPP WO NST              19147.8      [place]      ADDL GESTATION                                    BOOKER  ----------------------------------------------------------------------   #  Order #                    Accession #                 Episode #   1  [id number]   2  [id number]                  [id number]   3  [id number]                   [id number]   4  [id number]                  [id number]  ---------------------------------------------------------------------- Indications   Drug use complicating pregnancy, 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)   Poor obstetric history: Previous preterm       O09.219   delivery, antepartum (x2 [id number])   Twin pregnancy, di/di, third trimester         O30.043   Maternal care for known or suspected poor      O36.5931   fetal growth, third trimester, fetus 1   [redacted] weeks gestation of pregnancy                Z3A.30  ---------------------------------------------------------------------- Vital Signs  Weight (lb): 172                               Height:        5'6"  BMI:         27.76 ---------------------------------------------------------------------- Fetal Evaluation (Fetus A)  Num Of Fetuses:         2  Fetal Heart Rate(bpm):  117  Cardiac Activity:       Bradycardia  Fetal Lie:              Lower Fetus  Presentation:           Cephalic  Placenta:               Posterior  Amniotic Fluid  AFI FV:      Oligohydramnios                              Largest Pocket(cm)                              1.78 ---------------------------------------------------------------------- Biophysical Evaluation (Fetus A)  Amniotic F.V:   Oligohydramnios            F. Tone:        Not Observed  F. Movement:    Not Observed               Score:          0/8  F. Breathing:   Not Observed ---------------------------------------------------------------------- Gestational Age (Fetus A)  LMP:           33w 5d        Date:  08/12/18                 EDD:   05/19/19  Best:          30w 1d     Det. By:  Previous Ultrasound      EDD:   06/13/19                                      (  12/23/18) ---------------------------------------------------------------------- Anatomy (Fetus A)  Thoracic:              Pericardial            Stomach:                Appears normal, left                          effusion                                                                        sided  Heart:                 Cardiomegaly           Bladder:                Appears normal ---------------------------------------------------------------------- Doppler - Fetal Vessels (Fetus A)  Umbilical Artery   S/D     %tile  3.14       64 ---------------------------------------------------------------------- Fetal Evaluation (Fetus B)  Num Of Fetuses:         2  Fetal Heart Rate(bpm):  144  Cardiac Activity:       Observed  Fetal Lie:              Upper Fetus  Presentation:           Cephalic  Placenta:               Anterior  Amniotic Fluid  AFI FV:      Within normal limits                              Largest Pocket(cm)                              4.94 ---------------------------------------------------------------------- Biophysical Evaluation (Fetus B)  Amniotic F.V:   Within normal limits       F. Tone:        Observed  F. Movement:    Observed                   Score:          8/8  F. Breathing:   Observed ---------------------------------------------------------------------- Gestational Age (Fetus B)  LMP:           33w 5d        Date:  08/12/18                 EDD:   05/19/19  Best:          30w 1d     Det. By:  Previous Ultrasound      EDD:   06/13/19                                      (12/23/18) ---------------------------------------------------------------------- Anatomy (Fetus B)  Stomach:               Appears normal, left   Bladder:  Appears normal                         sided ---------------------------------------------------------------------- Doppler - Fetal Vessels (Fetus B)  Umbilical Artery   S/D     %tile  6.03    > 97.5 ---------------------------------------------------------------------- Cervix Uterus Adnexa  Cervix  Not visualized (advanced GA >24wks) ---------------------------------------------------------------------- Comments  I met with Ms. Divis to discuss  today's finding of a BPP of  0/8 for Twin A with elevated Dopplers in Twin B. Twin A has  low amniotic fluid , no movement, tone or breathing. We  discussed that a BPP can be inaccuarate at this gestational  and that further evaluation and monitoring should happe  immediately. Secondly, we discussed the benefits and risks  of preterm delivery to avoid perinatal mortality in Twin A but  the associated preterm complications on otherwise healthy  Twin B. At this time I recommended further monitoring on  L&D with NICU consultation. Ms. Schreckengost expressed a desire  for cesarean delivery if there is any risk to Twin A. She feels  comfortable with the possible risk to Twin B.  I discussed this case with Dr. Rosana Hoes. ---------------------------------------------------------------------- Impression  Twin A IUGR with BPP 0/8  Twin B elevated UA Dopplers  Diamniotic Twin pregnancy. ---------------------------------------------------------------------- Recommendations  To L&D for admission possible delivery. ----------------------------------------------------------------------               Sander Nephew, MD Electronically Signed Final Report   04/05/2019 03:28 pm ----------------------------------------------------------------------   MAU Course  Procedures  MDM -r/o PPROM -Fern: negative -Amnisure: negative -SSE: no pooling or fluid coming from cervical os -EFM unable to maintain continuous tracing for evaluation, sending for BPP -BPP: Twin A - 6/8 (-2 for mvmt) w/ subjectively low AFI; Twin B - 8/8 -spoke with Dr. Harolyn Rutherford '@[id number]'$  - pt OK to be discharged home with strict movement precautions and reminder of appt tomorrow -pt discharged to home in stable condition  Orders Placed This Encounter  Procedures  . [place] MFM FETAL BPP WO NON STRESS    Di-Di Twins    Standing Status:   Standing    Number of Occurrences:   1    Order Specific Question:   Symptom/Reason for Exam    Answer:   Non-reactive NST  (non-stress test) [683419]  . [place] MFM FETAL BPP WO NST ADDL GESTATION    Standing Status:   Standing    Number of Occurrences:   1    Order Specific Question:   Symptom/Reason for Exam    Answer:   Non-reactive NST (non-stress test) [622297]  . Amnisure rupture of membrane (rom)not at [facility]    Standing Status:   Standing    Number of Occurrences:   1  . Contraction - monitoring    Standing Status:   Standing    Number of Occurrences:   1  . External fetal heart monitoring    Standing Status:   Standing    Number of Occurrences:   1  . POCT fern test    If suspected rupture of membranes and/or if amniotic fluid leakage is present    Standing Status:   Standing    Number of Occurrences:   1  . Discharge patient    Order Specific Question:   Discharge disposition    Answer:   01-Home or Self Care [1]    Order Specific Question:   Discharge patient date    Answer:  04/11/2019   No orders of the defined types were placed in this encounter.  Assessment and Plan   1. Encounter for suspected premature rupture of amniotic membranes, with rupture of membranes not found   2. Non-reactive NST (non-stress test)   3. [redacted] weeks gestation of pregnancy   4. Dichorionic diamniotic twin pregnancy in third trimester    Allergies as of 04/11/2019   No Known Allergies     Medication List    TAKE these medications   Comfort Fit Maternity Supp Med Misc Use belt daily   folic acid 1 MG tablet Commonly known as:  FOLVITE Take 1 tablet (1 mg total) by mouth daily.   PrePLUS 27-1 MG Tabs Take 1 tablet by mouth daily.      -strict movement precautions/return MAU precautions given -pt reminded of appt time tomorrow and strongly encouraged to keep all appts -pt discharged to home in stable condition  Gerrie Nordmann Lane Kjos 04/11/2019, 8:47 PM

## 2019-04-12 ENCOUNTER — Ambulatory Visit (HOSPITAL_COMMUNITY): Payer: Medicaid Other

## 2019-04-12 ENCOUNTER — Encounter: Payer: Self-pay | Admitting: Obstetrics and Gynecology

## 2019-04-12 ENCOUNTER — Encounter (HOSPITAL_COMMUNITY): Payer: Self-pay

## 2019-04-12 ENCOUNTER — Ambulatory Visit (INDEPENDENT_AMBULATORY_CARE_PROVIDER_SITE_OTHER): Payer: Medicaid Other | Admitting: Obstetrics and Gynecology

## 2019-04-12 VITALS — BP 102/62 | HR 94 | Temp 98.5°F | Wt 176.4 lb

## 2019-04-12 DIAGNOSIS — O09213 Supervision of pregnancy with history of pre-term labor, third trimester: Secondary | ICD-10-CM

## 2019-04-12 DIAGNOSIS — O09219 Supervision of pregnancy with history of pre-term labor, unspecified trimester: Secondary | ICD-10-CM

## 2019-04-12 DIAGNOSIS — O283 Abnormal ultrasonic finding on antenatal screening of mother: Secondary | ICD-10-CM

## 2019-04-12 DIAGNOSIS — O09899 Supervision of other high risk pregnancies, unspecified trimester: Secondary | ICD-10-CM

## 2019-04-12 DIAGNOSIS — O30043 Twin pregnancy, dichorionic/diamniotic, third trimester: Secondary | ICD-10-CM

## 2019-04-12 DIAGNOSIS — Z8619 Personal history of other infectious and parasitic diseases: Secondary | ICD-10-CM

## 2019-04-12 DIAGNOSIS — O0990 Supervision of high risk pregnancy, unspecified, unspecified trimester: Secondary | ICD-10-CM

## 2019-04-12 DIAGNOSIS — Z3A31 31 weeks gestation of pregnancy: Secondary | ICD-10-CM

## 2019-04-12 DIAGNOSIS — Z3009 Encounter for other general counseling and advice on contraception: Secondary | ICD-10-CM

## 2019-04-12 DIAGNOSIS — O30049 Twin pregnancy, dichorionic/diamniotic, unspecified trimester: Secondary | ICD-10-CM

## 2019-04-12 DIAGNOSIS — O0993 Supervision of high risk pregnancy, unspecified, third trimester: Secondary | ICD-10-CM

## 2019-04-12 NOTE — Progress Notes (Signed)
Subjective:  Christy West is a 32 y.o. S2L9532 at [redacted]w[redacted]d being seen today for ongoing prenatal care.  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  Patient reports general disomforts of twin gestation.  Contractions: Irritability. Vag. Bleeding: None.  Movement: Present. Denies leaking of fluid.   The following portions of the patient's history were reviewed and updated as appropriate: allergies, current medications, past family history, past medical history, past social history, past surgical history and problem list. Problem list updated.  Objective:   Vitals:   04/12/19 1141  BP: 102/62  Pulse: 94  Temp: 98.5 F (36.9 C)  Weight: 176 lb 6.4 oz (80 kg)    Fetal Status: Fetal Heart Rate (bpm): 131/137   Movement: Present     General:  Alert, oriented and cooperative. Patient is in no acute distress.  Skin: Skin is warm and dry. No rash noted.   Cardiovascular: Normal heart rate noted  Respiratory: Normal respiratory effort, no problems with respiration noted  Abdomen: Soft, gravid, appropriate for gestational age. Pain/Pressure: Present     Pelvic:  Cervical exam deferred        Extremities: Normal range of motion.  Edema: Trace  Mental Status: Normal mood and affect. Normal behavior. Normal judgment and thought content.   Urinalysis:      Assessment and Plan:  Pregnancy: [id number] at [redacted]w[redacted]d  1. Supervision of high risk pregnancy, antepartum Stable  2. Dichorionic diamniotic twin pregnancy, antepartum Stable Hospitalized 4/24-4/27 for DFM Rescue BMZ F/U Doppler studies next week  3.  Fetus A ?ASD ECHO yesterday Large PFO Tw A  F/U ECHO after delivery  4. History of preterm delivery, currently pregnant Stable No meds   5. Unwanted fertility BTL papers signed 03/14/19  6. History of ELISA positive for HSV Start suppressive therapy at later date  Preterm labor symptoms and general obstetric precautions including but not limited to vaginal bleeding, contractions, leaking of fluid and fetal movement were reviewed in detail with the patient. Please refer to After Visit Summary for other counseling recommendations.  Return in about 1 week (around 04/19/2019) for OB visit, face to face.   Hermina Staggers, MD

## 2019-04-12 NOTE — Patient Instructions (Signed)
Third Trimester of Pregnancy The third trimester is from week 28 through week 40 (months 7 through 9). The third trimester is a time when the unborn baby (fetus) is growing rapidly. At the end of the ninth month, the fetus is about 20 inches in length and weighs 6-10 pounds. Body changes during your third trimester Your body will continue to go through many changes during pregnancy. The changes vary from woman to woman. During the third trimester:  Your weight will continue to increase. You can expect to gain 25-35 pounds (11-16 kg) by the end of the pregnancy.  You may begin to get stretch marks on your hips, abdomen, and breasts.  You may urinate more often because the fetus is moving lower into your pelvis and pressing on your bladder.  You may develop or continue to have heartburn. This is caused by increased hormones that slow down muscles in the digestive tract.  You may develop or continue to have constipation because increased hormones slow digestion and cause the muscles that push waste through your intestines to relax.  You may develop hemorrhoids. These are swollen veins (varicose veins) in the rectum that can itch or be painful.  You may develop swollen, bulging veins (varicose veins) in your legs.  You may have increased body aches in the pelvis, back, or thighs. This is due to weight gain and increased hormones that are relaxing your joints.  You may have changes in your hair. These can include thickening of your hair, rapid growth, and changes in texture. Some women also have hair loss during or after pregnancy, or hair that feels dry or thin. Your hair will most likely return to normal after your baby is born.  Your breasts will continue to grow and they will continue to become tender. A yellow fluid (colostrum) may leak from your breasts. This is the first milk you are producing for your baby.  Your belly button may stick out.  You may notice more swelling in your hands,  face, or ankles.  You may have increased tingling or numbness in your hands, arms, and legs. The skin on your belly may also feel numb.  You may feel short of breath because of your expanding uterus.  You may have more problems sleeping. This can be caused by the size of your belly, increased need to urinate, and an increase in your body's metabolism.  You may notice the fetus "dropping," or moving lower in your abdomen (lightening).  You may have increased vaginal discharge.  You may notice your joints feel loose and you may have pain around your pelvic bone. What to expect at prenatal visits You will have prenatal exams every 2 weeks until week 36. Then you will have weekly prenatal exams. During a routine prenatal visit:  You will be weighed to make sure you and the baby are growing normally.  Your blood pressure will be taken.  Your abdomen will be measured to track your baby's growth.  The fetal heartbeat will be listened to.  Any test results from the previous visit will be discussed.  You may have a cervical check near your due date to see if your cervix has softened or thinned (effaced).  You will be tested for Group B streptococcus. This happens between 35 and 37 weeks. Your health care provider may ask you:  What your birth plan is.  How you are feeling.  If you are feeling the baby move.  If you have had any abnormal   symptoms, such as leaking fluid, bleeding, severe headaches, or abdominal cramping.  If you are using any tobacco products, including cigarettes, chewing tobacco, and electronic cigarettes.  If you have any questions. Other tests or screenings that may be performed during your third trimester include:  Blood tests that check for low iron levels (anemia).  Fetal testing to check the health, activity level, and growth of the fetus. Testing is done if you have certain medical conditions or if there are problems during the pregnancy.  Nonstress test  (NST). This test checks the health of your baby to make sure there are no signs of problems, such as the baby not getting enough oxygen. During this test, a belt is placed around your belly. The baby is made to move, and its heart rate is monitored during movement. What is false labor? False labor is a condition in which you feel small, irregular tightenings of the muscles in the womb (contractions) that usually go away with rest, changing position, or drinking water. These are called Braxton Hicks contractions. Contractions may last for hours, days, or even weeks before true labor sets in. If contractions come at regular intervals, become more frequent, increase in intensity, or become painful, you should see your health care provider. What are the signs of labor?  Abdominal cramps.  Regular contractions that start at 10 minutes apart and become stronger and more frequent with time.  Contractions that start on the top of the uterus and spread down to the lower abdomen and back.  Increased pelvic pressure and dull back pain.  A watery or bloody mucus discharge that comes from the vagina.  Leaking of amniotic fluid. This is also known as your "water breaking." It could be a slow trickle or a gush. Let your health care provider know if it has a color or strange odor. If you have any of these signs, call your health care provider right away, even if it is before your due date. Follow these instructions at home: Medicines  Follow your health care provider's instructions regarding medicine use. Specific medicines may be either safe or unsafe to take during pregnancy.  Take a prenatal vitamin that contains at least 600 micrograms (mcg) of folic acid.  If you develop constipation, try taking a stool softener if your health care provider approves. Eating and drinking   Eat a balanced diet that includes fresh fruits and vegetables, whole grains, good sources of protein such as meat, eggs, or tofu,  and low-fat dairy. Your health care provider will help you determine the amount of weight gain that is right for you.  Avoid raw meat and uncooked cheese. These carry germs that can cause birth defects in the baby.  If you have low calcium intake from food, talk to your health care provider about whether you should take a daily calcium supplement.  Eat four or five small meals rather than three large meals a day.  Limit foods that are high in fat and processed sugars, such as fried and sweet foods.  To prevent constipation: ? Drink enough fluid to keep your urine clear or pale yellow. ? Eat foods that are high in fiber, such as fresh fruits and vegetables, whole grains, and beans. Activity  Exercise only as directed by your health care provider. Most women can continue their usual exercise routine during pregnancy. Try to exercise for 30 minutes at least 5 days a week. Stop exercising if you experience uterine contractions.  Avoid heavy lifting.  Do   not exercise in extreme heat or humidity, or at high altitudes.  Wear low-heel, comfortable shoes.  Practice good posture.  You may continue to have sex unless your health care provider tells you otherwise. Relieving pain and discomfort  Take frequent breaks and rest with your legs elevated if you have leg cramps or low back pain.  Take warm sitz baths to soothe any pain or discomfort caused by hemorrhoids. Use hemorrhoid cream if your health care provider approves.  Wear a good support bra to prevent discomfort from breast tenderness.  If you develop varicose veins: ? Wear support pantyhose or compression stockings as told by your healthcare provider. ? Elevate your feet for 15 minutes, 3-4 times a day. Prenatal care  Write down your questions. Take them to your prenatal visits.  Keep all your prenatal visits as told by your health care provider. This is important. Safety  Wear your seat belt at all times when driving.  Make  a list of emergency phone numbers, including numbers for family, friends, the hospital, and police and fire departments. General instructions  Avoid cat litter boxes and soil used by cats. These carry germs that can cause birth defects in the baby. If you have a cat, ask someone to clean the litter box for you.  Do not travel far distances unless it is absolutely necessary and only with the approval of your health care provider.  Do not use hot tubs, steam rooms, or saunas.  Do not drink alcohol.  Do not use any products that contain nicotine or tobacco, such as cigarettes and e-cigarettes. If you need help quitting, ask your health care provider.  Do not use any medicinal herbs or unprescribed drugs. These chemicals affect the formation and growth of the baby.  Do not douche or use tampons or scented sanitary pads.  Do not cross your legs for long periods of time.  To prepare for the arrival of your baby: ? Take prenatal classes to understand, practice, and ask questions about labor and delivery. ? Make a trial run to the hospital. ? Visit the hospital and tour the maternity area. ? Arrange for maternity or paternity leave through employers. ? Arrange for family and friends to take care of pets while you are in the hospital. ? Purchase a rear-facing car seat and make sure you know how to install it in your car. ? Pack your hospital bag. ? Prepare the baby's nursery. Make sure to remove all pillows and stuffed animals from the baby's crib to prevent suffocation.  Visit your dentist if you have not gone during your pregnancy. Use a soft toothbrush to brush your teeth and be gentle when you floss. Contact a health care provider if:  You are unsure if you are in labor or if your water has broken.  You become dizzy.  You have mild pelvic cramps, pelvic pressure, or nagging pain in your abdominal area.  You have lower back pain.  You have persistent nausea, vomiting, or  diarrhea.  You have an unusual or bad smelling vaginal discharge.  You have pain when you urinate. Get help right away if:  Your water breaks before 37 weeks.  You have regular contractions less than 5 minutes apart before 37 weeks.  You have a fever.  You are leaking fluid from your vagina.  You have spotting or bleeding from your vagina.  You have severe abdominal pain or cramping.  You have rapid weight loss or weight gain.  You have   shortness of breath with chest pain.  You notice sudden or extreme swelling of your face, hands, ankles, feet, or legs.  Your baby makes fewer than 10 movements in 2 hours.  You have severe headaches that do not go away when you take medicine.  You have vision changes. Summary  The third trimester is from week 28 through week 40, months 7 through 9. The third trimester is a time when the unborn baby (fetus) is growing rapidly.  During the third trimester, your discomfort may increase as you and your baby continue to gain weight. You may have abdominal, leg, and back pain, sleeping problems, and an increased need to urinate.  During the third trimester your breasts will keep growing and they will continue to become tender. A yellow fluid (colostrum) may leak from your breasts. This is the first milk you are producing for your baby.  False labor is a condition in which you feel small, irregular tightenings of the muscles in the womb (contractions) that eventually go away. These are called Braxton Hicks contractions. Contractions may last for hours, days, or even weeks before true labor sets in.  Signs of labor can include: abdominal cramps; regular contractions that start at 10 minutes apart and become stronger and more frequent with time; watery or bloody mucus discharge that comes from the vagina; increased pelvic pressure and dull back pain; and leaking of amniotic fluid. This information is not intended to replace advice given to you by your  health care provider. Make sure you discuss any questions you have with your health care provider. Document Released: 11/22/2001 Document Revised: 01/03/2017 Document Reviewed: 01/03/2017 [employer] Interactive Patient Education  2019 [employer]  

## 2019-04-16 ENCOUNTER — Encounter (HOSPITAL_COMMUNITY): Payer: Self-pay

## 2019-04-16 ENCOUNTER — Ambulatory Visit (HOSPITAL_COMMUNITY)
Admission: RE | Admit: 2019-04-16 | Discharge: 2019-04-16 | Disposition: A | Discharge status: Home / Self Care | Payer: Medicaid Other | Source: Ambulatory Visit | Attending: Maternal & Fetal Medicine | Admitting: Maternal & Fetal Medicine

## 2019-04-16 ENCOUNTER — Other Ambulatory Visit: Payer: Self-pay

## 2019-04-16 ENCOUNTER — Ambulatory Visit (HOSPITAL_COMMUNITY): Payer: Medicaid Other | Admitting: *Deleted

## 2019-04-16 VITALS — BP 121/67 | HR 88 | Temp 97.9°F | Wt 174.0 lb

## 2019-04-16 DIAGNOSIS — O30049 Twin pregnancy, dichorionic/diamniotic, unspecified trimester: Secondary | ICD-10-CM | POA: Diagnosis present

## 2019-04-16 DIAGNOSIS — O99013 Anemia complicating pregnancy, third trimester: Secondary | ICD-10-CM

## 2019-04-16 DIAGNOSIS — O365931 Maternal care for other known or suspected poor fetal growth, third trimester, fetus 1: Secondary | ICD-10-CM | POA: Insufficient documentation

## 2019-04-16 DIAGNOSIS — O0990 Supervision of high risk pregnancy, unspecified, unspecified trimester: Secondary | ICD-10-CM | POA: Diagnosis present

## 2019-04-16 DIAGNOSIS — Z3A31 31 weeks gestation of pregnancy: Secondary | ICD-10-CM

## 2019-04-22 ENCOUNTER — Other Ambulatory Visit: Payer: Self-pay

## 2019-04-22 ENCOUNTER — Ambulatory Visit (HOSPITAL_COMMUNITY)
Admission: RE | Admit: 2019-04-22 | Discharge: 2019-04-22 | Disposition: A | Discharge status: Home / Self Care | Payer: Medicaid Other | Source: Ambulatory Visit | Attending: Obstetrics and Gynecology | Admitting: Obstetrics and Gynecology

## 2019-04-22 ENCOUNTER — Telehealth: Payer: Self-pay | Admitting: Obstetrics & Gynecology

## 2019-04-22 ENCOUNTER — Other Ambulatory Visit (HOSPITAL_COMMUNITY): Payer: Self-pay | Admitting: Obstetrics and Gynecology

## 2019-04-22 ENCOUNTER — Ambulatory Visit (INDEPENDENT_AMBULATORY_CARE_PROVIDER_SITE_OTHER): Payer: Medicaid Other | Admitting: Obstetrics & Gynecology

## 2019-04-22 ENCOUNTER — Ambulatory Visit (HOSPITAL_COMMUNITY): Payer: Medicaid Other | Admitting: *Deleted

## 2019-04-22 VITALS — BP 111/69 | HR 86 | Temp 98.0°F | Wt 180.4 lb

## 2019-04-22 DIAGNOSIS — O99013 Anemia complicating pregnancy, third trimester: Secondary | ICD-10-CM

## 2019-04-22 DIAGNOSIS — O30049 Twin pregnancy, dichorionic/diamniotic, unspecified trimester: Secondary | ICD-10-CM

## 2019-04-22 DIAGNOSIS — O30043 Twin pregnancy, dichorionic/diamniotic, third trimester: Secondary | ICD-10-CM | POA: Diagnosis not present

## 2019-04-22 DIAGNOSIS — O365931 Maternal care for other known or suspected poor fetal growth, third trimester, fetus 1: Secondary | ICD-10-CM | POA: Diagnosis not present

## 2019-04-22 DIAGNOSIS — O0990 Supervision of high risk pregnancy, unspecified, unspecified trimester: Secondary | ICD-10-CM | POA: Diagnosis present

## 2019-04-22 DIAGNOSIS — O99323 Drug use complicating pregnancy, third trimester: Secondary | ICD-10-CM | POA: Diagnosis not present

## 2019-04-22 DIAGNOSIS — Z3A32 32 weeks gestation of pregnancy: Secondary | ICD-10-CM

## 2019-04-22 DIAGNOSIS — Z8619 Personal history of other infectious and parasitic diseases: Secondary | ICD-10-CM

## 2019-04-22 DIAGNOSIS — O09213 Supervision of pregnancy with history of pre-term labor, third trimester: Secondary | ICD-10-CM

## 2019-04-22 DIAGNOSIS — O09899 Supervision of other high risk pregnancies, unspecified trimester: Secondary | ICD-10-CM

## 2019-04-22 DIAGNOSIS — Z3009 Encounter for other general counseling and advice on contraception: Secondary | ICD-10-CM

## 2019-04-22 DIAGNOSIS — Z362 Encounter for other antenatal screening follow-up: Secondary | ICD-10-CM | POA: Diagnosis not present

## 2019-04-22 MED ORDER — METRONIDAZOLE 500 MG PO TABS: 500.0000 mg | ORAL_TABLET | Freq: Two times a day (BID) | ORAL | 0 refills | Status: DC

## 2019-04-22 MED ORDER — FLUCONAZOLE 150 MG PO TABS: 150.0000 mg | ORAL_TABLET | Freq: Once | ORAL | 3 refills | Status: AC

## 2019-04-22 MED ORDER — VALACYCLOVIR HCL 500 MG PO TABS: 500.0000 mg | ORAL_TABLET | Freq: Two times a day (BID) | ORAL | 6 refills | Status: DC

## 2019-04-22 NOTE — Progress Notes (Signed)
   PRENATAL VISIT NOTE  Subjective:  Christy West is a 33 y.o. [phone] at [redacted]w[redacted]d being seen today for ongoing prenatal care.  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  Patient reports no complaints.   .  .   . Denies leaking of fluid.   The following portions of the patient's history were reviewed and updated as appropriate: allergies, current medications, past family history, past medical history, past social history, past surgical history and problem list.   Objective:  There were no vitals filed for this visit.  Fetal Status:           General:  Alert, oriented and cooperative. Patient is in no acute distress.  Skin: Skin is warm and dry. No rash noted.   Cardiovascular: Normal heart rate noted  Respiratory: Normal respiratory effort, no problems with respiration noted  Abdomen: Soft, gravid, appropriate for gestational age.        Pelvic: Cervical exam deferred        Extremities: Normal range of motion.     Mental Status: Normal mood and affect. Normal behavior. Normal judgment and thought content.   Assessment and Plan:  Pregnancy: [id number] at [redacted]w[redacted]d 1. Unwanted fertility   2. History of ELISA positive for HSV - rec prevention, valtrex prescribed - she reports a recent out break about 3 days ago  3. History of preterm delivery, currently pregnant   4. Anemia affecting pregnancy in third trimester - was 11.1 recently  5. Dichorionic diamniotic twin pregnancy, antepartum  - [place] MFM OB FOLLOW UP ADDL GEST; Future - [place] MFM OB FOLLOW UP; Future - weekly cord dopplers  6.  Supervision of high risk pregnancy, antepartum - she says that she never picked up her prescription for flagyl and she thinks that she will want a diflucan after she takes the flagyl. I have prescribed both for her  Preterm labor symptoms and general obstetric precautions including but not limited to vaginal bleeding, contractions, leaking of fluid and fetal movement were reviewed in detail with the patient. Please refer to After Visit Summary for other counseling recommendations.   No follow-ups on file.  No future appointments.  Allie Bossier, MD

## 2019-04-22 NOTE — Telephone Encounter (Signed)
First call attempt on the mobile number her grandmother stated her and her children are living with her father and she does not have her new number.  Second attempt on the home line called the patient to confirm the appointment, left a detailed voicemail message with our new location and appointment time.

## 2019-04-23 ENCOUNTER — Other Ambulatory Visit (HOSPITAL_COMMUNITY): Payer: Self-pay | Admitting: *Deleted

## 2019-04-23 DIAGNOSIS — O365931 Maternal care for other known or suspected poor fetal growth, third trimester, fetus 1: Secondary | ICD-10-CM

## 2019-04-24 ENCOUNTER — Encounter (HOSPITAL_COMMUNITY): Payer: Self-pay

## 2019-04-29 ENCOUNTER — Other Ambulatory Visit: Payer: Self-pay

## 2019-04-29 ENCOUNTER — Ambulatory Visit (HOSPITAL_COMMUNITY): Payer: Medicaid Other | Admitting: *Deleted

## 2019-04-29 ENCOUNTER — Ambulatory Visit (HOSPITAL_COMMUNITY)
Admission: RE | Admit: 2019-04-29 | Discharge: 2019-04-29 | Disposition: A | Discharge status: Home / Self Care | Payer: Medicaid Other | Source: Ambulatory Visit | Attending: Obstetrics and Gynecology | Admitting: Obstetrics and Gynecology

## 2019-04-29 DIAGNOSIS — O09213 Supervision of pregnancy with history of pre-term labor, third trimester: Secondary | ICD-10-CM

## 2019-04-29 DIAGNOSIS — O30049 Twin pregnancy, dichorionic/diamniotic, unspecified trimester: Secondary | ICD-10-CM | POA: Insufficient documentation

## 2019-04-29 DIAGNOSIS — Z3A33 33 weeks gestation of pregnancy: Secondary | ICD-10-CM

## 2019-04-29 DIAGNOSIS — O365931 Maternal care for other known or suspected poor fetal growth, third trimester, fetus 1: Secondary | ICD-10-CM | POA: Diagnosis present

## 2019-04-29 DIAGNOSIS — O99323 Drug use complicating pregnancy, third trimester: Secondary | ICD-10-CM

## 2019-04-29 DIAGNOSIS — O0990 Supervision of high risk pregnancy, unspecified, unspecified trimester: Secondary | ICD-10-CM | POA: Diagnosis present

## 2019-04-29 DIAGNOSIS — O365932 Maternal care for other known or suspected poor fetal growth, third trimester, fetus 2: Secondary | ICD-10-CM

## 2019-04-29 DIAGNOSIS — O99013 Anemia complicating pregnancy, third trimester: Secondary | ICD-10-CM | POA: Diagnosis present

## 2019-04-29 DIAGNOSIS — O30043 Twin pregnancy, dichorionic/diamniotic, third trimester: Secondary | ICD-10-CM

## 2019-05-07 ENCOUNTER — Other Ambulatory Visit: Payer: Self-pay

## 2019-05-07 ENCOUNTER — Encounter (HOSPITAL_COMMUNITY): Payer: Self-pay | Admitting: *Deleted

## 2019-05-07 ENCOUNTER — Ambulatory Visit (HOSPITAL_COMMUNITY): Payer: Medicaid Other | Admitting: *Deleted

## 2019-05-07 ENCOUNTER — Ambulatory Visit (HOSPITAL_COMMUNITY)
Admission: RE | Admit: 2019-05-07 | Discharge: 2019-05-07 | Disposition: A | Discharge status: Home / Self Care | Payer: Medicaid Other | Source: Ambulatory Visit | Attending: Obstetrics and Gynecology | Admitting: Obstetrics and Gynecology

## 2019-05-07 DIAGNOSIS — O0990 Supervision of high risk pregnancy, unspecified, unspecified trimester: Secondary | ICD-10-CM | POA: Diagnosis present

## 2019-05-07 DIAGNOSIS — O99323 Drug use complicating pregnancy, third trimester: Secondary | ICD-10-CM

## 2019-05-07 DIAGNOSIS — O30043 Twin pregnancy, dichorionic/diamniotic, third trimester: Secondary | ICD-10-CM

## 2019-05-07 DIAGNOSIS — O365931 Maternal care for other known or suspected poor fetal growth, third trimester, fetus 1: Secondary | ICD-10-CM

## 2019-05-07 DIAGNOSIS — O365932 Maternal care for other known or suspected poor fetal growth, third trimester, fetus 2: Secondary | ICD-10-CM

## 2019-05-07 DIAGNOSIS — O30049 Twin pregnancy, dichorionic/diamniotic, unspecified trimester: Secondary | ICD-10-CM | POA: Insufficient documentation

## 2019-05-07 DIAGNOSIS — O99013 Anemia complicating pregnancy, third trimester: Secondary | ICD-10-CM | POA: Insufficient documentation

## 2019-05-07 DIAGNOSIS — O09213 Supervision of pregnancy with history of pre-term labor, third trimester: Secondary | ICD-10-CM

## 2019-05-07 DIAGNOSIS — Z3A34 34 weeks gestation of pregnancy: Secondary | ICD-10-CM

## 2019-05-12 ENCOUNTER — Inpatient Hospital Stay (HOSPITAL_COMMUNITY): Payer: Medicaid Other | Admitting: Anesthesiology

## 2019-05-12 ENCOUNTER — Other Ambulatory Visit: Payer: Self-pay

## 2019-05-12 ENCOUNTER — Encounter (HOSPITAL_COMMUNITY): Payer: Self-pay

## 2019-05-12 ENCOUNTER — Inpatient Hospital Stay (HOSPITAL_COMMUNITY)
Admission: AD | Admit: 2019-05-12 | Discharge: 2019-05-14 | DRG: 797 | Disposition: A | Discharge status: Home / Self Care | Payer: Medicaid Other | Attending: Family Medicine | Admitting: Family Medicine

## 2019-05-12 DIAGNOSIS — Z3A35 35 weeks gestation of pregnancy: Secondary | ICD-10-CM

## 2019-05-12 DIAGNOSIS — O328XX2 Maternal care for other malpresentation of fetus, fetus 2: Secondary | ICD-10-CM | POA: Diagnosis present

## 2019-05-12 DIAGNOSIS — O99334 Smoking (tobacco) complicating childbirth: Secondary | ICD-10-CM | POA: Diagnosis present

## 2019-05-12 DIAGNOSIS — O42913 Preterm premature rupture of membranes, unspecified as to length of time between rupture and onset of labor, third trimester: Principal | ICD-10-CM | POA: Diagnosis present

## 2019-05-12 DIAGNOSIS — O365931 Maternal care for other known or suspected poor fetal growth, third trimester, fetus 1: Secondary | ICD-10-CM | POA: Diagnosis present

## 2019-05-12 DIAGNOSIS — O4202 Full-term premature rupture of membranes, onset of labor within 24 hours of rupture: Secondary | ICD-10-CM | POA: Diagnosis not present

## 2019-05-12 DIAGNOSIS — Z1159 Encounter for screening for other viral diseases: Secondary | ICD-10-CM | POA: Diagnosis not present

## 2019-05-12 DIAGNOSIS — O4290 Premature rupture of membranes, unspecified as to length of time between rupture and onset of labor, unspecified weeks of gestation: Secondary | ICD-10-CM | POA: Diagnosis present

## 2019-05-12 DIAGNOSIS — O99013 Anemia complicating pregnancy, third trimester: Secondary | ICD-10-CM | POA: Diagnosis present

## 2019-05-12 DIAGNOSIS — O30043 Twin pregnancy, dichorionic/diamniotic, third trimester: Secondary | ICD-10-CM | POA: Diagnosis present

## 2019-05-12 DIAGNOSIS — O0990 Supervision of high risk pregnancy, unspecified, unspecified trimester: Secondary | ICD-10-CM

## 2019-05-12 DIAGNOSIS — Z302 Encounter for sterilization: Secondary | ICD-10-CM | POA: Diagnosis present

## 2019-05-12 DIAGNOSIS — O30049 Twin pregnancy, dichorionic/diamniotic, unspecified trimester: Secondary | ICD-10-CM | POA: Diagnosis present

## 2019-05-12 DIAGNOSIS — O99824 Streptococcus B carrier state complicating childbirth: Secondary | ICD-10-CM | POA: Diagnosis present

## 2019-05-12 DIAGNOSIS — D649 Anemia, unspecified: Secondary | ICD-10-CM | POA: Diagnosis present

## 2019-05-12 DIAGNOSIS — F17210 Nicotine dependence, cigarettes, uncomplicated: Secondary | ICD-10-CM | POA: Diagnosis present

## 2019-05-12 DIAGNOSIS — O9832 Other infections with a predominantly sexual mode of transmission complicating childbirth: Secondary | ICD-10-CM | POA: Diagnosis present

## 2019-05-12 DIAGNOSIS — O9902 Anemia complicating childbirth: Secondary | ICD-10-CM | POA: Diagnosis present

## 2019-05-12 DIAGNOSIS — A6000 Herpesviral infection of urogenital system, unspecified: Secondary | ICD-10-CM | POA: Diagnosis present

## 2019-05-12 DIAGNOSIS — O365990 Maternal care for other known or suspected poor fetal growth, unspecified trimester, not applicable or unspecified: Secondary | ICD-10-CM | POA: Diagnosis present

## 2019-05-12 LAB — CBC
HCT: 29.2 % — ABNORMAL LOW (ref 36.0–46.0)
Hemoglobin: 9.9 g/dL — ABNORMAL LOW (ref 12.0–15.0)
MCH: 30.8 pg (ref 26.0–34.0)
MCHC: 33.9 g/dL (ref 30.0–36.0)
MCV: 91 fL (ref 80.0–100.0)
Platelets: 237 10*3/uL (ref 150–400)
RBC: 3.21 MIL/uL — ABNORMAL LOW (ref 3.87–5.11)
RDW: 13.1 % (ref 11.5–15.5)
WBC: 5.3 10*3/uL (ref 4.0–10.5)
nRBC: 0 % (ref 0.0–0.2)

## 2019-05-12 LAB — RAPID URINE DRUG SCREEN, HOSP PERFORMED
Amphetamines: NOT DETECTED
Barbiturates: NOT DETECTED
Benzodiazepines: NOT DETECTED
Cocaine: POSITIVE — AB
Opiates: NOT DETECTED
Tetrahydrocannabinol: POSITIVE — AB

## 2019-05-12 LAB — GROUP B STREP BY PCR: Group B strep by PCR: POSITIVE — AB

## 2019-05-12 LAB — TYPE AND SCREEN
ABO/RH(D): AB POS
Antibody Screen: NEGATIVE

## 2019-05-12 LAB — SARS CORONAVIRUS 2 BY RT PCR (HOSPITAL ORDER, PERFORMED IN ~~LOC~~ HOSPITAL LAB): SARS Coronavirus 2: NEGATIVE

## 2019-05-12 MED ORDER — PENICILLIN G 3 MILLION UNITS IVPB - SIMPLE MED
3.0000 10*6.[IU] | INTRAVENOUS | Status: DC
  Filled 2019-05-12 (×7): qty 100

## 2019-05-12 MED ORDER — OXYTOCIN 40 UNITS IN NORMAL SALINE INFUSION - SIMPLE MED: 1.0000 m[IU]/min | INTRAVENOUS | Status: DC

## 2019-05-12 MED ORDER — SOD CITRATE-CITRIC ACID 500-334 MG/5ML PO SOLN: 30.0000 mL | ORAL | Status: DC | PRN

## 2019-05-12 MED ORDER — LACTATED RINGERS IV SOLN: 500.0000 mL | Freq: Once | INTRAVENOUS | Status: DC

## 2019-05-12 MED ORDER — SODIUM CHLORIDE 0.9 % IV SOLN
5.0000 10*6.[IU] | Freq: Once | INTRAVENOUS | Status: AC
  Administered 2019-05-12: 20:00:00 5 10*6.[IU] via INTRAVENOUS
  Filled 2019-05-12: qty 5

## 2019-05-12 MED ORDER — FENTANYL CITRATE (PF) 100 MCG/2ML IJ SOLN
100.0000 ug | INTRAMUSCULAR | Status: DC | PRN
  Administered 2019-05-12: 100 ug via INTRAVENOUS

## 2019-05-12 MED ORDER — ONDANSETRON HCL 4 MG/2ML IJ SOLN: 4.0000 mg | Freq: Four times a day (QID) | INTRAMUSCULAR | Status: DC | PRN

## 2019-05-12 MED ORDER — ACETAMINOPHEN 325 MG PO TABS: 650.0000 mg | ORAL_TABLET | ORAL | Status: DC | PRN

## 2019-05-12 MED ORDER — DIPHENHYDRAMINE HCL 50 MG/ML IJ SOLN: 12.5000 mg | INTRAMUSCULAR | Status: DC | PRN

## 2019-05-12 MED ORDER — FENTANYL-BUPIVACAINE-NACL 0.5-0.125-0.9 MG/250ML-% EP SOLN
12.0000 mL/h | EPIDURAL | Status: DC | PRN
  Filled 2019-05-12: qty 250

## 2019-05-12 MED ORDER — TERBUTALINE SULFATE 1 MG/ML IJ SOLN: 0.2500 mg | Freq: Once | INTRAMUSCULAR | Status: DC | PRN

## 2019-05-12 MED ORDER — EPHEDRINE 5 MG/ML INJ
10.0000 mg | INTRAVENOUS | Status: DC | PRN
  Filled 2019-05-12: qty 2

## 2019-05-12 MED ORDER — FENTANYL CITRATE (PF) 100 MCG/2ML IJ SOLN
INTRAMUSCULAR | Status: AC
  Administered 2019-05-12: 100 ug via INTRAVENOUS
  Filled 2019-05-12: qty 2

## 2019-05-12 MED ORDER — LIDOCAINE HCL (PF) 1 % IJ SOLN: 30.0000 mL | INTRAMUSCULAR | Status: DC | PRN

## 2019-05-12 MED ORDER — SODIUM CHLORIDE (PF) 0.9 % IJ SOLN
INTRAMUSCULAR | Status: DC | PRN
  Administered 2019-05-12: 12 mL/h via EPIDURAL

## 2019-05-12 MED ORDER — PHENYLEPHRINE 40 MCG/ML (10ML) SYRINGE FOR IV PUSH (FOR BLOOD PRESSURE SUPPORT)
80.0000 ug | PREFILLED_SYRINGE | INTRAVENOUS | Status: DC | PRN
  Filled 2019-05-12: qty 10

## 2019-05-12 MED ORDER — LACTATED RINGERS IV SOLN: 500.0000 mL | INTRAVENOUS | Status: DC | PRN

## 2019-05-12 MED ORDER — LIDOCAINE HCL (PF) 1 % IJ SOLN
INTRAMUSCULAR | Status: DC | PRN
  Administered 2019-05-12 (×2): 5 mL via EPIDURAL

## 2019-05-12 MED ORDER — LACTATED RINGERS IV SOLN
INTRAVENOUS | Status: DC
  Administered 2019-05-12: 19:00:00 via INTRAVENOUS

## 2019-05-12 MED ORDER — OXYTOCIN BOLUS FROM INFUSION
500.0000 mL | Freq: Once | INTRAVENOUS | Status: AC
  Administered 2019-05-12: 500 mL via INTRAVENOUS

## 2019-05-12 MED ORDER — OXYTOCIN 40 UNITS IN NORMAL SALINE INFUSION - SIMPLE MED
2.5000 [IU]/h | INTRAVENOUS | Status: DC
  Administered 2019-05-13: 2.5 [IU]/h via INTRAVENOUS
  Filled 2019-05-12: qty 1000

## 2019-05-12 NOTE — MAU Note (Signed)
Pt reports gush of fluid about 1 hour ago, and a second gush once inside MAU. Pt reports some ctx that she rates 6/10.  Pt denies vag bleeding.  Pt reports good fetal movement.

## 2019-05-12 NOTE — MAU Provider Note (Signed)
First Provider Initiated Contact with Patient 05/12/19 1826    S Ms. Christy West is a 33 y.o. M2L0786 at [redacted]w[redacted]d with di/di twin pregnancy who reports continuous leaking of fluid since 1730 today. She denies abdominal contraction pain, DFM, and vaginal bleeding  O BP 91/63   Pulse (!) 106   Temp 97.9 F (36.6 C) (Oral)   Resp 18   LMP 08/12/2018 (Approximate)   SpO2 100%      Physical Exam  Nursing note and vitals reviewed. Constitutional: She appears well-developed and well-nourished.  Cardiovascular: Normal rate.  Respiratory: Effort normal.  GI:  Gravid  Genitourinary:    Genitourinary Comments: Deferred to labor team. Patient actively leaking small to moderate amounts of clear fluid   A 32 y.o. [id number] at [redacted]w[redacted]d  Grossly ruptured at 1730 today Medical screening exam complete  Reactive tracing x 2 S/p BMZ 04/24 and 04/25 Possible ASD Baby A Baby A and Baby B both vertex on bedside ultrasound  P Admit to L&D Report called to Dr. Lucienne Minks, Roxy Cedar, CNM 05/12/2019 6:58 PM
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
## 2019-05-12 NOTE — Discharge Summary (Signed)
Postpartum Discharge Summary     Patient Name: Christy West DOB: 1986/03/29 MRN: [id number]  Date of admission: 05/12/2019 Delivering Provider:    Modena MorrowMungo, GirlA Riata [[id number]][[id number]]  Delman CheadleROGERS, VERONICA C   [place], GirlB PajarosDiamond [[id number]][[id number]]  Reva BoresPRATT, TANYA S   Date of discharge: 05/14/2019  Admitting diagnosis: water broke 35 wks Intrauterine pregnancy: 7857w3d     Secondary diagnosis:  Active Problems:   Anemia affecting pregnancy in third trimester   Dichorionic diamniotic twin pregnancy, antepartum   IUGR (intrauterine growth restriction) affecting care of mother   PROM (premature rupture of membranes)   SVD (spontaneous vaginal delivery)  Additional problems: Drug use during pregnancy and hx of HSV      Discharge diagnosis: Term Pregnancy Delivered and Anemia                                                                                                Post partum procedures:postpartum tubal ligation  Augmentation: AROM on fetus B   Complications: None  Hospital course:  Onset of Labor With Vaginal Delivery     33 y.o. yo [id number] at 5856w4d was admitted in Latent Labor on 05/12/2019. Patient had an uncomplicated labor course as follows:  Membrane Rupture Time/Date:    Modena MorrowMungo, GirlA Timia [[id number]][[id number]]  4:00 PM   Delilah ShanMungo, GirlB Rane [[id number]][[id number]]  11:18 PM ,   Modena MorrowMungo, GirlA Sanja [[id number]][[id number]]  05/12/2019   Delilah ShanMungo, GirlB Evon [[id number]][[id number]]  05/12/2019   Intrapartum Procedures: Episiotomy:    Modena MorrowMungo, GirlA Shonda [[id number]][[id number]]  None [1]   Delilah ShanMungo, GirlB Lachelle [[id number]][[id number]]  None [1]                                         Lacerations:     Modena MorrowMungo, GirlA Kalen [[id number]][[id number]]  None [1]   Delilah ShanMungo, GirlB Aalia [[id number]][[id number]]  None [1]  Patient had a delivery of a Viable infant.   Modena MorrowMungo, GirlA Valda [[id number]][[id number]]  05/12/2019   Melvern SampleMungo, PendingBaby [[id number]][[id number]]    Delilah ShanMungo, GirlB Janisha [[id number]][[id number]]  05/12/2019 Information for the patient's newborn:  Modena MorrowMungo, GirlA Melinna  [[id number]][[id number]]  Delivery Method: Vaginal, Spontaneous(Filed from Delivery Summary) Information for the patient's newborn:  Delilah ShanMungo, GirlB Angelea [[id number]][[id number]]  Delivery Method: Vaginal, Spontaneous(Filed from Delivery Summary)    Pateint had an uncomplicated postpartum course.  She is ambulating, tolerating a regular diet, passing flatus, and urinating well. Patient is discharged home in stable condition on 05/14/19.   Magnesium Sulfate recieved: No BMZ received: Yes  Physical exam  Vitals:   05/13/19 1940 05/14/19 0104 05/14/19 0530 05/14/19 0809  BP: 106/60 135/89 131/78 98/62  Pulse: (!) 59 63 70 (!) 59  Resp: 18 19 18 16   Temp: 97.9 F (36.6 C) 98.5 F (36.9 C) 98.3 F (36.8 C) 98.2 F (36.8 C)  TempSrc: Oral Oral Oral Oral  SpO2: 100% 99% 100% 98%  Weight:      Height:       General: alert, cooperative and no distress Lochia: appropriate Uterine Fundus: firm  Incision: Dressing is clean, dry, and intact at umbilical incision DVT Evaluation: No evidence of DVT seen on physical exam. Labs: Lab Results  Component Value Date   WBC 5.3 05/12/2019   HGB 9.9 (L) 05/12/2019   HCT 29.2 (L) 05/12/2019   MCV 91.0 05/12/2019   PLT 237 05/12/2019   CMP Latest Ref Rng & Units 01/26/2018  Glucose 65 - 99 mg/dL 99  BUN 6 - 20 mg/dL 5(L)  Creatinine 7.01 - 1.00 mg/dL 7.79  Sodium 390 - 300 mmol/L 140  Potassium 3.5 - 5.1 mmol/L 3.6  Chloride 101 - 111 mmol/L 107  CO2 22 - 32 mmol/L 21(L)  Calcium 8.9 - 10.3 mg/dL 9.3  Total Protein 6.5 - 8.1 g/dL 9.2(Z)  Total Bilirubin 0.3 - 1.2 mg/dL 0.4  Alkaline Phos 38 - 126 U/L 47  AST 15 - 41 U/L 174(H)  ALT 14 - 54 U/L 94(H)    Discharge instruction: per After Visit Summary and "Baby and Me Booklet".  After visit meds:  Allergies as of 05/14/2019   No Known Allergies     Medication List    STOP taking these medications   Comfort Fit Maternity Supp Med Misc   folic acid 1 MG tablet Commonly known as:  FOLVITE   metroNIDAZOLE  500 MG tablet Commonly known as:  FLAGYL     TAKE these medications   ibuprofen 600 MG tablet Commonly known as:  ADVIL Take 1 tablet (600 mg total) by mouth every 6 (six) hours.   oxyCODONE-acetaminophen 5-325 MG tablet Commonly known as:  PERCOCET/ROXICET Take 1 tablet by mouth every 4 (four) hours as needed for moderate pain or severe pain.   PrePLUS 27-1 MG Tabs Take 1 tablet by mouth daily.   valACYclovir 500 MG tablet Commonly known as:  VALTREX Take 1 tablet (500 mg total) by mouth 2 (two) times daily.       Diet: routine diet  Activity: Advance as tolerated. Pelvic rest for 6 weeks.   Outpatient follow up:4 weeks Follow up Appt: No future appointments. Follow up Visit:  Please schedule this patient for Postpartum visit in: 4 weeks with the following provider: Any provider For C/S patients schedule nurse incision check in weeks 2 weeks: no High risk pregnancy complicated by: di/di twin pregnancy  Delivery mode:  SVD Anticipated Birth Control:  BTL done PP PP Procedures needed: none  Schedule Integrated BH visit: no   Newborn Data:   Deondria, Berton [[id number]]  Live born female  Birth Weight:   APGAR: 8,   Newborn Delivery   Birth date/time:  05/12/2019 23:15:00 Delivery type:  Vaginal, Spontaneous      Semaya, Benninger [[id number]]  Live born adult  Birth Weight:   APGAR: ,   Newborn Delivery   Birth date/time:   Delivery type:        Naraly, Goodloe [[id number]]  Live born female  Birth Weight:   APGAR: ,   Newborn Delivery   Birth date/time:  05/12/2019 23:28:00 Delivery type:  Vaginal, Spontaneous     Baby Feeding: Bottle and Breast Disposition:NICU   05/14/2019 Catalina Antigua, MD

## 2019-05-12 NOTE — Anesthesia Preprocedure Evaluation (Signed)
Anesthesia Evaluation  Patient identified by MRN, date of birth, ID band Patient awake    Reviewed: Allergy & Precautions, H&P , NPO status , Patient's Chart, lab work & pertinent test results  Airway Mallampati: II   Neck ROM: full    Dental   Pulmonary Current Smoker,    breath sounds clear to auscultation       Cardiovascular negative cardio ROS   Rhythm:regular Rate:Normal     Neuro/Psych    GI/Hepatic   Endo/Other    Renal/GU      Musculoskeletal   Abdominal   Peds  Hematology  (+) Blood dyscrasia, anemia ,   Anesthesia Other Findings   Reproductive/Obstetrics (+) Pregnancy                             Anesthesia Physical Anesthesia Plan  ASA: II  Anesthesia Plan: Epidural   Post-op Pain Management:    Induction: Intravenous  PONV Risk Score and Plan: 1 and Treatment may vary due to age or medical condition  Airway Management Planned: Natural Airway  Additional Equipment:   Intra-op Plan:   Post-operative Plan:   Informed Consent: I have reviewed the patients History and Physical, chart, labs and discussed the procedure including the risks, benefits and alternatives for the proposed anesthesia with the patient or authorized representative who has indicated his/her understanding and acceptance.       Plan Discussed with: Anesthesiologist  Anesthesia Plan Comments:         Anesthesia Quick Evaluation

## 2019-05-12 NOTE — Anesthesia Procedure Notes (Signed)
Epidural Patient location during procedure: OB Start time: 05/12/2019 8:58 PM End time: 05/12/2019 9:03 PM  Staffing Anesthesiologist: Achille Rich, MD Performed: anesthesiologist   Preanesthetic Checklist Completed: patient identified, site marked, pre-op evaluation, timeout performed, IV checked, risks and benefits discussed and monitors and equipment checked  Epidural Patient position: sitting Prep: DuraPrep Patient monitoring: heart rate, cardiac monitor, continuous pulse ox and blood pressure Approach: midline Location: L2-L3 Injection technique: LOR saline  Needle:  Needle type: Tuohy  Needle gauge: 17 G Needle length: 9 cm Needle insertion depth: 5 cm Catheter type: closed end flexible Catheter size: 19 Gauge Catheter at skin depth: 12 cm Test dose: negative and Other  Assessment Events: blood not aspirated, injection not painful, no injection resistance and negative IV test  Additional Notes Informed consent obtained prior to proceeding including risk of failure, 1% risk of PDPH, risk of minor discomfort and bruising.  Discussed rare but serious complications including epidural abscess, permanent nerve injury, epidural hematoma.  Discussed alternatives to epidural analgesia and patient desires to proceed.  Timeout performed pre-procedure verifying patient name, procedure, and platelet count.  Patient tolerated procedure well. Reason for block:procedure for pain

## 2019-05-13 ENCOUNTER — Inpatient Hospital Stay (HOSPITAL_COMMUNITY): Payer: Medicaid Other | Admitting: Anesthesiology

## 2019-05-13 ENCOUNTER — Encounter (HOSPITAL_COMMUNITY)
Admission: AD | Disposition: A | Discharge status: Home / Self Care | Payer: Self-pay | Source: Home / Self Care | Attending: Family Medicine

## 2019-05-13 ENCOUNTER — Ambulatory Visit (HOSPITAL_COMMUNITY): Payer: Medicaid Other

## 2019-05-13 ENCOUNTER — Encounter (HOSPITAL_COMMUNITY): Payer: Self-pay | Admitting: Anesthesiology

## 2019-05-13 DIAGNOSIS — Z3A35 35 weeks gestation of pregnancy: Secondary | ICD-10-CM

## 2019-05-13 DIAGNOSIS — O4202 Full-term premature rupture of membranes, onset of labor within 24 hours of rupture: Secondary | ICD-10-CM

## 2019-05-13 DIAGNOSIS — Z302 Encounter for sterilization: Secondary | ICD-10-CM

## 2019-05-13 DIAGNOSIS — O365931 Maternal care for other known or suspected poor fetal growth, third trimester, fetus 1: Secondary | ICD-10-CM

## 2019-05-13 DIAGNOSIS — O30043 Twin pregnancy, dichorionic/diamniotic, third trimester: Secondary | ICD-10-CM

## 2019-05-13 HISTORY — PX: TUBAL LIGATION: SHX77

## 2019-05-13 LAB — MRSA PCR SCREENING: MRSA by PCR: NEGATIVE

## 2019-05-13 LAB — RPR: RPR Ser Ql: NONREACTIVE

## 2019-05-13 SURGERY — LIGATION, FALLOPIAN TUBE, POSTPARTUM
Anesthesia: Epidural | Laterality: Bilateral

## 2019-05-13 MED ORDER — LACTATED RINGERS IV SOLN
INTRAVENOUS | Status: DC
  Administered 2019-05-13: 08:00:00 via INTRAVENOUS

## 2019-05-13 MED ORDER — PRENATAL MULTIVITAMIN CH
1.0000 | ORAL_TABLET | Freq: Every day | ORAL | Status: DC
  Administered 2019-05-13 – 2019-05-14 (×2): 1 via ORAL
  Filled 2019-05-13 (×2): qty 1

## 2019-05-13 MED ORDER — PROPOFOL 10 MG/ML IV BOLUS
INTRAVENOUS | Status: AC
  Filled 2019-05-13: qty 20

## 2019-05-13 MED ORDER — DIPHENHYDRAMINE HCL 25 MG PO CAPS: 25.0000 mg | ORAL_CAPSULE | Freq: Four times a day (QID) | ORAL | Status: DC | PRN

## 2019-05-13 MED ORDER — DIBUCAINE (PERIANAL) 1 % EX OINT: 1.0000 "application " | TOPICAL_OINTMENT | CUTANEOUS | Status: DC | PRN

## 2019-05-13 MED ORDER — BUPIVACAINE HCL (PF) 0.25 % IJ SOLN
INTRAMUSCULAR | Status: AC
  Filled 2019-05-13: qty 20

## 2019-05-13 MED ORDER — HYDROMORPHONE HCL 1 MG/ML IJ SOLN: 0.2500 mg | INTRAMUSCULAR | Status: DC | PRN

## 2019-05-13 MED ORDER — ONDANSETRON HCL 4 MG/2ML IJ SOLN
INTRAMUSCULAR | Status: DC | PRN
  Administered 2019-05-13: 4 mg via INTRAVENOUS

## 2019-05-13 MED ORDER — MEPERIDINE HCL 25 MG/ML IJ SOLN: 6.2500 mg | INTRAMUSCULAR | Status: DC | PRN

## 2019-05-13 MED ORDER — KETOROLAC TROMETHAMINE 30 MG/ML IJ SOLN
30.0000 mg | Freq: Once | INTRAMUSCULAR | Status: AC | PRN
  Administered 2019-05-13: 30 mg via INTRAVENOUS

## 2019-05-13 MED ORDER — ONDANSETRON HCL 4 MG/2ML IJ SOLN
INTRAMUSCULAR | Status: AC
  Filled 2019-05-13: qty 2

## 2019-05-13 MED ORDER — ONDANSETRON HCL 4 MG/2ML IJ SOLN: 4.0000 mg | INTRAMUSCULAR | Status: DC | PRN

## 2019-05-13 MED ORDER — WITCH HAZEL-GLYCERIN EX PADS: 1.0000 "application " | MEDICATED_PAD | CUTANEOUS | Status: DC | PRN

## 2019-05-13 MED ORDER — KETOROLAC TROMETHAMINE 30 MG/ML IJ SOLN
INTRAMUSCULAR | Status: AC
  Filled 2019-05-13: qty 1

## 2019-05-13 MED ORDER — SODIUM BICARBONATE 8.4 % IV SOLN
INTRAVENOUS | Status: AC
  Filled 2019-05-13: qty 50

## 2019-05-13 MED ORDER — PROMETHAZINE HCL 25 MG/ML IJ SOLN: 6.2500 mg | INTRAMUSCULAR | Status: DC | PRN

## 2019-05-13 MED ORDER — FAMOTIDINE 20 MG PO TABS
40.0000 mg | ORAL_TABLET | Freq: Once | ORAL | Status: AC
  Administered 2019-05-13: 40 mg via ORAL
  Filled 2019-05-13: qty 2

## 2019-05-13 MED ORDER — COCONUT OIL OIL: 1.0000 "application " | TOPICAL_OIL | Status: DC | PRN

## 2019-05-13 MED ORDER — SENNOSIDES-DOCUSATE SODIUM 8.6-50 MG PO TABS
2.0000 | ORAL_TABLET | ORAL | Status: DC
  Administered 2019-05-13 (×2): 2 via ORAL
  Filled 2019-05-13 (×2): qty 2

## 2019-05-13 MED ORDER — OXYCODONE-ACETAMINOPHEN 5-325 MG PO TABS
1.0000 | ORAL_TABLET | ORAL | Status: DC | PRN
  Administered 2019-05-13 – 2019-05-14 (×3): 2 via ORAL
  Filled 2019-05-13 (×4): qty 2

## 2019-05-13 MED ORDER — ZOLPIDEM TARTRATE 5 MG PO TABS: 5.0000 mg | ORAL_TABLET | Freq: Every evening | ORAL | Status: DC | PRN

## 2019-05-13 MED ORDER — LIDOCAINE-EPINEPHRINE (PF) 2 %-1:200000 IJ SOLN
INTRAMUSCULAR | Status: AC
  Filled 2019-05-13: qty 10

## 2019-05-13 MED ORDER — SODIUM BICARBONATE 8.4 % IV SOLN
INTRAVENOUS | Status: DC | PRN
  Administered 2019-05-13: 3 mL via EPIDURAL
  Administered 2019-05-13: 7 mL via EPIDURAL
  Administered 2019-05-13: 8 mL via EPIDURAL

## 2019-05-13 MED ORDER — MIDAZOLAM HCL 2 MG/2ML IJ SOLN
INTRAMUSCULAR | Status: AC
  Filled 2019-05-13: qty 2

## 2019-05-13 MED ORDER — ONDANSETRON HCL 4 MG PO TABS: 4.0000 mg | ORAL_TABLET | ORAL | Status: DC | PRN

## 2019-05-13 MED ORDER — TETANUS-DIPHTH-ACELL PERTUSSIS 5-2.5-18.5 LF-MCG/0.5 IM SUSP: 0.5000 mL | Freq: Once | INTRAMUSCULAR | Status: DC

## 2019-05-13 MED ORDER — BENZOCAINE-MENTHOL 20-0.5 % EX AERO: 1.0000 "application " | INHALATION_SPRAY | CUTANEOUS | Status: DC | PRN

## 2019-05-13 MED ORDER — DEXAMETHASONE SODIUM PHOSPHATE 10 MG/ML IJ SOLN
INTRAMUSCULAR | Status: AC
  Filled 2019-05-13: qty 1

## 2019-05-13 MED ORDER — BUPIVACAINE HCL (PF) 0.25 % IJ SOLN
INTRAMUSCULAR | Status: AC
  Filled 2019-05-13: qty 10

## 2019-05-13 MED ORDER — FENTANYL CITRATE (PF) 100 MCG/2ML IJ SOLN
INTRAMUSCULAR | Status: DC | PRN
  Administered 2019-05-13: 50 ug via INTRAVENOUS
  Administered 2019-05-13: 100 ug via EPIDURAL
  Administered 2019-05-13: 50 ug via INTRAVENOUS

## 2019-05-13 MED ORDER — LACTATED RINGERS IV SOLN
INTRAVENOUS | Status: DC | PRN
  Administered 2019-05-13: 10:00:00 via INTRAVENOUS

## 2019-05-13 MED ORDER — METOCLOPRAMIDE HCL 10 MG PO TABS
10.0000 mg | ORAL_TABLET | Freq: Once | ORAL | Status: AC
  Administered 2019-05-13: 10 mg via ORAL
  Filled 2019-05-13: qty 1

## 2019-05-13 MED ORDER — PROPOFOL 10 MG/ML IV BOLUS
INTRAVENOUS | Status: DC | PRN
  Administered 2019-05-13: 10 mg via INTRAVENOUS

## 2019-05-13 MED ORDER — BUPIVACAINE HCL (PF) 0.25 % IJ SOLN
INTRAMUSCULAR | Status: DC | PRN
  Administered 2019-05-13: 7 mL

## 2019-05-13 MED ORDER — IBUPROFEN 600 MG PO TABS
600.0000 mg | ORAL_TABLET | Freq: Four times a day (QID) | ORAL | Status: DC
  Administered 2019-05-13 – 2019-05-14 (×6): 600 mg via ORAL
  Filled 2019-05-13 (×6): qty 1

## 2019-05-13 MED ORDER — FENTANYL CITRATE (PF) 100 MCG/2ML IJ SOLN
INTRAMUSCULAR | Status: AC
  Filled 2019-05-13: qty 4

## 2019-05-13 MED ORDER — MIDAZOLAM HCL 5 MG/5ML IJ SOLN
INTRAMUSCULAR | Status: DC | PRN
  Administered 2019-05-13 (×2): 1 mg via INTRAVENOUS

## 2019-05-13 MED ORDER — SIMETHICONE 80 MG PO CHEW: 80.0000 mg | CHEWABLE_TABLET | ORAL | Status: DC | PRN

## 2019-05-13 MED ORDER — ACETAMINOPHEN 325 MG PO TABS
650.0000 mg | ORAL_TABLET | ORAL | Status: DC | PRN
  Administered 2019-05-13: 650 mg via ORAL
  Filled 2019-05-13: qty 2

## 2019-05-13 MED ORDER — DEXAMETHASONE SODIUM PHOSPHATE 4 MG/ML IJ SOLN
INTRAMUSCULAR | Status: DC | PRN
  Administered 2019-05-13: 8 mg via INTRAVENOUS

## 2019-05-13 SURGICAL SUPPLY — 22 items
BLADE SURG 11 STRL SS (BLADE) ×3 IMPLANT
CLOTH BEACON ORANGE TIMEOUT ST (SAFETY) ×3 IMPLANT
DRESSING OPSITE X SMALL 2X3 (GAUZE/BANDAGES/DRESSINGS) ×2 IMPLANT
DRSG OPSITE POSTOP 3X4 (GAUZE/BANDAGES/DRESSINGS) ×3 IMPLANT
DURAPREP 26ML APPLICATOR (WOUND CARE) ×3 IMPLANT
GLOVE BIO SURGEON STRL SZ 6.5 (GLOVE) ×2 IMPLANT
GLOVE BIO SURGEONS STRL SZ 6.5 (GLOVE) ×1
GLOVE BIOGEL PI IND STRL 7.0 (GLOVE) ×2 IMPLANT
GLOVE BIOGEL PI INDICATOR 7.0 (GLOVE) ×4
GOWN STRL REUS W/TWL LRG LVL3 (GOWN DISPOSABLE) ×6 IMPLANT
NEEDLE HYPO 22GX1.5 SAFETY (NEEDLE) ×3 IMPLANT
NS IRRIG 1000ML POUR BTL (IV SOLUTION) ×3 IMPLANT
PACK ABDOMINAL MINOR (CUSTOM PROCEDURE TRAY) ×3 IMPLANT
PROTECTOR NERVE ULNAR (MISCELLANEOUS) ×3 IMPLANT
SPONGE LAP 4X18 RFD (DISPOSABLE) IMPLANT
SUT VIC AB 0 CT1 27 (SUTURE) ×3
SUT VIC AB 0 CT1 27XBRD ANBCTR (SUTURE) ×1 IMPLANT
SUT VICRYL 4-0 PS2 18IN ABS (SUTURE) ×3 IMPLANT
SYR CONTROL 10ML LL (SYRINGE) ×3 IMPLANT
TOWEL OR 17X24 6PK STRL BLUE (TOWEL DISPOSABLE) ×6 IMPLANT
TRAY FOLEY CATH SILVER 16FR (SET/KITS/TRAYS/PACK) ×3 IMPLANT
WATER STERILE IRR 1000ML POUR (IV SOLUTION) ×3 IMPLANT

## 2019-05-13 NOTE — Transfer of Care (Signed)
Immediate Anesthesia Transfer of Care Note  Patient: Christy West [facility]  Procedure(s) Performed: POST PARTUM TUBAL LIGATION (Bilateral )  Patient Location: PACU  Anesthesia Type:Epidural  Level of Consciousness: awake, alert  and oriented  Airway & Oxygen Therapy: Patient Spontanous Breathing  Post-op Assessment: Report given to RN and Post -op Vital signs reviewed and stable  Post vital signs: Reviewed and stable  Last Vitals:  Vitals Value Taken Time  BP 111/76 05/13/2019 10:32 AM  Temp    Pulse 75 05/13/2019 10:35 AM  Resp 14 05/13/2019 10:35 AM  SpO2 96 % 05/13/2019 10:35 AM  Vitals shown include unvalidated device data.  Last Pain:  Vitals:   05/13/19 0800  TempSrc:   PainSc: 0-No pain      Patients Stated Pain Goal: 2 (05/13/19 0800)  Complications: No apparent anesthesia complications

## 2019-05-13 NOTE — Anesthesia Preprocedure Evaluation (Signed)
Anesthesia Evaluation  Patient identified by MRN, date of birth, ID band Patient awake    Reviewed: Allergy & Precautions, H&P , NPO status , Patient's Chart, lab work & pertinent test results  Airway Mallampati: I   Neck ROM: full    Dental no notable dental hx. (+) Teeth Intact   Pulmonary Current Smoker,    breath sounds clear to auscultation       Cardiovascular negative cardio ROS Normal cardiovascular exam Rhythm:regular Rate:Normal     Neuro/Psych negative neurological ROS  negative psych ROS   GI/Hepatic negative GI ROS, Neg liver ROS,   Endo/Other  negative endocrine ROS  Renal/GU negative Renal ROS  negative genitourinary   Musculoskeletal   Abdominal Normal abdominal exam  (+)   Peds  Hematology  (+) Blood dyscrasia, anemia ,   Anesthesia Other Findings   Reproductive/Obstetrics (+) Pregnancy                             Anesthesia Physical  Anesthesia Plan  ASA: II  Anesthesia Plan: Epidural   Post-op Pain Management:    Induction:   PONV Risk Score and Plan: 2 and Treatment may vary due to age or medical condition, Ondansetron and Dexamethasone  Airway Management Planned: Natural Airway and Nasal Cannula  Additional Equipment:   Intra-op Plan:   Post-operative Plan:   Informed Consent: I have reviewed the patients History and Physical, chart, labs and discussed the procedure including the risks, benefits and alternatives for the proposed anesthesia with the patient or authorized representative who has indicated his/her understanding and acceptance.       Plan Discussed with: CRNA  Anesthesia Plan Comments:         Anesthesia Quick Evaluation

## 2019-05-13 NOTE — Op Note (Signed)
Anagrace Brownlie [facility] 05/13/2019  PREOPERATIVE DIAGNOSIS:  Multiparity, undesired fertility  POSTOPERATIVE DIAGNOSIS:  Multiparity, undesired fertility  PROCEDURE:  Postpartum Bilateral Tubal Sterilization using Filshie Clips   ANESTHESIA:  Epidural  COMPLICATIONS:  None immediate.  ESTIMATED BLOOD LOSS:  Less than 20 ml.  FLUIDS: 1000 ml LR.  URINE OUTPUT:  400 ml of clear urine.  INDICATIONS: 33 y.o. [id number]  with undesired fertility,status post vaginal delivery, desires permanent sterilization. Risks and benefits of procedure discussed with patient including permanence of method, bleeding, infection, injury to surrounding organs and need for additional procedures. Risk failure of 0.5-1% with increased risk of ectopic gestation if pregnancy occurs was also discussed with patient.   FINDINGS:  Normal uterus, tubes, and ovaries.  TECHNIQUE:  The patient was taken to the operating room where her epidural anesthesia was dosed up to surgical level and found to be adequate.  She was then placed in the dorsal supine position and prepped and draped in sterile fashion.  After an adequate timeout was performed, attention was turned to the patient's abdomen where a small transverse skin incision was made under the umbilical fold. The incision was taken down to the layer of fascia using the scalpel, and fascia was incised, and extended bilaterally using Mayo scissors. The peritoneum was entered in a sharp fashion. Attention was then turned to the patient's uterus, and left fallopian tube was identified and followed out to the fimbriated end.  A Filshie clip was placed on the left fallopian tube about 2 cm from the cornual attachment, with care given to incorporate the underlying mesosalpinx.  A similar process was carried out on the right side allowing for bilateral tubal sterilization.  Good hemostasis was noted overall.  Local analgesia was drizzled on both operative sites.The instruments were then removed  from the patient's abdomen and the fascial incision was repaired with 0 Vicryl, and the skin was closed with a 4-0 Vicryl subcuticular stitch. The patient tolerated the procedure well.  Sponge, lap, and needle counts were correct times two.  The patient was then taken to the recovery room awake, extubated and in stable condition.  Scheryl Darter MD 05/13/2019 10:45 AM

## 2019-05-13 NOTE — Progress Notes (Signed)
Patient ID: Christy West, female   DOB: 02/05/1986, 33 y.o.   MRN: [id number] 33 y.o. [id number] with undesired fertility,status post vaginal delivery, desires permanent sterilization. Risks and benefits of procedure discussed with patient including permanence of method, bleeding, use of Filshie clips, infection, injury to surrounding organs and need for additional procedures. Risk failure of 0.5-1% with increased risk of ectopic gestation if pregnancy occurs was also discussed with patient. Patient verbalized understanding and all questions were answered.  Currie Paris Debroah Loop MD 05/13/2019 9:05 AM

## 2019-05-13 NOTE — Anesthesia Postprocedure Evaluation (Signed)
Anesthesia Post Note  Patient: Christy West [facility]  Procedure(s) Performed: AN AD HOC LABOR EPIDURAL     Patient location during evaluation: Mother Baby Anesthesia Type: Epidural Level of consciousness: awake and alert and oriented Pain management: satisfactory to patient Vital Signs Assessment: post-procedure vital signs reviewed and stable Respiratory status: respiratory function stable Cardiovascular status: stable Postop Assessment: no headache, no backache, epidural receding, patient able to bend at knees, no signs of nausea or vomiting and adequate PO intake Anesthetic complications: no    Last Vitals:  Vitals:   05/13/19 0435 05/13/19 0723  BP: 111/60 122/82  Pulse: 75 64  Resp: 16 18  Temp: 36.6 C 36.6 C  SpO2: 100% 100%    Last Pain:  Vitals:   05/13/19 0723  TempSrc: Oral  PainSc:    Pain Goal: Patients Stated Pain Goal: 2 (05/13/19 0145)                 Karleen Dolphin

## 2019-05-14 ENCOUNTER — Encounter (HOSPITAL_COMMUNITY): Payer: Self-pay | Admitting: *Deleted

## 2019-05-14 MED ORDER — OXYCODONE-ACETAMINOPHEN 5-325 MG PO TABS: 1.0000 | ORAL_TABLET | ORAL | 0 refills | Status: DC | PRN

## 2019-05-14 MED ORDER — IBUPROFEN 600 MG PO TABS: 600.0000 mg | ORAL_TABLET | Freq: Four times a day (QID) | ORAL | 0 refills | Status: DC

## 2019-05-14 NOTE — Clinical Social Work Maternal (Signed)
CLINICAL SOCIAL WORK MATERNAL/CHILD NOTE  Patient Details  Name: Christy West MRN: [id number] Date of Birth: 08/05/1986  Date:  05/14/2019  Clinical Social Worker Initiating Note:  Dalton Mille Boyd-Gilyard Date/Time: Initiated:  05/13/19/1427     Child's Name:  Christy and Christy West   Biological Parents:  Mother, Father(FOB is Damien West 12/30/1990)   Need for Interpreter:  None   Reason for Referral:  Parental Support of Premature Babies < 32 weeks/or Critically Ill babies, Current Substance Use/Substance Use During Pregnancy    Address:  [address] [place] [address]    Phone number:  [phone] (home)     Additional phone number:   Household Members/Support Persons (HM/SP):   Household Member/Support Person 1, Household Member/Support Person 2(MOB reported that MOB and her children reside with MOB's grandmother and MOB's uncle. )   HM/SP Name Relationship DOB or Age  HM/SP -1 Christy West  son 07/12/2014  HM/SP -2 JaeOnah West son 04/24/2015  HM/SP -3        HM/SP -4        HM/SP -5        HM/SP -6        HM/SP -7        HM/SP -8          Natural Supports (not living in the home):  Extended Family, Parent, Immediate Family, Spouse/significant other(MOB also reported that FOB's family will also provide supports if needed. )   Professional Supports: None   Employment: Unemployed   Type of Work:     Education:  Some College   Homebound arranged:    Financial Resources:  Medicaid   Other Resources:  WIC, Food Stamps    Cultural/Religious Considerations Which May Impact Care:  Per MOB's Face Sheet, MOB is Christian.  Strengths:  Ability to meet basic needs , Pediatrician chosen   Psychotropic Medications:         Pediatrician:    [place] area  Pediatrician List:   [facility]  [place]    [place]    [place]    [place]    [place]      Pediatrician Fax Number:     Risk Factors/Current Problems:  Substance Use    Cognitive State:  Alert , Able to Concentrate , Linear Thinking , Insightful    Mood/Affect:  Happy , Interested , Comfortable    CSW Assessment: CSW met with MOB in room 116 to complete an assessment for NICU admission and SA hx.  When CSW arrived, MOB was on FaceTime with someone but ended the conversation after CSW introduced herself.  MOB was polite, easy to engage, and was receptive to meeting with CSW.   CSW asked about MOB's thoughts and feelings about twins NICU admission. MOB reported feeling good and reported having a good understanding of twins medical health. MOB also denied having any MH hx and denied PPD with older children.  MOB made CSW aware that MOB had another child in the NICU (Christy) and is familiar with goals that twins will need to meet in order to be discharged.  CSW provided education regarding the baby blues period vs. perinatal mood disorders, discussed treatment and gave resources for mental health follow up if concerns arise.  CSW recommends self-evaluation during the postpartum time period and contacted a medical professional if symptoms are noted at any time; MOB agreed. CSW assessed for safety and MOB denied SI, HI, and DV.     CSW shared with MOB that Cherlyn Cushing is eligible for SSI benefits and explained the process to complete an application.  MOB plans to follow-up with CSW prior to the end of June to apply for benefits.   CSW asked about MOB's SA hx and MOB denied a hx.  CSW made MOB aware that MOB's UDS was positive for Cocaine and THC on admission.  MOB initially denied the use of both substances and was adamant.  As CSW explained hospital's infant's substance exposure policy MOB admitted to the use of THC but continued to deny the use of cocaine. Per MOB, her last use of THC was "About 1 week ago."  CSW communicated to MOB that twins UDS screens were negative and CSW will continue to monitor twins CDS and will  make a report to [place] if warranted. MOB was understanding and acknowledged CPS hx.  MOB reported that MOB's last CPS case was about 1 year ago and closed after "about 60 days." MOB stated, "I went to [place] because I was fleeing my criminal charges and someone call CPS and said my kids were at risk."  MOB shared that CPS arranged for a kinship placement with [facility] and they were placed back with MOB after she resolved her criminal case ( CSW was unable to reverify information with [place]).  CSW offered MOB resources for outpatient SA counseling and MOB declined.   MOB reported not having car seats and safe sleeping areas for twins however, reported being able to obtain items prior to twins discharge.  CSW made MOB aware that if MOB is unable to obtain items that CSW may have some resources to assist.   CSW will continue to provide resources an supports to the family while twins remain in the NICU.  CSW Plan/Description:  Psychosocial Support and Ongoing Assessment of Needs, Perinatal Mood and Anxiety Disorder (PMADs) Education, Other Patient/Family Education, Theatre stage manager Income (SSI) Information, [place], CSW Will Continue to Monitor Umbilical Cord Tissue Drug Screen Results and Make Report if Warranted   Laurey Arrow, MSW, LCSW Clinical Social Work [phone]   Dimple Nanas, LCSW 05/14/2019, 11:35 AM

## 2019-05-14 NOTE — Progress Notes (Signed)
Pt discharged to home with significant other.  Condition stable.  Pt and significant other ambulated off floor without notifying staff.  No equipment for home ordered at discharge.

## 2019-05-14 NOTE — Anesthesia Postprocedure Evaluation (Signed)
Anesthesia Post Note  Patient: Ezell Ayer [facility]  Procedure(s) Performed: POST PARTUM TUBAL LIGATION (Bilateral )     Patient location during evaluation: PACU Anesthesia Type: Epidural Level of consciousness: awake Pain management: pain level controlled Vital Signs Assessment: post-procedure vital signs reviewed and stable Respiratory status: spontaneous breathing Cardiovascular status: stable Postop Assessment: no headache, no backache, epidural receding, patient able to bend at knees and no apparent nausea or vomiting Anesthetic complications: no    Last Vitals:  Vitals:   05/14/19 0530 05/14/19 0809  BP: 131/78 98/62  Pulse: 70 (!) 59  Resp: 18 16  Temp: 36.8 C 36.8 C  SpO2: 100% 98%    Last Pain:  Vitals:   05/14/19 0809  TempSrc: Oral  PainSc:    Pain Goal: Patients Stated Pain Goal: 2 (05/13/19 1834)                 Caren Macadam

## 2019-05-14 NOTE — Discharge Instructions (Signed)

## 2019-05-14 NOTE — Lactation Note (Signed)
This note was copied from a baby's chart. Lactation Consultation Note  Patient Name: Astrid Michaels [id number] Date: 05/14/2019   [facility] Note:  In reviewing mother's chart I noted that her UDS was positive for THC and cocaine.  Consulted with RN regarding mother's feeding plan.  At this time mother is using donor breast milk for her NICU baby and will then use formula after hospital discharge.  RN will verify with mother this morning.                      Berley Gambrell R Dianah Pruett 05/14/2019, 8:44 AM

## 2019-05-16 ENCOUNTER — Other Ambulatory Visit (HOSPITAL_COMMUNITY): Payer: Medicaid Other

## 2019-05-23 ENCOUNTER — Telehealth: Payer: Self-pay | Admitting: Obstetrics and Gynecology

## 2019-05-23 NOTE — Telephone Encounter (Signed)
Called patient to get her signed up for MyChart. She stated she couldn't download while on the phone. I informed her I would send her appointment information in the mail.

## 2019-06-10 ENCOUNTER — Telehealth: Payer: Medicaid Other | Admitting: Obstetrics and Gynecology

## 2019-06-10 ENCOUNTER — Other Ambulatory Visit: Payer: Self-pay

## 2019-06-10 NOTE — Progress Notes (Unsigned)
2:18p- Called pt for My Chart visit, no answer, left VM, that I will call back within 10 to 15 minutes.

## 2019-06-11 ENCOUNTER — Other Ambulatory Visit: Payer: Self-pay

## 2019-06-11 ENCOUNTER — Telehealth (INDEPENDENT_AMBULATORY_CARE_PROVIDER_SITE_OTHER): Payer: Medicaid Other | Admitting: Advanced Practice Midwife

## 2019-06-11 ENCOUNTER — Telehealth: Payer: Medicaid Other | Admitting: Advanced Practice Midwife

## 2019-06-11 NOTE — Progress Notes (Signed)
I connected with  Christy West on 06/11/19 at 10:35 AM EDT by mychart video and verified that I am speaking with the correct person using two identifiers.   I discussed the limitations, risks, security and privacy concerns of performing an evaluation and management service by telephone and the availability of in person appointments. I also discussed with the patient that there may be a patient responsible charge related to this service. The patient expressed understanding and agreed to proceed.  [place], CMA 06/11/2019  10:46 AM

## 2019-06-11 NOTE — Progress Notes (Signed)
TELEHEALTH VIRTUAL POSTPARTUM VISIT ENCOUNTER NOTE  I connected with@ on 06/11/19 at 10:35 AM EDT by telephone at home and verified that I am speaking with the correct person using two identifiers.   I discussed the limitations, risks, security and privacy concerns of performing an evaluation and management service by telephone and the availability of in person appointments. I also discussed with the patient that there may be a patient responsible charge related to this service. The patient expressed understanding and agreed to proceed.  Appointment Date: 06/11/2019  [facility]N [facility]: [facility]  Chief Complaint:  Chief Complaint  Patient presents with  . Postpartum Care    History of Present Illness: Christy West is a 33 y.o. African-American [id number] (No LMP recorded.), seen for the above chief complaint. Her past medical history is significant for anemia, HSV   She is s/p NSVD and Breech Extraction on 05/12/2019 at 35 weeks; she was discharged to home on 2PPD#2. Pregnancy complicated by Preterm labor and delivery . One baby is at home and one baby is still in Gantt of has a hard spot in her belly button  Vaginal bleeding or discharge: Yes  Mode of feeding infant: Bottle, Breast and NICU baby is getting donor milk Intercourse: Yes  Contraception: bilateral tubal ligation PP depression s/s: No .  Any bowel or bladder issues: No  Pap smear: no abnormalities (date: 01/2019)  Review of Systems: Her 12 point review of systems is negative or as noted in the History of Present Illness.  Patient Active Problem List   Diagnosis Date Noted  . PROM (premature rupture of membranes) 05/12/2019  . SVD (spontaneous vaginal delivery) 05/12/2019  . Unwanted fertility 04/12/2019  . History of ELISA positive for HSV 04/12/2019  . IUGR (intrauterine growth restriction) affecting care of mother 04/05/2019  . Fetus A ?ASD 02/28/2019  . Supervision of high risk pregnancy, antepartum  01/30/2019  . Dichorionic diamniotic twin pregnancy, antepartum 01/30/2019  . Anemia affecting pregnancy in third trimester 03/10/2015  . History of preterm delivery, currently pregnant 01/06/2015    Medications Rosanna Randy had no medications administered during this visit. Current Outpatient Medications  Medication Sig Dispense Refill  . ibuprofen (ADVIL) 600 MG tablet Take 1 tablet (600 mg total) by mouth every 6 (six) hours. (Patient not taking: Reported on 06/11/2019) 30 tablet 0  . oxyCODONE-acetaminophen (PERCOCET/ROXICET) 5-325 MG tablet Take 1 tablet by mouth every 4 (four) hours as needed for moderate pain or severe pain. (Patient not taking: Reported on 06/11/2019) 10 tablet 0  . Prenatal Vit-Fe Fumarate-FA (PREPLUS) 27-1 MG TABS Take 1 tablet by mouth daily. (Patient not taking: Reported on 06/11/2019) 30 tablet 3  . valACYclovir (VALTREX) 500 MG tablet Take 1 tablet (500 mg total) by mouth 2 (two) times daily. (Patient not taking: Reported on 06/11/2019) 60 tablet 6   No current facility-administered medications for this visit.     Allergies Patient has no known allergies.  Physical Exam:  General:  Alert, oriented and cooperative.   Mental Status: Normal mood and affect perceived. Normal judgment and thought content.  Rest of physical exam deferred due to type of encounter  PP Depression Screening:    Assessment:Patient is a 34 y.o. [id number] who is 4 weeks postpartum from a normal spontaneous vaginal delivery.  She is doing well.   Plan:  1. Postpartum care and examination - doing well - had tubal ligation - Pap was done 01/2019   RTC 1 year or  PRN   I discussed the assessment and treatment plan with the patient. The patient was provided an opportunity to ask questions and all were answered. The patient agreed with the plan and demonstrated an understanding of the instructions.   The patient was advised to call back or seek an in-person evaluation/go to the ED  for any concerning postpartum symptoms.  I provided 15 minutes of non-face-to-face time during this encounter.   Thressa ShellerHeather Hogan DNP, CNM  06/11/19  10:48 AM  Center for [facility], [facility]

## 2019-08-10 IMAGING — US US MFM FETAL BPP WO NST ADDL GESTATION
1 series · 14 of 28 positions shown · non-contrast
Comparison: none

[Series 1: us mfm fetal bpp wo nst addl gestation · 82 acquisitions, 14 frames shown]
[im 4/82]
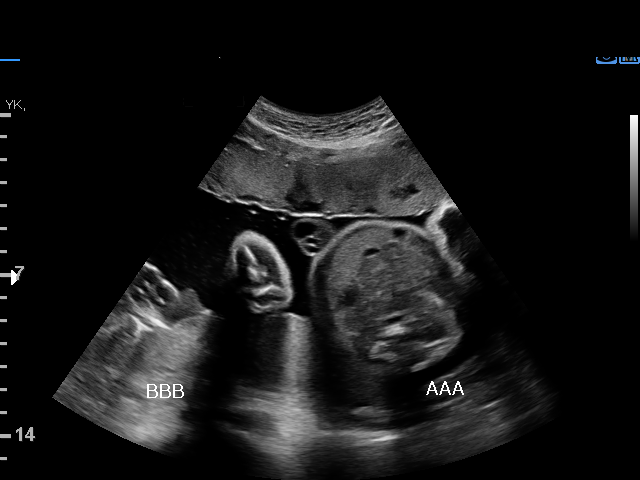
[im 10/82]
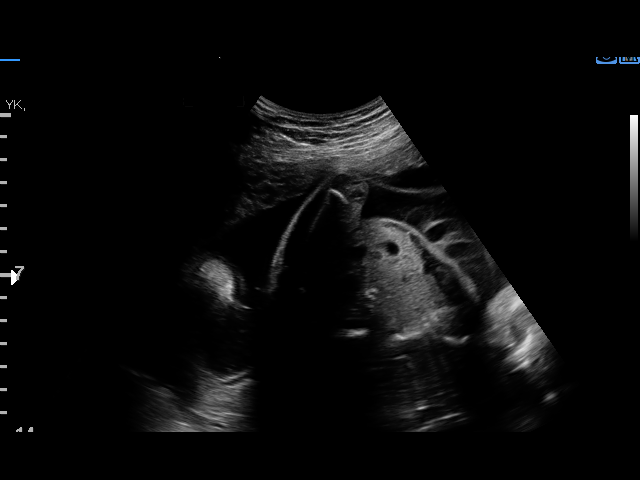
[im 16/82]
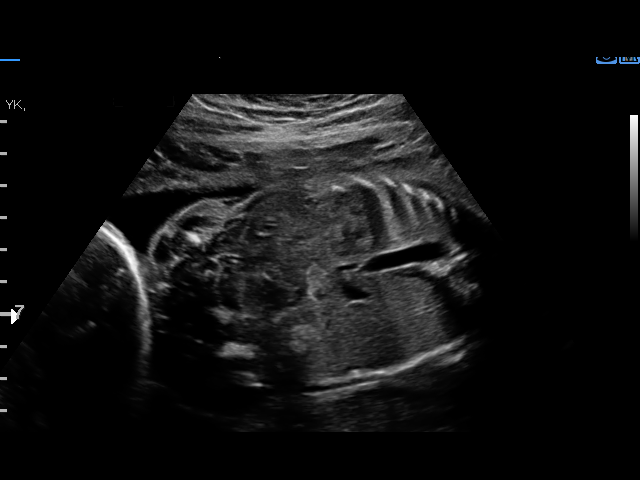
[im 22/82]
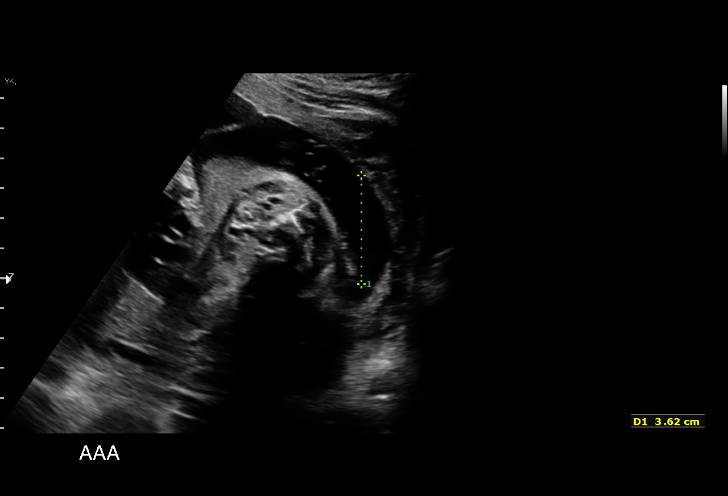
[im 28/82]
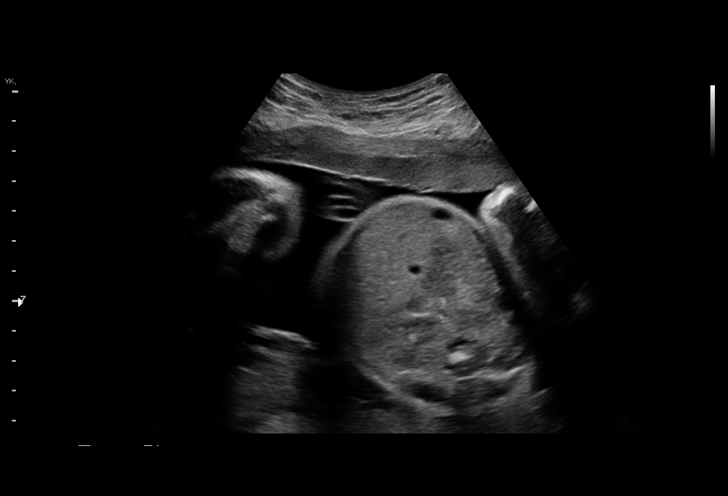
[im 34/82]
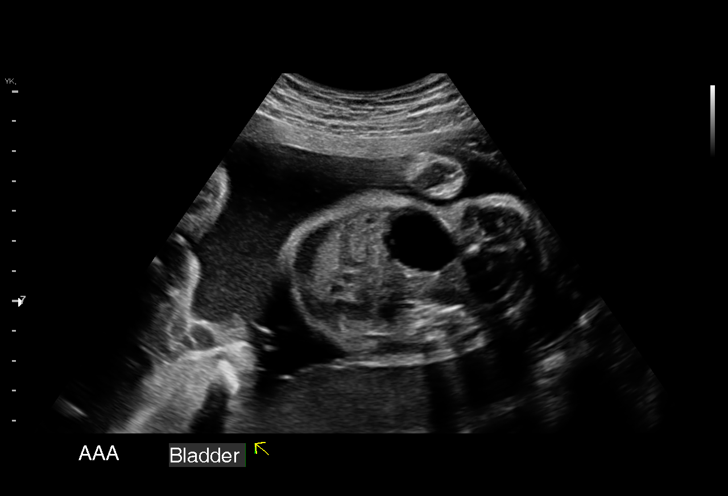
[im 40/82]
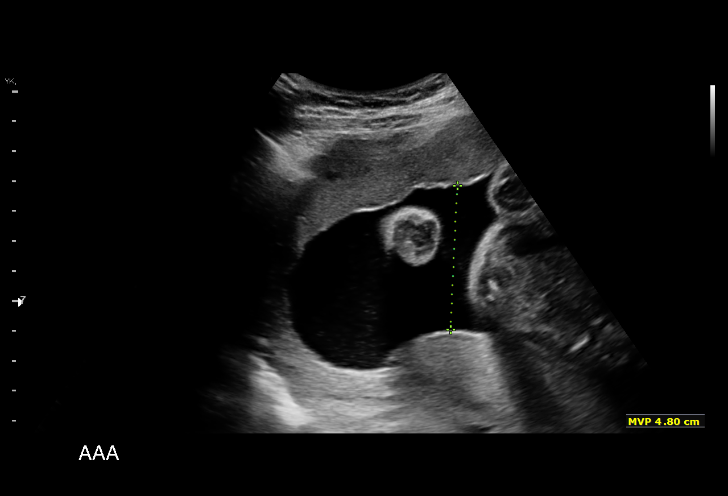
[im 46/82]
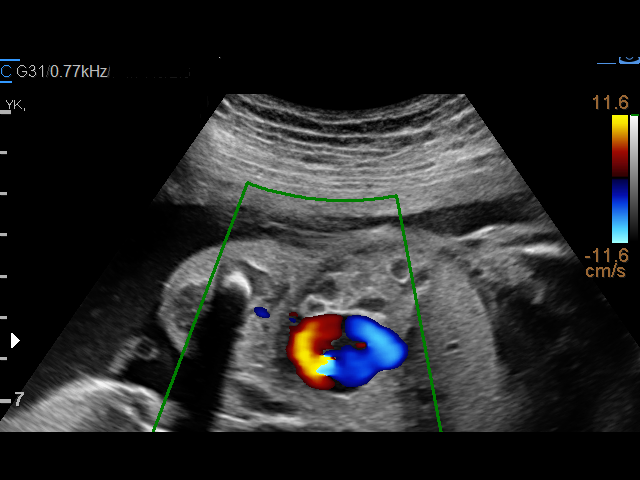
[im 52/82]
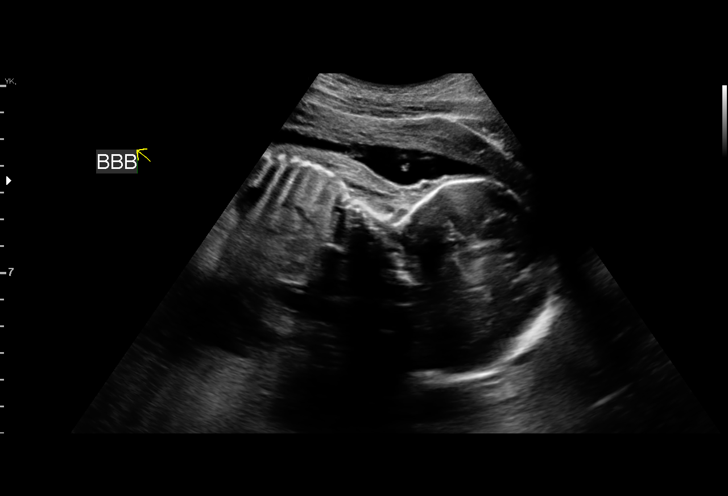
[im 58/82]
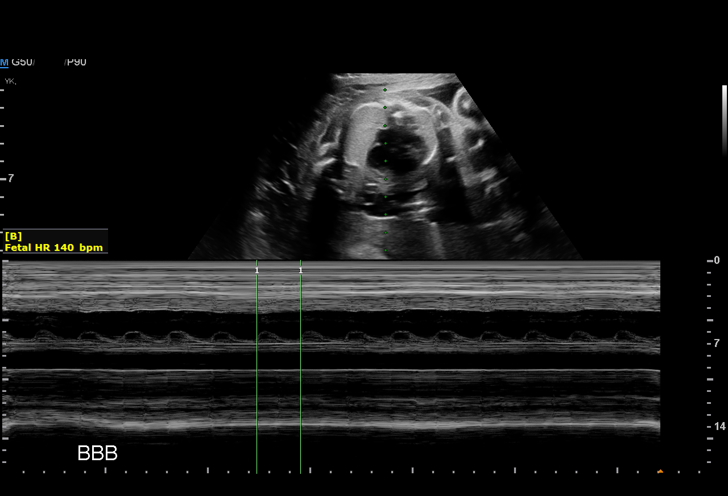
[im 64/82]
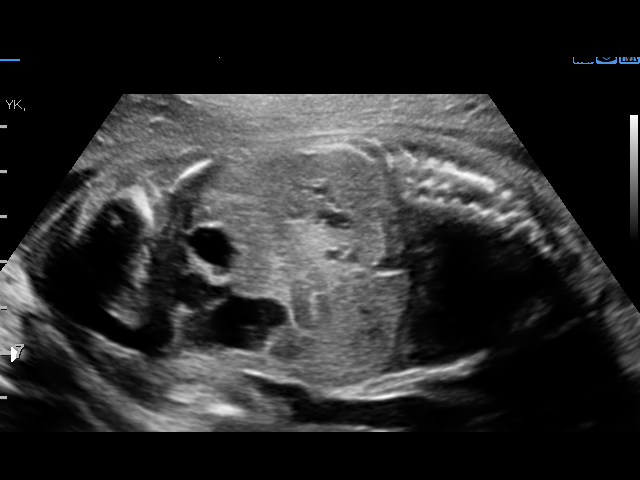
[im 70/82]
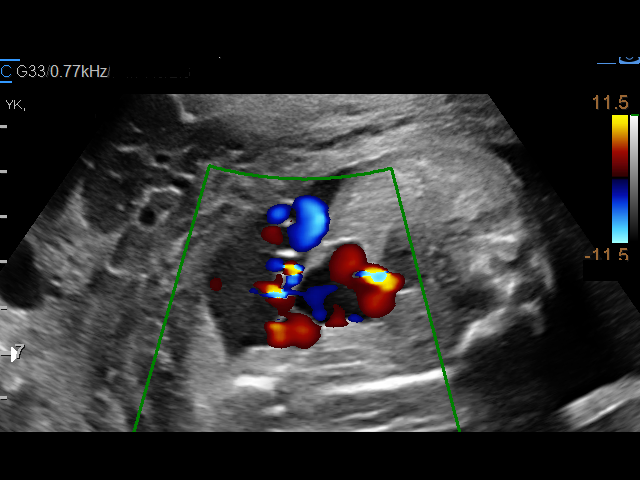
[im 76/82]
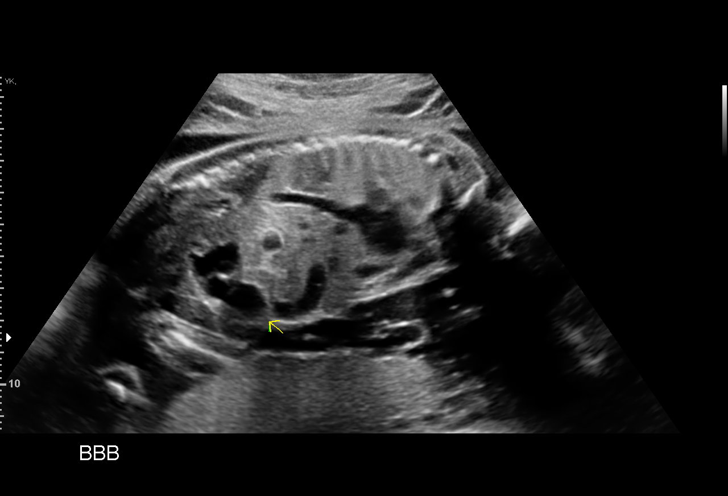
[im 82/82]
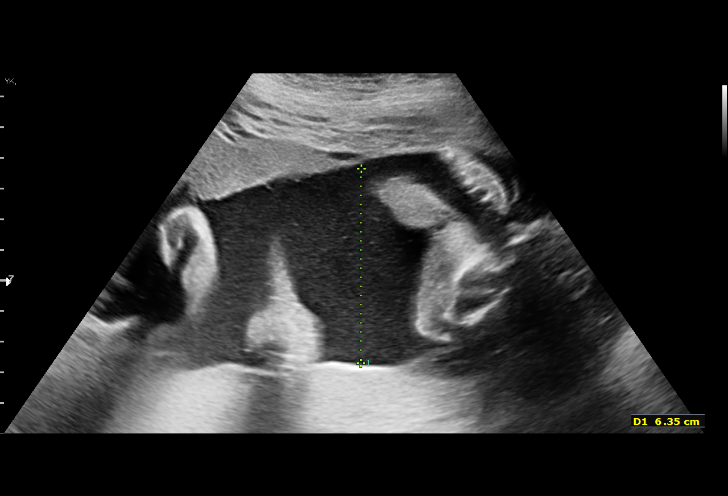

[14 of 28 positions shown; findings below may reference images not displayed]

ADDL GESTATION
 ----------------------------------------------------------------------

 ----------------------------------------------------------------------
Indications

  Drug use complicating pregnancy, third
  trimester (cocaine and THC)
  Poor obstetric history: Previous preterm
  delivery, antepartum (x2 94w6d/39wks)
  Twin pregnancy, di/di, third trimester
  Maternal care for known or suspected poor
  fetal growth, third trimester, fetus 1
  30 weeks gestation of pregnancy
 ----------------------------------------------------------------------
Vital Signs

                                                Height:        5'6"
Fetal Evaluation (Fetus A)

 Num Of Fetuses:         2
 Fetal Heart Rate(bpm):  140
 Cardiac Activity:       Observed
 Fetal Lie:              Lower Fetus
 Presentation:           Cephalic
 Placenta:               Posterior
 P. Cord Insertion:      Previously Visualized
 Membrane Desc:      Dividing Membrane seen

 Amniotic Fluid
 AFI FV:      Within normal limits

                             Largest Pocket(cm)

Biophysical Evaluation (Fetus A)

 Amniotic F.V:   Within normal limits       F. Tone:        Observed
 F. Movement:    Observed                   Score:          [DATE]
 F. Breathing:   Observed
Gestational Age (Fetus A)

 LMP:           33w 6d        Date:  08/12/18                 EDD:   05/19/19
 Best:          30w 2d     Det. By:  Previous Ultrasound      EDD:   06/13/19
                                     (12/23/18)
Anatomy (Fetus A)

 Ventricles:            Appears normal         Kidneys:                Appear normal
 Heart:                 Appears normal         Bladder:                Appears normal
                        (4CH, axis, and
                        situs)
 Stomach:               Appears normal, left
                        sided

Fetal Evaluation (Fetus B)

 Num Of Fetuses:         2
 Fetal Heart Rate(bpm):  132
 Cardiac Activity:       Observed
 Fetal Lie:              Upper Fetus
 Presentation:           Variable
 Placenta:               Anterior
 P. Cord Insertion:      Visualized, central

 Amniotic Fluid
 AFI FV:      Within normal limits

                             Largest Pocket(cm)

Biophysical Evaluation (Fetus B)

 Amniotic F.V:   Within normal limits       F. Tone:        Observed
 F. Movement:    Observed                   Score:          [DATE]
 F. Breathing:   Observed
Gestational Age (Fetus B)

 LMP:           33w 6d        Date:  08/12/18                 EDD:   05/19/19
 Best:          30w 2d     Det. By:  Previous Ultrasound      EDD:   06/13/19
                                     (12/23/18)
Anatomy (Fetus B)

 Ventricles:            Appears normal         Kidneys:                Appear normal
 Heart:                 Appears normal         Bladder:                Appears normal
                        (4CH, axis, and
                        situs)
 Stomach:               Appears normal, left
                        sided
Comments

 Umbilcial vein varix noted on Twin A

Impression

 Biophysical profile [DATE]
Recommendations

 Follow up per inpatient team
 Given reassuring testing consider outpatient management
 and testing in 1 week.

## 2019-08-20 IMAGING — US US MFM UA DOPPLER ADDL GEST RE EVAL
1 series · 12 of 20 positions shown · non-contrast
Comparison: none

[Series 1: us mfm ua doppler addl gest re eval · 20 acquisitions, 12 frames shown]
[im 1/20]
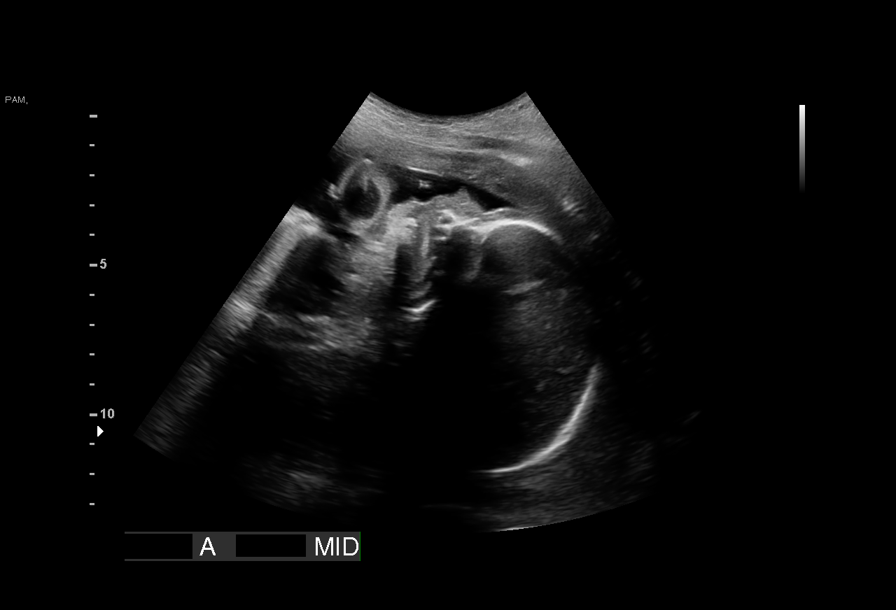
[im 3/20]
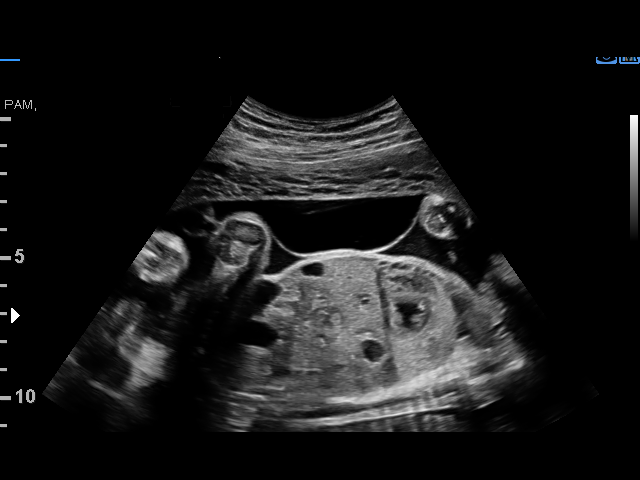
[im 5/20]
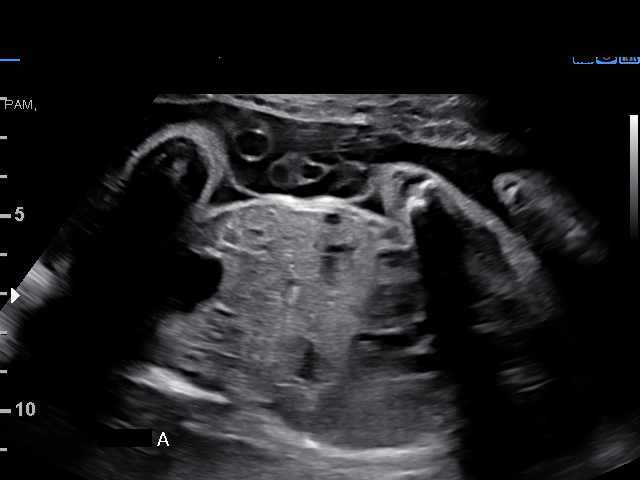
[im 6/20]
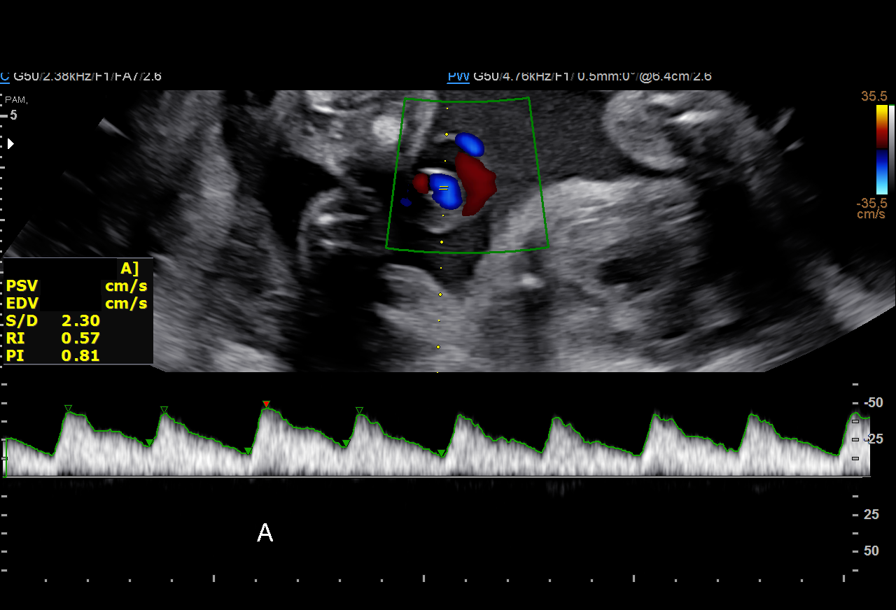
[im 8/20]
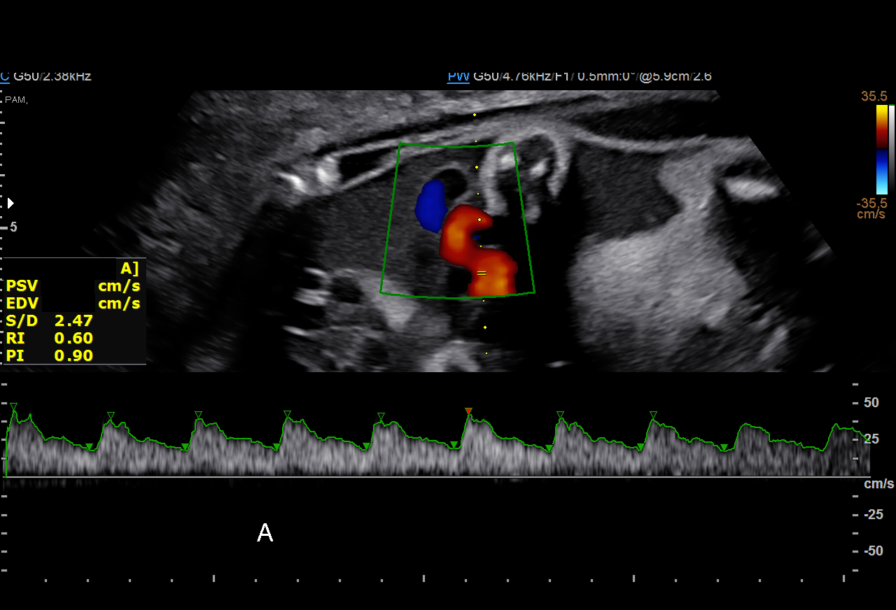
[im 10/20]
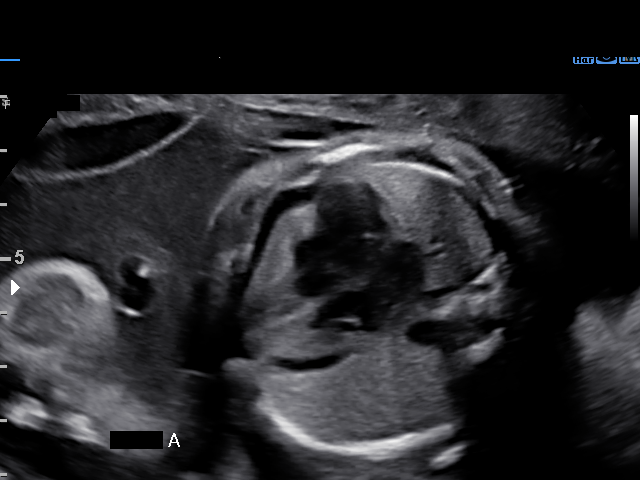
[im 11/20]
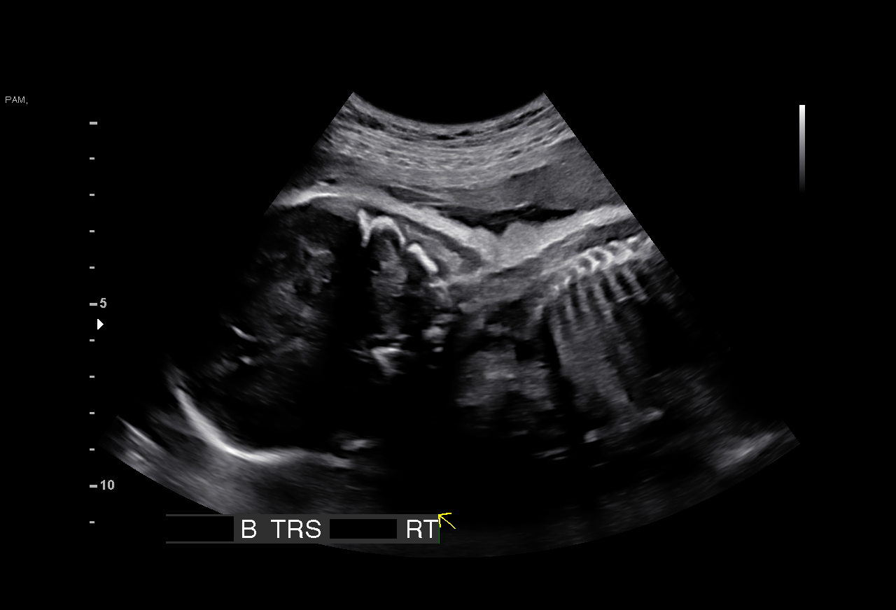
[im 13/20]
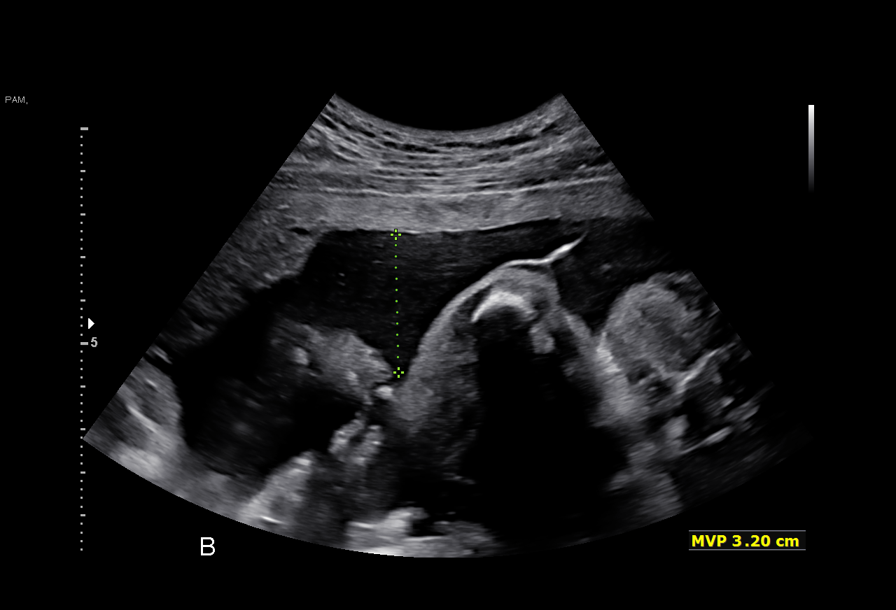
[im 15/20]
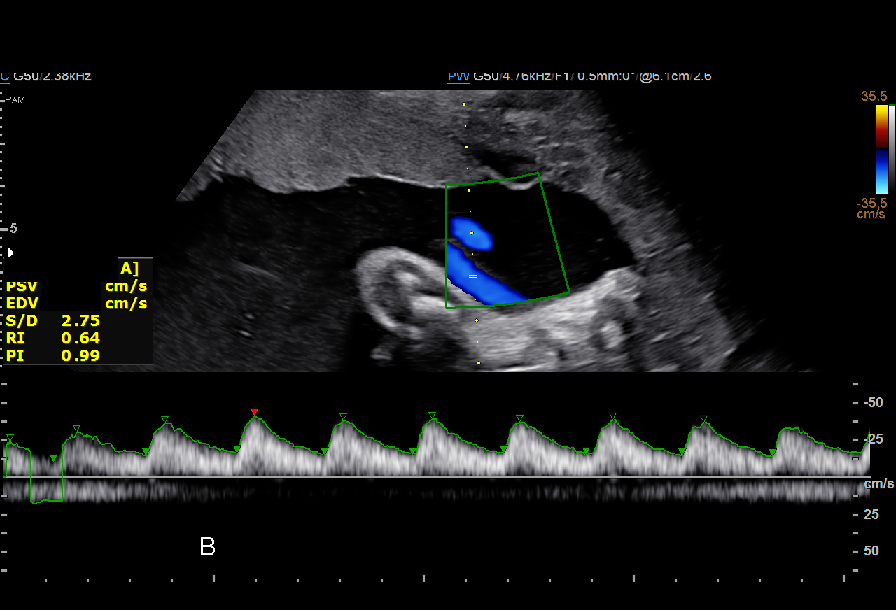
[im 16/20]
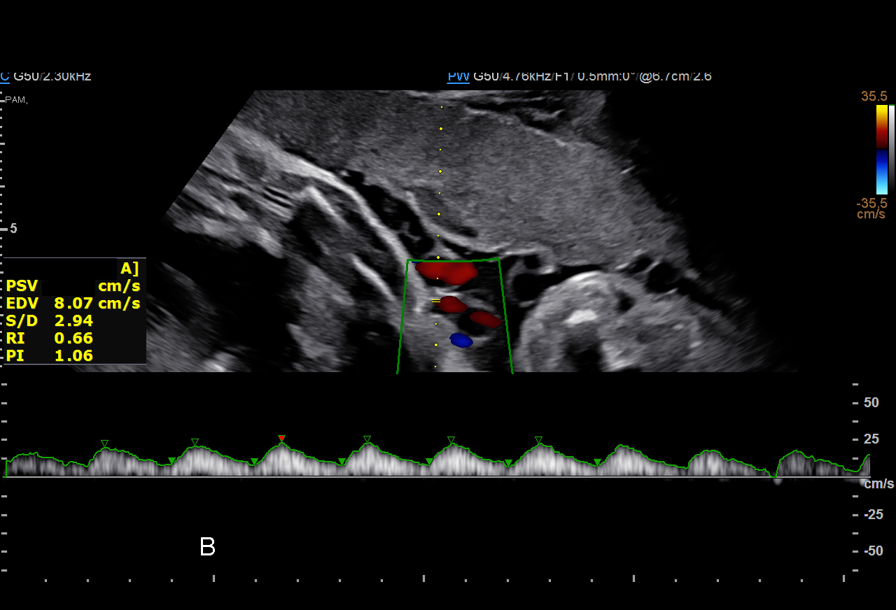
[im 18/20]
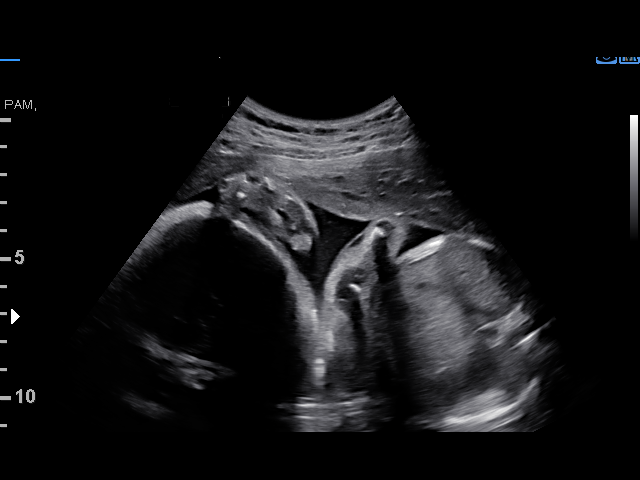
[im 20/20]
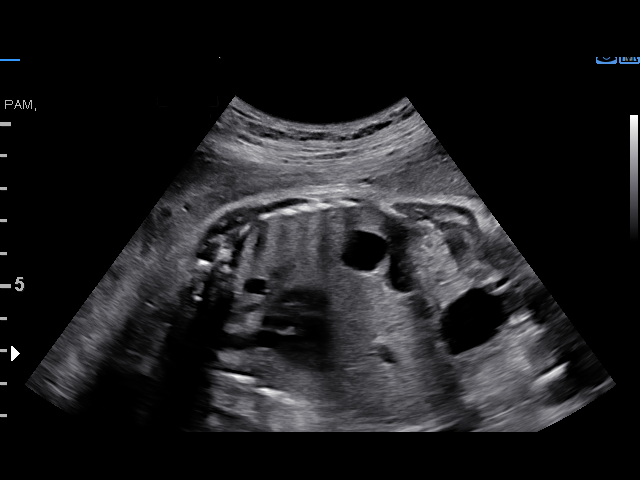

[12 of 20 positions shown; findings below may reference images not displayed]

1  US MFM UA CORD DOPPLER               76820.02     GHISLAINE
                                                       AKUFUNA
  2  US MFM UA DOPPLER ADDL               76820.03     GHISLAINE
     KLPIGBB RE EVAL                                      AKUFUNA
 ----------------------------------------------------------------------

 ----------------------------------------------------------------------
Indications

  31 weeks gestation of pregnancy
 ----------------------------------------------------------------------
Vital Signs

                                                Height:        5'6"
Fetal Evaluation (Fetus A)

 Num Of Fetuses:          2
 Fetal Heart Rate(bpm):   130
 Cardiac Activity:        Observed
 Fetal Lie:               Lower Fetus
 Presentation:            Cephalic

 Amniotic Fluid
 AFI FV:      Within normal limits

                             Largest Pocket(cm)

Gestational Age (Fetus A)

 LMP:           35w 2d        Date:  08/12/18                 EDD:   05/19/19
 Best:          31w 5d     Det. By:  Previous Ultrasound      EDD:   06/13/19
                                     (12/23/18)
Anatomy (Fetus A)

 Thoracic:              Pericardial
                        effusion
Doppler - Fetal Vessels (Fetus A)

 Umbilical Artery
  S/D     %tile     RI              PI
 2.22       22

Fetal Evaluation (Fetus B)

 Num Of Fetuses:          2
 Fetal Heart Rate(bpm):   150
 Cardiac Activity:        Observed
 Fetal Lie:               Upper Fetus
 Presentation:            Transverse, head to maternal right

 Amniotic Fluid
 AFI FV:      Within normal limits

                             Largest Pocket(cm)

Gestational Age (Fetus B)

 LMP:           35w 2d        Date:  08/12/18                 EDD:   05/19/19
 Best:          31w 5d     Det. By:  Previous Ultrasound      EDD:   06/13/19
                                     (12/23/18)
Doppler - Fetal Vessels (Fetus B)

 Umbilical Artery
  S/D     %tile     RI              PI
 2.86       57

Impression

 Twin A-pericardial effusion, normal UA Dopplers
 Twin B Normal UA Dopplers
Recommendations

 Follow up antenatal testing in 1 week

## 2019-09-20 ENCOUNTER — Emergency Department (HOSPITAL_COMMUNITY)
Admission: EM | Admit: 2019-09-20 | Discharge: 2019-09-20 | Discharge status: Absent without leave | Payer: Medicaid Other

## 2019-09-30 ENCOUNTER — Emergency Department (HOSPITAL_COMMUNITY)
Admission: EM | Admit: 2019-09-30 | Discharge: 2019-09-30 | Disposition: A | Discharge status: Home / Self Care | Payer: Medicaid Other | Attending: Emergency Medicine | Admitting: Emergency Medicine

## 2019-09-30 ENCOUNTER — Other Ambulatory Visit: Payer: Self-pay

## 2019-09-30 DIAGNOSIS — F1423 Cocaine dependence with withdrawal: Secondary | ICD-10-CM | POA: Diagnosis not present

## 2019-09-30 DIAGNOSIS — F17210 Nicotine dependence, cigarettes, uncomplicated: Secondary | ICD-10-CM | POA: Diagnosis not present

## 2019-09-30 DIAGNOSIS — R5383 Other fatigue: Secondary | ICD-10-CM | POA: Diagnosis present

## 2019-09-30 LAB — COMPREHENSIVE METABOLIC PANEL
ALT: 10 U/L (ref 0–44)
AST: 19 U/L (ref 15–41)
Albumin: 4.3 g/dL (ref 3.5–5.0)
Alkaline Phosphatase: 48 U/L (ref 38–126)
Anion gap: 12 (ref 5–15)
BUN: 13 mg/dL (ref 6–20)
CO2: 19 mmol/L — ABNORMAL LOW (ref 22–32)
Calcium: 9.7 mg/dL (ref 8.9–10.3)
Chloride: 103 mmol/L (ref 98–111)
Creatinine, Ser: 0.84 mg/dL (ref 0.44–1.00)
GFR calc Af Amer: >60 mL/min (ref >60)
GFR calc non Af Amer: >60 mL/min (ref >60)
Glucose, Bld: 99 mg/dL (ref 70–99)
Potassium: 3.5 mmol/L (ref 3.5–5.1)
Sodium: 134 mmol/L — ABNORMAL LOW (ref 135–145)
Total Bilirubin: 0.4 mg/dL (ref 0.3–1.2)
Total Protein: 7 g/dL (ref 6.5–8.1)

## 2019-09-30 LAB — CBC
HCT: 41.6 % (ref 36.0–46.0)
Hemoglobin: 14 g/dL (ref 12.0–15.0)
MCH: 30.5 pg (ref 26.0–34.0)
MCHC: 33.7 g/dL (ref 30.0–36.0)
MCV: 90.6 fL (ref 80.0–100.0)
Platelets: 362 10*3/uL (ref 150–400)
RBC: 4.59 MIL/uL (ref 3.87–5.11)
RDW: 12.4 % (ref 11.5–15.5)
WBC: 4.9 10*3/uL (ref 4.0–10.5)
nRBC: 0 % (ref 0.0–0.2)

## 2019-09-30 LAB — I-STAT BETA HCG BLOOD, ED (MC, WL, AP ONLY): I-stat hCG, quantitative: <5 m[IU]/mL (ref <5)

## 2019-09-30 LAB — LIPASE, BLOOD: Lipase: 31 U/L (ref 11–51)

## 2019-09-30 MED ORDER — LORAZEPAM 1 MG PO TABS
1.0000 mg | ORAL_TABLET | Freq: Once | ORAL | Status: AC
  Administered 2019-09-30: 1 mg via ORAL
  Filled 2019-09-30: qty 1

## 2019-09-30 MED ORDER — IBUPROFEN 400 MG PO TABS
600.0000 mg | ORAL_TABLET | Freq: Once | ORAL | Status: AC
  Administered 2019-09-30: 600 mg via ORAL
  Filled 2019-09-30: qty 1

## 2019-09-30 MED ORDER — ONDANSETRON 4 MG PO TBDP
4.0000 mg | ORAL_TABLET | Freq: Once | ORAL | Status: AC
  Administered 2019-09-30: 4 mg via ORAL
  Filled 2019-09-30: qty 1
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
## 2019-09-30 NOTE — Discharge Instructions (Signed)
Cocaine withdrawal can cause various symptoms but is rarely life threatening. Please utilize the included resources for help with drug abuse.

## 2019-09-30 NOTE — ED Triage Notes (Signed)
Pt here for cold sweats, fatigue, nausea, diarrhea, and headache x 3 days. Denies fevers or sick contacts.

## 2021-11-01 ENCOUNTER — Encounter (HOSPITAL_COMMUNITY): Payer: Self-pay

## 2021-11-01 ENCOUNTER — Ambulatory Visit (INDEPENDENT_AMBULATORY_CARE_PROVIDER_SITE_OTHER): Payer: No Payment, Other | Admitting: Psychiatry

## 2021-11-01 DIAGNOSIS — F411 Generalized anxiety disorder: Secondary | ICD-10-CM

## 2021-11-01 DIAGNOSIS — F410 Panic disorder [episodic paroxysmal anxiety] without agoraphobia: Secondary | ICD-10-CM | POA: Diagnosis not present

## 2021-11-01 DIAGNOSIS — F331 Major depressive disorder, recurrent, moderate: Secondary | ICD-10-CM | POA: Insufficient documentation

## 2021-11-01 MED ORDER — GABAPENTIN 100 MG PO CAPS: 100.0000 mg | ORAL_CAPSULE | Freq: Three times a day (TID) | ORAL | 2 refills | Status: DC

## 2021-11-01 MED ORDER — FLUOXETINE HCL 20 MG PO CAPS: 20.0000 mg | ORAL_CAPSULE | Freq: Every day | ORAL | 2 refills | Status: DC
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
## 2021-11-26 ENCOUNTER — Encounter (HOSPITAL_COMMUNITY): Payer: Self-pay

## 2022-01-03 ENCOUNTER — Telehealth (HOSPITAL_COMMUNITY): Payer: No Payment, Other | Admitting: Psychiatry

## 2022-01-04 ENCOUNTER — Telehealth (HOSPITAL_COMMUNITY): Payer: No Payment, Other | Admitting: Psychiatry

## 2022-01-04 ENCOUNTER — Encounter (HOSPITAL_COMMUNITY): Payer: Self-pay

## 2022-07-06 ENCOUNTER — Encounter (HOSPITAL_COMMUNITY): Payer: Self-pay

## 2022-07-06 ENCOUNTER — Emergency Department (HOSPITAL_COMMUNITY)
Admission: EM | Admit: 2022-07-06 | Discharge: 2022-07-06 | Disposition: A | Discharge status: Home / Self Care | Payer: Self-pay | Attending: Emergency Medicine | Admitting: Emergency Medicine

## 2022-07-06 ENCOUNTER — Other Ambulatory Visit: Payer: Self-pay

## 2022-07-06 ENCOUNTER — Emergency Department (HOSPITAL_COMMUNITY): Payer: Self-pay

## 2022-07-06 DIAGNOSIS — F1910 Other psychoactive substance abuse, uncomplicated: Secondary | ICD-10-CM

## 2022-07-06 DIAGNOSIS — R464 Slowness and poor responsiveness: Secondary | ICD-10-CM | POA: Insufficient documentation

## 2022-07-06 DIAGNOSIS — R4182 Altered mental status, unspecified: Secondary | ICD-10-CM | POA: Insufficient documentation

## 2022-07-06 LAB — RAPID URINE DRUG SCREEN, HOSP PERFORMED
Amphetamines: POSITIVE — AB
Barbiturates: NOT DETECTED
Benzodiazepines: NOT DETECTED
Cocaine: POSITIVE — AB
Opiates: NOT DETECTED
Tetrahydrocannabinol: POSITIVE — AB

## 2022-07-06 LAB — CBC WITH DIFFERENTIAL/PLATELET
Abs Immature Granulocytes: 0.01 10*3/uL (ref 0.00–0.07)
Basophils Absolute: 0 10*3/uL (ref 0.0–0.1)
Basophils Relative: 1 %
Eosinophils Absolute: 0.3 10*3/uL (ref 0.0–0.5)
Eosinophils Relative: 4 %
HCT: 35.3 % — ABNORMAL LOW (ref 36.0–46.0)
Hemoglobin: 11.5 g/dL — ABNORMAL LOW (ref 12.0–15.0)
Immature Granulocytes: 0 %
Lymphocytes Relative: 38 %
Lymphs Abs: 2.5 10*3/uL (ref 0.7–4.0)
MCH: 29.3 pg (ref 26.0–34.0)
MCHC: 32.6 g/dL (ref 30.0–36.0)
MCV: 90.1 fL (ref 80.0–100.0)
Monocytes Absolute: 0.5 10*3/uL (ref 0.1–1.0)
Monocytes Relative: 7 %
Neutro Abs: 3.4 10*3/uL (ref 1.7–7.7)
Neutrophils Relative %: 50 %
Platelets: 382 10*3/uL (ref 150–400)
RBC: 3.92 MIL/uL (ref 3.87–5.11)
RDW: 12.8 % (ref 11.5–15.5)
WBC: 6.6 10*3/uL (ref 4.0–10.5)
nRBC: 0 % (ref 0.0–0.2)

## 2022-07-06 LAB — ETHANOL: Alcohol, Ethyl (B): <10 mg/dL (ref <10)

## 2022-07-06 LAB — BASIC METABOLIC PANEL
Anion gap: 7 (ref 5–15)
BUN: 10 mg/dL (ref 6–20)
CO2: 25 mmol/L (ref 22–32)
Calcium: 8.9 mg/dL (ref 8.9–10.3)
Chloride: 106 mmol/L (ref 98–111)
Creatinine, Ser: 0.7 mg/dL (ref 0.44–1.00)
GFR, Estimated: 58 mL/min — ABNORMAL LOW (ref >60)
Glucose, Bld: 115 mg/dL — ABNORMAL HIGH (ref 70–99)
Potassium: 3.6 mmol/L (ref 3.5–5.1)
Sodium: 138 mmol/L (ref 135–145)

## 2022-07-06 LAB — I-STAT BETA HCG BLOOD, ED (MC, WL, AP ONLY): I-stat hCG, quantitative: <5 m[IU]/mL (ref <5)

## 2022-07-06 MED ORDER — NALOXONE HCL 0.4 MG/ML IJ SOLN
0.4000 mg | Freq: Once | INTRAMUSCULAR | Status: DC
  Filled 2022-07-06: qty 1

## 2022-07-06 MED ORDER — LACTATED RINGERS IV BOLUS
1000.0000 mL | Freq: Once | INTRAVENOUS | Status: AC
  Administered 2022-07-06: 1000 mL via INTRAVENOUS

## 2022-07-06 NOTE — ED Notes (Signed)
Pt ambulated out of room with a steady gait.

## 2022-07-06 NOTE — ED Provider Notes (Signed)
At change of shift this patient was signed out to me after having arrived with altered mental status thought to be drug related.  According to the family member who is at the bedside she states that she was out partying with friends, she does not know what she took but thinks that she was taking some kind of drugs.  The patient's laboratory work-up in fact reveals multiple different drugs in the drug screen including cocaine.  CT scan negative, labs otherwise unremarkable, the patient has remained with totally normal vital signs but continues to be somnolent and difficult to arouse.  She is gradually improving and is actually ambulated to the bathroom with the assistance of the nurse.  The patient will need to improve slightly more before going home in the care of her family members.  At this time at change of shift care will be signed out to Dr. Particia Nearing to follow-up the patient's improvement and disposition accordingly   Eber Hong, MD 07/06/22 1527
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
## 2022-07-06 NOTE — ED Notes (Signed)
Pt responding to voice commands at this time. Still unable to obtain a urine. Pt able to give name and DOB to staff. No longer requiring O2.

## 2022-07-06 NOTE — ED Notes (Signed)
Pt still drowy in room. HOB elevated and pt able to drink water.

## 2022-07-06 NOTE — ED Notes (Signed)
Pt woken up to use rest room. Pt ambulated with a steady gait to restroom. Urine obtained and sent to lab.
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
## 2022-07-06 NOTE — ED Notes (Signed)
Pt in room resting. NAD chest rising and falling.  

## 2022-07-06 NOTE — ED Provider Notes (Signed)
Pt is now more alert.  The family is here to get her and wants to take her home.  She is stable for d/c.  Return if worse.   Jacalyn Lefevre, MD 07/06/22 1537

## 2022-07-07 ENCOUNTER — Encounter (HOSPITAL_COMMUNITY): Payer: Self-pay

## 2022-10-13 ENCOUNTER — Other Ambulatory Visit: Payer: Self-pay

## 2022-10-13 ENCOUNTER — Emergency Department (HOSPITAL_COMMUNITY)
Admission: EM | Admit: 2022-10-13 | Discharge: 2022-10-13 | Discharge status: Against Medical Advice | Payer: Self-pay | Attending: Emergency Medicine | Admitting: Emergency Medicine

## 2022-10-13 ENCOUNTER — Encounter (HOSPITAL_COMMUNITY): Payer: Self-pay

## 2022-10-13 DIAGNOSIS — R1030 Lower abdominal pain, unspecified: Secondary | ICD-10-CM

## 2022-10-13 DIAGNOSIS — Z5321 Procedure and treatment not carried out due to patient leaving prior to being seen by health care provider: Secondary | ICD-10-CM | POA: Insufficient documentation

## 2022-10-13 DIAGNOSIS — R1032 Left lower quadrant pain: Secondary | ICD-10-CM | POA: Insufficient documentation

## 2022-10-13 DIAGNOSIS — R300 Dysuria: Secondary | ICD-10-CM | POA: Insufficient documentation

## 2022-10-13 DIAGNOSIS — R2241 Localized swelling, mass and lump, right lower limb: Secondary | ICD-10-CM | POA: Insufficient documentation

## 2022-10-13 LAB — BASIC METABOLIC PANEL
Anion gap: 9 (ref 5–15)
BUN: <5 mg/dL — ABNORMAL LOW (ref 6–20)
CO2: 24 mmol/L (ref 22–32)
Calcium: 9.3 mg/dL (ref 8.9–10.3)
Chloride: 101 mmol/L (ref 98–111)
Creatinine, Ser: 0.9 mg/dL (ref 0.44–1.00)
GFR, Estimated: >60 mL/min (ref >60)
Glucose, Bld: 103 mg/dL — ABNORMAL HIGH (ref 70–99)
Potassium: 4.4 mmol/L (ref 3.5–5.1)
Sodium: 134 mmol/L — ABNORMAL LOW (ref 135–145)

## 2022-10-13 LAB — URINALYSIS, ROUTINE W REFLEX MICROSCOPIC
Bilirubin Urine: NEGATIVE
Glucose, UA: NEGATIVE mg/dL
Hgb urine dipstick: NEGATIVE
Ketones, ur: NEGATIVE mg/dL
Leukocytes,Ua: NEGATIVE
Nitrite: NEGATIVE
Protein, ur: NEGATIVE mg/dL
Specific Gravity, Urine: 1.029 (ref 1.005–1.030)
pH: 6 (ref 5.0–8.0)

## 2022-10-13 LAB — CBC WITH DIFFERENTIAL/PLATELET
Abs Immature Granulocytes: 0.03 10*3/uL (ref 0.00–0.07)
Basophils Absolute: 0 10*3/uL (ref 0.0–0.1)
Basophils Relative: 0 %
Eosinophils Absolute: 0.3 10*3/uL (ref 0.0–0.5)
Eosinophils Relative: 2 %
HCT: 36.7 % (ref 36.0–46.0)
Hemoglobin: 12.3 g/dL (ref 12.0–15.0)
Immature Granulocytes: 0 %
Lymphocytes Relative: 15 %
Lymphs Abs: 1.9 10*3/uL (ref 0.7–4.0)
MCH: 30.8 pg (ref 26.0–34.0)
MCHC: 33.5 g/dL (ref 30.0–36.0)
MCV: 92 fL (ref 80.0–100.0)
Monocytes Absolute: 0.9 10*3/uL (ref 0.1–1.0)
Monocytes Relative: 7 %
Neutro Abs: 9.3 10*3/uL — ABNORMAL HIGH (ref 1.7–7.7)
Neutrophils Relative %: 76 %
Platelets: 352 10*3/uL (ref 150–400)
RBC: 3.99 MIL/uL (ref 3.87–5.11)
RDW: 12.6 % (ref 11.5–15.5)
WBC: 12.4 10*3/uL — ABNORMAL HIGH (ref 4.0–10.5)
nRBC: 0 % (ref 0.0–0.2)

## 2022-10-13 LAB — PREGNANCY, URINE: Preg Test, Ur: NEGATIVE

## 2022-10-13 NOTE — ED Notes (Signed)
Pt called several times for vitals, no answer. Also called for room and no answer.

## 2022-10-13 NOTE — ED Triage Notes (Signed)
Patient complains of left sided lower abd pain complains of middle dysuria denies vaginal discharge.  Denies n/v/d. LBM yesterday. Also complains of an abscess to back of right thigh.

## 2022-10-13 NOTE — ED Provider Triage Note (Signed)
Emergency Medicine Provider Triage Evaluation Note  Christy West , a 36 y.o. female  was evaluated in triage.  Pt complains of right leg swelling with tenderness and lower abdominal pain that radiates to the left side. She states the swelling and redness of her leg began a couple of days ago and has grown in size, has become severely painful.  Her abdominal pain is associated with dysuria.  History of tubal ligation.    Review of Systems  Positive: Dysuria, lower abdominal pain; right lower extremity redness, swelling, and pain Negative: Fever, nausea, vomiting, diarrhea  Physical Exam  BP (!) 127/100 (BP Location: Right Arm)   Pulse (!) 116   Temp 98.9 F (37.2 C) (Oral)   Resp 20   Ht 5\' 7"  (1.702 m)   Wt 65.8 kg   SpO2 100%   BMI 22.71 kg/m  Gen:   Awake, no distress   Resp:  Normal effort  MSK:   Moves extremities without difficulty  Other:  Abdomen soft, tender in LLQ and suprapubic region; posterior R thigh indurated, erythematous, tender with swelling and fluctuance   Medical Decision Making  Medically screening exam initiated at 1:22 PM.  Appropriate orders placed.  Rosanna Randy was informed that the remainder of the evaluation will be completed by another provider, this initial triage assessment does not replace that evaluation, and the importance of remaining in the ED until their evaluation is complete.     Theressa Stamps R, [place] 10/13/22 1328

## 2022-10-14 ENCOUNTER — Other Ambulatory Visit: Payer: Self-pay

## 2022-10-14 ENCOUNTER — Encounter (HOSPITAL_COMMUNITY): Payer: Self-pay

## 2022-10-14 ENCOUNTER — Emergency Department (HOSPITAL_BASED_OUTPATIENT_CLINIC_OR_DEPARTMENT_OTHER): Payer: Self-pay

## 2022-10-14 ENCOUNTER — Inpatient Hospital Stay (HOSPITAL_BASED_OUTPATIENT_CLINIC_OR_DEPARTMENT_OTHER)
Admission: EM | Admit: 2022-10-14 | Discharge: 2022-10-15 | DRG: 872 | Discharge status: Against Medical Advice | Payer: Self-pay | Attending: Internal Medicine | Admitting: Internal Medicine

## 2022-10-14 ENCOUNTER — Encounter (HOSPITAL_BASED_OUTPATIENT_CLINIC_OR_DEPARTMENT_OTHER): Payer: Self-pay

## 2022-10-14 DIAGNOSIS — Z87891 Personal history of nicotine dependence: Secondary | ICD-10-CM

## 2022-10-14 DIAGNOSIS — R300 Dysuria: Secondary | ICD-10-CM

## 2022-10-14 DIAGNOSIS — A419 Sepsis, unspecified organism: Principal | ICD-10-CM

## 2022-10-14 DIAGNOSIS — L03116 Cellulitis of left lower limb: Secondary | ICD-10-CM

## 2022-10-14 DIAGNOSIS — L03115 Cellulitis of right lower limb: Secondary | ICD-10-CM | POA: Diagnosis present

## 2022-10-14 DIAGNOSIS — L0291 Cutaneous abscess, unspecified: Secondary | ICD-10-CM

## 2022-10-14 DIAGNOSIS — Z5329 Procedure and treatment not carried out because of patient's decision for other reasons: Secondary | ICD-10-CM | POA: Diagnosis present

## 2022-10-14 DIAGNOSIS — N898 Other specified noninflammatory disorders of vagina: Secondary | ICD-10-CM | POA: Diagnosis present

## 2022-10-14 DIAGNOSIS — L0390 Cellulitis, unspecified: Secondary | ICD-10-CM | POA: Diagnosis present

## 2022-10-14 LAB — BASIC METABOLIC PANEL
Anion gap: 9 (ref 5–15)
BUN: 9 mg/dL (ref 6–20)
CO2: 27 mmol/L (ref 22–32)
Calcium: 9.3 mg/dL (ref 8.9–10.3)
Chloride: 100 mmol/L (ref 98–111)
Creatinine, Ser: 0.79 mg/dL (ref 0.44–1.00)
GFR, Estimated: >60 mL/min (ref >60)
Glucose, Bld: 71 mg/dL (ref 70–99)
Potassium: 3.5 mmol/L (ref 3.5–5.1)
Sodium: 136 mmol/L (ref 135–145)

## 2022-10-14 LAB — CBC WITH DIFFERENTIAL/PLATELET
Abs Immature Granulocytes: 0.04 10*3/uL (ref 0.00–0.07)
Basophils Absolute: 0 10*3/uL (ref 0.0–0.1)
Basophils Relative: 0 %
Eosinophils Absolute: 0.1 10*3/uL (ref 0.0–0.5)
Eosinophils Relative: 1 %
HCT: 36.7 % (ref 36.0–46.0)
Hemoglobin: 12.2 g/dL (ref 12.0–15.0)
Immature Granulocytes: 0 %
Lymphocytes Relative: 15 %
Lymphs Abs: 1.8 10*3/uL (ref 0.7–4.0)
MCH: 30 pg (ref 26.0–34.0)
MCHC: 33.2 g/dL (ref 30.0–36.0)
MCV: 90.4 fL (ref 80.0–100.0)
Monocytes Absolute: 1.1 10*3/uL — ABNORMAL HIGH (ref 0.1–1.0)
Monocytes Relative: 10 %
Neutro Abs: 8.9 10*3/uL — ABNORMAL HIGH (ref 1.7–7.7)
Neutrophils Relative %: 74 %
Platelets: 315 10*3/uL (ref 150–400)
RBC: 4.06 MIL/uL (ref 3.87–5.11)
RDW: 12.6 % (ref 11.5–15.5)
WBC: 12.1 10*3/uL — ABNORMAL HIGH (ref 4.0–10.5)
nRBC: 0 % (ref 0.0–0.2)

## 2022-10-14 LAB — LACTIC ACID, PLASMA
Lactic Acid, Venous: 1.6 mmol/L (ref 0.5–1.9)
Lactic Acid, Venous: 1.8 mmol/L (ref 0.5–1.9)

## 2022-10-14 LAB — WET PREP, GENITAL
Clue Cells Wet Prep HPF POC: NONE SEEN
Sperm: NONE SEEN
Trich, Wet Prep: NONE SEEN
WBC, Wet Prep HPF POC: <10 (ref <10)
Yeast Wet Prep HPF POC: NONE SEEN

## 2022-10-14 LAB — HCG, SERUM, QUALITATIVE: Preg, Serum: NEGATIVE

## 2022-10-14 MED ORDER — IOHEXOL 300 MG/ML  SOLN
100.0000 mL | Freq: Once | INTRAMUSCULAR | Status: AC | PRN
  Administered 2022-10-14: 80 mL via INTRAVENOUS

## 2022-10-14 MED ORDER — ACETAMINOPHEN 325 MG PO TABS
650.0000 mg | ORAL_TABLET | Freq: Once | ORAL | Status: AC
  Administered 2022-10-14: 650 mg via ORAL
  Filled 2022-10-14: qty 2

## 2022-10-14 MED ORDER — LIDOCAINE HCL (PF) 1 % IJ SOLN
1.0000 mL | Freq: Once | INTRAMUSCULAR | Status: AC
  Administered 2022-10-14: 1 mL
  Filled 2022-10-14: qty 5

## 2022-10-14 MED ORDER — VANCOMYCIN HCL IN DEXTROSE 1-5 GM/200ML-% IV SOLN
1000.0000 mg | Freq: Two times a day (BID) | INTRAVENOUS | Status: DC
  Administered 2022-10-14 – 2022-10-15 (×3): 1000 mg via INTRAVENOUS
  Filled 2022-10-14 (×4): qty 200

## 2022-10-14 MED ORDER — SODIUM CHLORIDE 0.9 % IV BOLUS: 1000.0000 mL | Freq: Once | INTRAVENOUS | Status: DC

## 2022-10-14 MED ORDER — ACETAMINOPHEN 325 MG PO TABS: 650.0000 mg | ORAL_TABLET | Freq: Four times a day (QID) | ORAL | Status: DC | PRN

## 2022-10-14 MED ORDER — DOXYCYCLINE HYCLATE 100 MG PO TABS
100.0000 mg | ORAL_TABLET | Freq: Once | ORAL | Status: AC
  Administered 2022-10-14: 100 mg via ORAL
  Filled 2022-10-14: qty 1

## 2022-10-14 MED ORDER — PIPERACILLIN-TAZOBACTAM 3.375 G IVPB 30 MIN
3.3750 g | Freq: Once | INTRAVENOUS | Status: AC
  Administered 2022-10-14: 3.375 g via INTRAVENOUS
  Filled 2022-10-14: qty 50

## 2022-10-14 MED ORDER — PIPERACILLIN-TAZOBACTAM 3.375 G IVPB
3.3750 g | Freq: Three times a day (TID) | INTRAVENOUS | Status: DC
  Administered 2022-10-14 – 2022-10-15 (×2): 3.375 g via INTRAVENOUS
  Filled 2022-10-14 (×2): qty 50

## 2022-10-14 MED ORDER — SODIUM CHLORIDE 0.9 % IV SOLN: INTRAVENOUS | Status: DC | PRN

## 2022-10-14 MED ORDER — CEFTRIAXONE SODIUM 500 MG IJ SOLR
500.0000 mg | Freq: Once | INTRAMUSCULAR | Status: AC
  Administered 2022-10-14: 500 mg via INTRAMUSCULAR
  Filled 2022-10-14: qty 500

## 2022-10-14 NOTE — Assessment & Plan Note (Signed)
Patient reports dysuria and was noted to have white vaginal discharge. -Urine pregnancy test was negative. -UA, wet prep for yeast, Trich, clue cells were negative. GC/Chlamydia swab is pending. She was treated empirically with doxycycline and Rocephin in ED. Will await results which should return tomorrow.

## 2022-10-14 NOTE — Progress Notes (Signed)
Pharmacy Antibiotic Note  Christy West is a 36 y.o. female admitted on 10/14/2022 with  wound infection .  Pharmacy has been consulted for vancomycin and zosyn dosing. Pt is afebrile and WBC was elevated yesterday. Scr yesterday was WNL.   Plan: Zosyn 3.375g IV Q8H (4 hr inf) Vancomycin 1gm IV Q12H F/u renal fxn, C&S, clinical status and peak/trough at [facility] De-escalate as able  Height: 5\' 7"  (170.2 cm) Weight: 65.8 kg (145 lb 1 oz) IBW/kg (Calculated) : 61.6  Temp (24hrs), Avg:97.7 F (36.5 C), Min:97.7 F (36.5 C), Max:97.7 F (36.5 C)  Recent Labs  Lab 10/13/22 1325  WBC 12.4*  CREATININE 0.90    Estimated Creatinine Clearance: 85.7 mL/min (by C-G formula based on SCr of 0.9 mg/dL).    No Known Allergies  Antimicrobials this admission: Vanc 11/3>> Zosyn 11/3>>  Dose adjustments this admission: N/A  Microbiology results: Pending  Thank you for allowing pharmacy to be a part of this patient's care.  Niah Heinle, Rande Lawman 10/14/2022 1:49 PM

## 2022-10-14 NOTE — ED Notes (Signed)
Pt, belongings, paperwork transferred at this time to [facility] via [facility]. Receiving RN made aware.
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
## 2022-10-14 NOTE — ED Provider Notes (Signed)
Care was taken over from Dr. Pearline Cables.  Patient presented with a notable swelling to her thigh.  She had a CT scan which shows diffuse cellulitis.  No drainable abscess.  There is potentially an early forming abscess.  She was noted to develop a temperature of 100.5 in the ED.  She was given IV antibiotics by Dr. Pearline Cables.  This point she said that she is can have trouble affording oral antibiotics.  I discussed admitting her for IV antibiotics and she is amenable to this.  I spoke with Dr. Karleen Hampshire who will admit the patient for further treatment.   Malvin Johns, MD 10/14/22 2032

## 2022-10-14 NOTE — Assessment & Plan Note (Signed)
Presented with fever, tachycardia and leukocytosis with severe cellulitis of the right posterior thigh seen on CT -Continue IV vancomycin and Zosyn -Give IV fluid bolus

## 2022-10-14 NOTE — Progress Notes (Signed)
36 year old lady is being admitted for left lower extremity cellulitis.  She was started on IV antibiotics. She is referred to [facility] for admission.     Hosie Poisson , MD

## 2022-10-14 NOTE — ED Triage Notes (Signed)
Patient here POV from Home.  Endorses ABD Pain in Lower Regions that began Monday. Worsened since then. No N/V/D. Some form of Dysuria.   Also endorses an Abscess like Formation to Right Posterior Leg for same duration.   Was at another ED Yesterday but LWBS.   NAD Noted during Triage, A&Ox4. GCS 15. Ambulatory.

## 2022-10-14 NOTE — Plan of Care (Signed)
  Problem: Education: Goal: Knowledge of General Education information will improve Description Including pain rating scale, medication(s)/side effects and non-pharmacologic comfort measures Outcome: Progressing   

## 2022-10-14 NOTE — ED Provider Notes (Signed)
[facility] EMERGENCY DEPT Provider Note   CSN: [id number] Arrival date & time: 10/14/22  1322     History {Add pertinent medical, surgical, social history, OB history to HPI:1} Chief Complaint  Patient presents with   Abdominal Pain    Christy West is a 36 y.o. female.  Patient is a 36 year old female presenting for 2 complaints.  First patient endorses lower abdominal pain.  Stating " is either a UTI or an STD.  I have had these symptoms several times in the past."  She admits to lower abdominal pain over the last several days but states she just had dysuria on arrival to emergency department.  Denies any vaginal discharge, vaginal itching, vaginal lesions/ rashes or pain with sexual intercourse.  Patient denies any fevers, chills, nausea, vomiting.  Patient also is presenting for swelling to the right back of her thigh has been developing over the last several days.  Admits to purulent drainage and heat from the tissue area.  The history is provided by the patient. No language interpreter was used.  Abdominal Pain Associated symptoms: dysuria   Associated symptoms: no chest pain, no chills, no cough, no fever, no hematuria, no shortness of breath, no sore throat and no vomiting        Home Medications Prior to Admission medications   Medication Sig Start Date End Date Taking? Authorizing Provider  FLUoxetine (PROZAC) 20 MG capsule Take 1 capsule (20 mg total) by mouth daily. 11/01/21 11/01/22  Patrecia Pour, NP  gabapentin (NEURONTIN) 100 MG capsule Take 1 capsule (100 mg total) by mouth 3 (three) times daily. 11/01/21 12/01/21  Patrecia Pour, NP      Allergies    Patient has no known allergies.    Review of Systems   Review of Systems  Constitutional:  Negative for chills and fever.  HENT:  Negative for ear pain and sore throat.   Eyes:  Negative for pain and visual disturbance.  Respiratory:  Negative for cough and shortness of breath.    Cardiovascular:  Negative for chest pain and palpitations.  Gastrointestinal:  Positive for abdominal pain. Negative for vomiting.  Genitourinary:  Positive for dysuria. Negative for hematuria.  Musculoskeletal:  Negative for arthralgias and back pain.  Skin:  Negative for color change and rash.  Neurological:  Negative for seizures and syncope.  All other systems reviewed and are negative.   Physical Exam Updated Vital Signs BP 116/80 (BP Location: Right Arm)   Pulse (!) 111   Temp 97.7 F (36.5 C) (Oral)   Resp 18   Ht 5\' 7"  (1.702 m)   Wt 65.8 kg   SpO2 100%   BMI 22.72 kg/m  Physical Exam Vitals and nursing note reviewed. Exam conducted with a chaperone present.  Constitutional:      General: She is not in acute distress.    Appearance: She is well-developed.  HENT:     Head: Normocephalic and atraumatic.  Eyes:     Conjunctiva/sclera: Conjunctivae normal.  Cardiovascular:     Rate and Rhythm: Normal rate and regular rhythm.     Heart sounds: No murmur heard. Pulmonary:     Effort: Pulmonary effort is normal. No respiratory distress.     Breath sounds: Normal breath sounds.  Abdominal:     Palpations: Abdomen is soft.     Tenderness: There is no abdominal tenderness.  Genitourinary:    Vagina: No foreign body. Vaginal discharge present. No erythema, tenderness or bleeding.  Cervix: Normal.     Comments: Thick white discharge present Musculoskeletal:        General: No swelling.     Cervical back: Neck supple.       Legs:  Skin:    General: Skin is warm and dry.     Capillary Refill: Capillary refill takes less than 2 seconds.  Neurological:     Mental Status: She is alert.  Psychiatric:        Mood and Affect: Mood normal.     ED Results / Procedures / Treatments   Labs (all labs ordered are listed, but only abnormal results are displayed) Labs Reviewed  CBC WITH DIFFERENTIAL/PLATELET - Abnormal; Notable for the following components:      Result  Value   WBC 12.1 (*)    Neutro Abs 8.9 (*)    Monocytes Absolute 1.1 (*)    All other components within normal limits  AEROBIC CULTURE W GRAM STAIN (SUPERFICIAL SPECIMEN)  BASIC METABOLIC PANEL  HCG, SERUM, QUALITATIVE  URINALYSIS, ROUTINE W REFLEX MICROSCOPIC  GC/CHLAMYDIA PROBE AMP ([place]) NOT AT [facility]    EKG None  Radiology No results found.  Procedures Procedures  {Document cardiac monitor, telemetry assessment procedure when appropriate:1}  Medications Ordered in ED Medications  vancomycin (VANCOCIN) IVPB 1000 mg/200 mL premix (has no administration in time range)  piperacillin-tazobactam (ZOSYN) IVPB 3.375 g (has no administration in time range)  piperacillin-tazobactam (ZOSYN) IVPB 3.375 g (has no administration in time range)  iohexol (OMNIPAQUE) 300 MG/ML solution 100 mL (80 mLs Intravenous Contrast Given 10/14/22 1447)    ED Course/ Medical Decision Making/ A&P                           Medical Decision Making Amount and/or Complexity of Data Reviewed Labs: ordered. Radiology: ordered.  Risk Prescription drug management.   Patient is a 36 year old female presenting for 2 complaints.  First complaint including lower abdominal pain and dysuria.  Patient is alert and oriented x3, no acute distress, afebrile, tachycardia and otherwise stable vital signs.  Vaginal exam demonstrates thick white discharge.  Wet mount pending.  Testing for gonorrhea and chlamydia sent.  Patient requesting prophylactic treatment.  Rocephin and doxycycline given.  UA pending.  Patient also presenting for abscess to the right hamstrings.  Physical exam demonstrates Large region of swelling encompassing the entire posterior right hamstring region with a 2 x 2 focal area of induration and purulent drainage.  Erythema and warmth present.  Patient does have tachycardia of 111.  Due to the size of the abscess and her tachycardia of concerns for sepsis.  Laboratory studies were drawn and  demonstrates leukocytosis of 12.1.  Patient qualifies for sepsis.  Antibiotics were ordered including a mycin and Ancef.  Blood cultures and lactic acid sent.  CT lower extremity pending.  Signed out to oncoming provider while awaiting CT results.  {Document critical care time when appropriate:1} {Document review of labs and clinical decision tools ie heart score, Chads2Vasc2 etc:1}  {Document your independent review of radiology images, and any outside records:1} {Document your discussion with family members, caretakers, and with consultants:1} {Document social determinants of health affecting pt's care:1} {Document your decision making why or why not admission, treatments were needed:1} Final Clinical Impression(s) / ED Diagnoses Final diagnoses:  None    Rx / DC Orders ED Discharge Orders     None

## 2022-10-15 ENCOUNTER — Encounter (HOSPITAL_COMMUNITY): Payer: Self-pay | Admitting: Internal Medicine

## 2022-10-15 LAB — BASIC METABOLIC PANEL
Anion gap: 8 (ref 5–15)
BUN: 8 mg/dL (ref 6–20)
CO2: 24 mmol/L (ref 22–32)
Calcium: 8.8 mg/dL — ABNORMAL LOW (ref 8.9–10.3)
Chloride: 102 mmol/L (ref 98–111)
Creatinine, Ser: 0.65 mg/dL (ref 0.44–1.00)
GFR, Estimated: >60 mL/min (ref >60)
Glucose, Bld: 101 mg/dL — ABNORMAL HIGH (ref 70–99)
Potassium: 4 mmol/L (ref 3.5–5.1)
Sodium: 134 mmol/L — ABNORMAL LOW (ref 135–145)

## 2022-10-15 LAB — CBC
HCT: 39.1 % (ref 36.0–46.0)
Hemoglobin: 12.8 g/dL (ref 12.0–15.0)
MCH: 30 pg (ref 26.0–34.0)
MCHC: 32.7 g/dL (ref 30.0–36.0)
MCV: 91.6 fL (ref 80.0–100.0)
Platelets: 304 10*3/uL (ref 150–400)
RBC: 4.27 MIL/uL (ref 3.87–5.11)
RDW: 12.6 % (ref 11.5–15.5)
WBC: 12.7 10*3/uL — ABNORMAL HIGH (ref 4.0–10.5)
nRBC: 0 % (ref 0.0–0.2)

## 2022-10-15 LAB — HIV ANTIBODY (ROUTINE TESTING W REFLEX): HIV Screen 4th Generation wRfx: NONREACTIVE

## 2022-10-15 MED ORDER — SODIUM CHLORIDE 0.9 % IV SOLN
2.0000 g | INTRAVENOUS | Status: DC
  Administered 2022-10-15: 2 g via INTRAVENOUS
  Filled 2022-10-15: qty 20

## 2022-10-15 NOTE — Progress Notes (Signed)
Consultation Progress Note   Patient: Christy West FKC:[id number] DOB: December 29, 1985 DOA: 10/14/2022 DOS: the patient was seen and examined on 10/15/2022 Primary service: DibiaManfred Shirts, MD  Brief hospital course: 36 year old female presented with fever, tachycardia and leukocytosis with severe cellulitis of the right posterior thigh seen on CT she is on IV vancomycin and Zosyn and received IV bolus.  Patient reported dysuria and was noted to have white vaginal discharge.  Pregnancy test was negative, urinalysis was not concerning for UTI.  Trichomonas, clue cells were negative, GC/chlamydia swab pending.  Empirically treated with doxycycline and Rocephin at the ER.   UDS was positive for amphetamines, cocaine and cannabinoids.    Assessment and Plan: Sepsis-presented with sepsis secondary to right thigh cellulitis.  This patient for IV drug use given the positive UDS. Check for hepatitis consider HIV testing WBC count slightly bumped from yesterday.  Continue IV antibiotics.  Vaginal discharge-Trich, clue cells were negative, GC chlamydia pending.      [facility] will continue to follow the patient.  Subjective:  Continues to have right thigh pain  Physical Exam Constitutional:      Appearance: She is well-developed.  HENT:     Head: Normocephalic and atraumatic.  Eyes:     Extraocular Movements: Extraocular movements intact.     Pupils: Pupils are equal, round, and reactive to light.  Cardiovascular:     Rate and Rhythm: Normal rate and regular rhythm.  Pulmonary:     Effort: Pulmonary effort is normal.     Breath sounds: Normal breath sounds.  Abdominal:     General: Bowel sounds are normal.     Palpations: Abdomen is soft.  Skin:    General: Skin is warm.  Neurological:     General: No focal deficit present.     Mental Status: She is alert.  Psychiatric:        Mood and Affect: Mood normal.   Skin: Swelling on right posterior lower thigh, open wound with no discharge.  Tender to touch.  Vitals:   10/14/22 2000 10/14/22 2116 10/15/22 0146 10/15/22 0615  BP: 98/61 120/81 108/61 108/72  Pulse: 92 (!) 101 80 95  Resp: 18 16 16 16   Temp: 99.1 F (37.3 C) 98.4 F (36.9 C) 98.6 F (37 C) 98.5 F (36.9 C)  TempSrc:  Oral Oral Oral  SpO2: 99% 100% 98% 100%  Weight:      Height:        Data Reviewed:  There are no new results to review at this time.  Family Communication:   Time spent: 15 minutes.  Author: Cristela Felt, MD 10/15/2022 8:48 AM  For on call review [email or web address].

## 2022-10-15 NOTE — Progress Notes (Signed)
    Against Medical Advice  Patient at this time expresses desire to leave the Hospital immediately, patient has been warned that this is not Medically advisable at this time, and can result in Medical complications like Death and Disability, patient understands and accepts the risks involved and assumes full responsibilty of this decision.  This patient has also been advised that if they feel the need for further medical assistance to return to any available ER or dial [phone].  Informed by Nursing staff that this patient has left care and has signed the form  Against Medical Advice on 10/15/2022 at 2255.  Raenette Rover, DNP, [place]

## 2022-10-15 NOTE — Progress Notes (Signed)
The patient left AMA and stated it is because her daughter was being sent to [place].

## 2022-10-17 LAB — GC/CHLAMYDIA PROBE AMP (~~LOC~~) NOT AT ARMC
Chlamydia: POSITIVE — AB
Comment: NEGATIVE
Comment: NORMAL
Neisseria Gonorrhea: NEGATIVE

## 2022-10-18 NOTE — Discharge Summary (Signed)
Physician Discharge Summary   Patient: Christy West MRN: [id number] DOB: 10/02/1986  Admit date:     10/14/2022  Discharge date: 10/15/2022  Discharge Physician: Manfred Shirts Zohar Laing   PCP: Patient, No Pcp Per   Recommendations at discharge:    Follow up with PCP  Discharge Diagnoses: Principal Problem:   Sepsis ([place]) Active Problems:   Cellulitis   Vaginal discharge  Resolved Problems:   * No resolved hospital problems. *  Hospital Course: 36 year old female presented with fever, tachycardia and leukocytosis with severe cellulitis of the right posterior thigh seen on CT she is on IV vancomycin and Zosyn and received IV bolus.  Patient reported dysuria and was noted to have white vaginal discharge.  Pregnancy test was negative, urinalysis was not concerning for UTI.  Trichomonas, clue cells were negative, GC/chlamydia swab pending.  Empirically treated with doxycycline and Rocephin at the ER.   UDS was positive for amphetamines, cocaine and cannabinoids. Patient left against medical advise on 11/04.   Assessment and Plan: * Sepsis ([place]) Presented with fever, tachycardia and leukocytosis with severe cellulitis of the right posterior thigh seen on CT -Continue IV vancomycin and Zosyn -Give IV fluid bolus  Vaginal discharge Patient reports dysuria and was noted to have white vaginal discharge. -Urine pregnancy test was negative. -UA, wet prep for yeast, Trich, clue cells were negative. GC/Chlamydia swab is pending. She was treated empirically with doxycycline and Rocephin in ED. Will await results which should return tomorrow.         Consultants:  Procedures performed:   Disposition:  Diet recommendation:   DISCHARGE MEDICATION: Allergies as of 10/15/2022   No Known Allergies      Medication List     ASK your doctor about these medications    ibuprofen 200 MG tablet Commonly known as: ADVIL Take 400 mg by mouth every 6 (six) hours as needed for moderate pain.         Discharge Exam: Filed Weights   10/14/22 1327  Weight: 65.8 kg     Condition at discharge: stable  The results of significant diagnostics from this hospitalization (including imaging, microbiology, ancillary and laboratory) are listed below for reference.   Imaging Studies: CT EXTREMITY LOWER RIGHT W CONTRAST  Result Date: 10/14/2022 CLINICAL DATA:  Large abscess of the right hamstring. Wound infection of the posterior thigh. EXAM: CT OF THE LOWER RIGHT EXTREMITY WITH CONTRAST TECHNIQUE: Multidetector CT imaging of the lower right extremity was performed according to the standard protocol following intravenous contrast administration. RADIATION DOSE REDUCTION: This exam was performed according to the departmental dose-optimization program which includes automated exposure control, adjustment of the mA and/or kV according to patient size and/or use of iterative reconstruction technique. CONTRAST:  55mL OMNIPAQUE IOHEXOL 300 MG/ML  SOLN COMPARISON:  None Available. FINDINGS: Bones/Joint/Cartilage No fracture or dislocation. Normal alignment. No joint effusion. Ligaments Ligaments are suboptimally evaluated by CT. Muscles and Tendons Muscles are normal. No muscle atrophy. No intramuscular fluid collection or hematoma. Soft tissue No fluid collection or hematoma. No soft tissue mass. Severe soft tissue edema a subcutaneous fat with skin thickening involving the posterior lower thigh most concerning for cellulitis. Small nodular hypodense area within the area of swelling which may reflect a small developing abscess measuring 16 mm. IMPRESSION: 1. Severe soft tissue edema a subcutaneous fat with skin thickening involving the posterior lower thigh most concerning for cellulitis. Small nodular hypodense area within the area of swelling which may reflect a small developing abscess  measuring 16 mm. Electronically Signed   By: Elige Ko M.D.   On: 10/14/2022 15:13    Microbiology: Results for  orders placed or performed during the hospital encounter of 10/14/22  Wet prep, genital     Status: None   Collection Time: 10/14/22  3:16 PM  Result Value Ref Range Status   Yeast Wet Prep HPF POC NONE SEEN NONE SEEN Final   Trich, Wet Prep NONE SEEN NONE SEEN Final   Clue Cells Wet Prep HPF POC NONE SEEN NONE SEEN Final   WBC, Wet Prep HPF POC <10 <10 Final   Sperm NONE SEEN  Final    Comment: Performed at [facility], [address] [place], [place] [address]  Blood culture (routine x 2)     Status: None (Preliminary result)   Collection Time: 10/14/22  3:40 PM   Specimen: BLOOD LEFT FOREARM  Result Value Ref Range Status   Specimen Description   Final    BLOOD LEFT FOREARM Performed at [facility], [address] [place], [place] [address]    Special Requests   Final    BOTTLES DRAWN AEROBIC AND ANAEROBIC Blood Culture adequate volume Performed at [facility], [address] [place], [place] [address]    Culture   Final    NO GROWTH 4 DAYS Performed at [facility], [address] [place], [place] [address]    Report Status PENDING  Incomplete  Blood culture (routine x 2)     Status: None (Preliminary result)   Collection Time: 10/14/22  3:40 PM   Specimen: BLOOD RIGHT ARM  Result Value Ref Range Status   Specimen Description   Final    BLOOD RIGHT ARM Performed at [facility], [address] [place], [place] [address]    Special Requests   Final    BOTTLES DRAWN AEROBIC AND ANAEROBIC Blood Culture adequate volume Performed at [facility], [address], [place], [place] [address]    Culture   Final    NO GROWTH 4 DAYS Performed at [facility], [address] [place], [place] [address]    Report Status PENDING  Incomplete    Labs: CBC: Recent Labs  Lab 10/13/22 1325 10/14/22 1350 10/15/22 0338  WBC 12.4* 12.1* 12.7*  NEUTROABS 9.3* 8.9*   --   HGB 12.3 12.2 12.8  HCT 36.7 36.7 39.1  MCV 92.0 90.4 91.6  PLT 352 315 304   Basic Metabolic Panel: Recent Labs  Lab 10/13/22 1325 10/14/22 1350 10/15/22 0344  NA 134* 136 134*  K 4.4 3.5 4.0  CL 101 100 102  CO2 24 27 24   GLUCOSE 103* 71 101*  BUN <5* 9 8  CREATININE 0.90 0.79 0.65  CALCIUM 9.3 9.3 8.8*   Liver Function Tests: No results for input(s): "AST", "ALT", "ALKPHOS", "BILITOT", "PROT", "ALBUMIN" in the last 168 hours. CBG: No results for input(s): "GLUCAP" in the last 168 hours.  Discharge time spent: less than 30 minutes.  Signed: , MD [facility] 10/18/2022

## 2022-10-19 LAB — CULTURE, BLOOD (ROUTINE X 2)
Culture: NO GROWTH
Culture: NO GROWTH
Special Requests: ADEQUATE
Special Requests: ADEQUATE

## 2022-10-28 ENCOUNTER — Inpatient Hospital Stay (HOSPITAL_COMMUNITY)
Admission: AD | Admit: 2022-10-28 | Discharge: 2022-10-28 | Discharge status: Against Medical Advice | Payer: Self-pay | Attending: Obstetrics and Gynecology | Admitting: Obstetrics and Gynecology

## 2022-10-28 NOTE — Progress Notes (Signed)
Pt called and not in lobby. Registration states she told them she had to go talk with someone and had not returned when called for Triage

## 2022-10-28 NOTE — Progress Notes (Signed)
Pt not in lobby.  

## 2022-11-28 ENCOUNTER — Ambulatory Visit (HOSPITAL_BASED_OUTPATIENT_CLINIC_OR_DEPARTMENT_OTHER): Payer: Self-pay | Admitting: Family Medicine

## 2023-02-16 DIAGNOSIS — R300 Dysuria: Secondary | ICD-10-CM

## 2023-02-23 ENCOUNTER — Ambulatory Visit (HOSPITAL_COMMUNITY): Payer: No Payment, Other | Admitting: Mental Health

## 2023-02-23 ENCOUNTER — Encounter (HOSPITAL_COMMUNITY): Payer: Self-pay | Admitting: Mental Health

## 2023-02-23 DIAGNOSIS — F29 Unspecified psychosis not due to a substance or known physiological condition: Secondary | ICD-10-CM

## 2023-02-23 NOTE — Progress Notes (Signed)
Appointment from 02/23/2023 in [facility]    02/23/2023    0910 Last Filed Value  Step One: Remove Access    What might I use to hurt myself? Knife or razor Knife or razor  What other actions can I take to limit ways that I can hurt myself? Sister- Marijo Conception will take razors and put them up. Sister- Marijo Conception will take razors and put them up.  Step Two: Warning Signs    What are my specific triggers or warning signs a crisis is developing? Other:What are my specific triggers or warning signs a crisis is developing?. Other:. The comment is yelling and growling. Taken on 02/23/23 0910 Other:What are my specific triggers or warning signs a crisis is developing?. Other:. The comment is yelling and growling. Last Filed Value  Step Three: Distracting Activities    What engaging and calming activities can I do to distract myself? Watch television; Listen to music; Go for a walk, exercise Watch television; Listen to music; Go for a walk, exercise  Step Four: Distracting People and Places    Who helps me to take my mind off of my problems? [place] and seeing my kids [place] and seeing my kids  Where can I go to take my mind off my problems? Going to the park Going to the park  Step Five: Social Support    Which family members or friends can I talk to to help me feel better? Sister- [place] Sister- [place]  Who can help me get through the crisis? Marijo [phone] Marijo Conception [phone]  Who is supportive? [place] N[place]ica  Step Six: Professional Help    Where can I get help? [place]- 1st floor [place]- 1st floor [place]

## 2023-02-23 NOTE — Progress Notes (Signed)
Christy West is a 37 y.o. female patient is a 37 year old African-American female who presents for walk in assessment to engage in outpatient services. Christy West presented with odd bizzare demeanor. Incessantly eating during attempt for intake assessment. Shares thoughts of suicide with plan over the weekend. Shares history of self-harm via cutting and overdose on medications in the past. 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 Shares to see and hear "Christy West" and notes for Christy West to have blonde hair and inquires if therapist can see Christy West. Shares Christy West to be telling her shut up and Christy West is heard responding telling "Christy West" - "No you shut up." Due to presentation therapist suggest for Christy West to present to [facility] for further evaluation.   Therapist walked pt to [facility] for registration, in which pt returned upstairs and noted for her ride to be here and had to leave. Therapist inquired if she could speak to natural support- Christy West. Naveena gave verbal consent for therapist to speak to Christy West. Christy West stated for for [facility] to live with sister and sister is able to support Christy West during the day and home. Therapist educated of attempts for Christy West to self-harm over the weekend and reported frequent thoughts of suicide.   Safety plan completed and provided to pt and natural support Christy West). Agrees to present to [facility] or contact 911 for immediate safety concerns.   Appointments provided to return for CCA and psychiatric evaluation.      Collaboration of Care: Other Crisis services information provided.   Patient/Guardian was advised Release of Information must be obtained prior to any record release in order to collaborate their care with an outside provider. Patient/Guardian was advised if they have not already done so to contact the registration department to sign all necessary forms in order for [place] to release information regarding their care.   Consent: Patient/Guardian  gives verbal consent for treatment and assignment of benefits for services provided during this visit. Patient/Guardian expressed understanding and agreed to proceed.    Marion Downer, [facility]

## 2023-03-03 ENCOUNTER — Telehealth (HOSPITAL_COMMUNITY): Payer: Self-pay | Admitting: Mental Health

## 2023-03-03 NOTE — Telephone Encounter (Signed)
Therapist received message from front desk of pt request for a return call from therapist. Therapist contacted pt who inquired about getting engaged with services and appointments. Therapist educated on upcoming CCA and psych eval to engage in OPT and medication management services. Educated if needing earlier appointments of walk in hours. Pt also requested therapist to contact her probation officer - [place] and noted to have signed ROI; (ROI also reported to have been obtained by front desk). Therapist agreed with verbal confirmation of ROI received x 2 per pt and front desk.   Therapist contacted probation office at number provided Christy West @ [phone]. No answer; left message for return call.

## 2023-03-09 ENCOUNTER — Ambulatory Visit (INDEPENDENT_AMBULATORY_CARE_PROVIDER_SITE_OTHER): Payer: No Payment, Other | Admitting: Mental Health

## 2023-03-09 DIAGNOSIS — F411 Generalized anxiety disorder: Secondary | ICD-10-CM

## 2023-03-09 DIAGNOSIS — F250 Schizoaffective disorder, bipolar type: Secondary | ICD-10-CM

## 2023-03-09 DIAGNOSIS — F1310 Sedative, hypnotic or anxiolytic abuse, uncomplicated: Secondary | ICD-10-CM | POA: Insufficient documentation
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
## 2023-03-16 ENCOUNTER — Ambulatory Visit (HOSPITAL_COMMUNITY): Payer: No Payment, Other | Admitting: Physician Assistant

## 2023-03-21 ENCOUNTER — Telehealth (HOSPITAL_COMMUNITY): Payer: Self-pay | Admitting: Mental Health

## 2023-03-31 ENCOUNTER — Ambulatory Visit (HOSPITAL_COMMUNITY): Payer: No Payment, Other | Admitting: Mental Health

## 2023-05-17 ENCOUNTER — Encounter (HOSPITAL_BASED_OUTPATIENT_CLINIC_OR_DEPARTMENT_OTHER): Payer: Self-pay | Admitting: *Deleted

## 2023-05-17 ENCOUNTER — Other Ambulatory Visit: Payer: Self-pay

## 2023-05-17 ENCOUNTER — Other Ambulatory Visit (HOSPITAL_BASED_OUTPATIENT_CLINIC_OR_DEPARTMENT_OTHER): Payer: Self-pay

## 2023-05-17 ENCOUNTER — Emergency Department (HOSPITAL_BASED_OUTPATIENT_CLINIC_OR_DEPARTMENT_OTHER)
Admission: EM | Admit: 2023-05-17 | Discharge: 2023-05-17 | Disposition: A | Discharge status: Home / Self Care | Payer: Commercial Managed Care - HMO | Attending: Emergency Medicine | Admitting: Emergency Medicine

## 2023-05-17 DIAGNOSIS — W460XXA Contact with hypodermic needle, initial encounter: Secondary | ICD-10-CM | POA: Insufficient documentation

## 2023-05-17 DIAGNOSIS — Z7721 Contact with and (suspected) exposure to potentially hazardous body fluids: Secondary | ICD-10-CM | POA: Diagnosis present

## 2023-05-17 DIAGNOSIS — Z113 Encounter for screening for infections with a predominantly sexual mode of transmission: Secondary | ICD-10-CM | POA: Diagnosis not present

## 2023-05-17 DIAGNOSIS — W461XXA Contact with contaminated hypodermic needle, initial encounter: Secondary | ICD-10-CM

## 2023-05-17 LAB — URINALYSIS, ROUTINE W REFLEX MICROSCOPIC
Bilirubin Urine: NEGATIVE
Glucose, UA: NEGATIVE mg/dL
Ketones, ur: NEGATIVE mg/dL
Leukocytes,Ua: NEGATIVE
Nitrite: NEGATIVE
Protein, ur: NEGATIVE mg/dL
Specific Gravity, Urine: 1.025 (ref 1.005–1.030)
pH: 6.5 (ref 5.0–8.0)

## 2023-05-17 LAB — URINALYSIS, MICROSCOPIC (REFLEX)

## 2023-05-17 LAB — WET PREP, GENITAL
Clue Cells Wet Prep HPF POC: NONE SEEN
Sperm: NONE SEEN
Trich, Wet Prep: NONE SEEN
WBC, Wet Prep HPF POC: <10 (ref <10)
Yeast Wet Prep HPF POC: NONE SEEN

## 2023-05-17 MED ORDER — CEFTRIAXONE SODIUM 500 MG IJ SOLR
500.0000 mg | Freq: Once | INTRAMUSCULAR | Status: AC
  Administered 2023-05-17: 500 mg via INTRAMUSCULAR
  Filled 2023-05-17: qty 500

## 2023-05-17 MED ORDER — DOXYCYCLINE HYCLATE 100 MG PO TABS
100.0000 mg | ORAL_TABLET | Freq: Two times a day (BID) | ORAL | 0 refills | Status: AC
  Filled 2023-05-17: qty 14, 7d supply, fill #0

## 2023-05-17 MED ORDER — LIDOCAINE HCL (PF) 1 % IJ SOLN
1.0000 mL | Freq: Once | INTRAMUSCULAR | Status: AC
  Administered 2023-05-17: 5 mL
  Filled 2023-05-17: qty 5
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
## 2023-05-17 NOTE — Discharge Instructions (Addendum)
It was a pleasure taking care of you today!  You have pending lab results for STI work-up. You also have pending labs for hepatitis. You may see the results of your labs on MyChart.  You were treated today for gonorrhea and chlamydia due to your symptoms and exposure. You will be sent a prescription for doxycycline. Ensure to complete the entire course of antibiotics prescribed. You may follow up with your primary care provider or Health Department if you are experiencing continued symptoms.   Ensure to practice safe sex with condom use. Return to the Emergency Department if you are experiencing increasing/worsening symptoms.

## 2023-05-17 NOTE — ED Notes (Signed)
Pt self swabbed,unable to void at this time. Pt is drinking Gingerale in order to provide urine sample
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
## 2023-05-18 LAB — HCV INTERPRETATION

## 2023-05-18 LAB — HCV AB W REFLEX TO QUANT PCR: HCV Ab: NONREACTIVE

## 2023-05-18 LAB — HEPATITIS B SURFACE ANTIGEN: Hepatitis B Surface Ag: NONREACTIVE

## 2023-05-18 LAB — HIV ANTIBODY (ROUTINE TESTING W REFLEX): HIV Screen 4th Generation wRfx: NONREACTIVE

## 2023-05-18 LAB — GC/CHLAMYDIA PROBE AMP (~~LOC~~) NOT AT ARMC
Chlamydia: NEGATIVE
Comment: NEGATIVE
Comment: NORMAL
Neisseria Gonorrhea: NEGATIVE

## 2023-07-07 ENCOUNTER — Other Ambulatory Visit (HOSPITAL_COMMUNITY): Payer: Self-pay

## 2023-07-23 ENCOUNTER — Other Ambulatory Visit: Payer: Self-pay

## 2023-07-23 ENCOUNTER — Emergency Department (HOSPITAL_COMMUNITY)
Admission: EM | Admit: 2023-07-23 | Payer: Commercial Managed Care - HMO | Source: Home / Self Care | Attending: Emergency Medicine | Admitting: Emergency Medicine

## 2023-07-23 ENCOUNTER — Emergency Department (HOSPITAL_COMMUNITY): Payer: Commercial Managed Care - HMO

## 2023-07-23 DIAGNOSIS — W19XXXA Unspecified fall, initial encounter: Secondary | ICD-10-CM

## 2023-07-23 DIAGNOSIS — S0101XA Laceration without foreign body of scalp, initial encounter: Secondary | ICD-10-CM | POA: Insufficient documentation

## 2023-07-23 DIAGNOSIS — S0121XA Laceration without foreign body of nose, initial encounter: Secondary | ICD-10-CM | POA: Insufficient documentation

## 2023-07-23 DIAGNOSIS — W130XXA Fall from, out of or through balcony, initial encounter: Secondary | ICD-10-CM | POA: Insufficient documentation

## 2023-07-23 DIAGNOSIS — S0990XA Unspecified injury of head, initial encounter: Secondary | ICD-10-CM | POA: Diagnosis present

## 2023-07-23 NOTE — ED Notes (Signed)
Encouraged patient to urinate for pregnancy test but refusing to wake up. Stated she doesn't have to urinate yet and will urinate when she is ready. Educated pt on the need to get pregnancy test done. Will attempt again shortly
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
## 2023-07-24 NOTE — ED Notes (Signed)
Pt refusing to get vital signs and curing at this nurse. Pt stated she needed to urinate but refused to get up. Pt continued to not respond to this nurse.

## 2023-07-24 NOTE — Discharge Instructions (Signed)
You can apply bacitracin to your scalp laceration. This wound has been open too long to close with stitches. Please follow up with your primary care provider for recheck.

## 2024-01-18 ENCOUNTER — Other Ambulatory Visit: Payer: Self-pay

## 2024-01-18 DIAGNOSIS — L03115 Cellulitis of right lower limb: Secondary | ICD-10-CM | POA: Insufficient documentation

## 2024-01-18 DIAGNOSIS — S0031XA Abrasion of nose, initial encounter: Secondary | ICD-10-CM | POA: Diagnosis not present

## 2024-01-18 DIAGNOSIS — F17200 Nicotine dependence, unspecified, uncomplicated: Secondary | ICD-10-CM | POA: Insufficient documentation

## 2024-01-18 DIAGNOSIS — M7989 Other specified soft tissue disorders: Secondary | ICD-10-CM | POA: Diagnosis present

## 2024-01-18 DIAGNOSIS — X58XXXA Exposure to other specified factors, initial encounter: Secondary | ICD-10-CM | POA: Insufficient documentation
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
## 2024-01-19 ENCOUNTER — Emergency Department (HOSPITAL_BASED_OUTPATIENT_CLINIC_OR_DEPARTMENT_OTHER)
Admission: EM | Admit: 2024-01-19 | Discharge: 2024-01-19 | Disposition: A | Discharge status: Home / Self Care | Payer: Commercial Managed Care - HMO | Attending: Emergency Medicine | Admitting: Emergency Medicine

## 2024-01-19 DIAGNOSIS — L089 Local infection of the skin and subcutaneous tissue, unspecified: Secondary | ICD-10-CM

## 2024-01-19 DIAGNOSIS — L03115 Cellulitis of right lower limb: Secondary | ICD-10-CM

## 2024-01-19 LAB — COMPREHENSIVE METABOLIC PANEL
ALT: 8 U/L (ref 0–44)
AST: 13 U/L — ABNORMAL LOW (ref 15–41)
Albumin: 4.4 g/dL (ref 3.5–5.0)
Alkaline Phosphatase: 60 U/L (ref 38–126)
Anion gap: 9 (ref 5–15)
BUN: 6 mg/dL (ref 6–20)
CO2: 26 mmol/L (ref 22–32)
Calcium: 9.2 mg/dL (ref 8.9–10.3)
Chloride: 100 mmol/L (ref 98–111)
Creatinine, Ser: 0.62 mg/dL (ref 0.44–1.00)
GFR, Estimated: >60 mL/min (ref >60)
Glucose, Bld: 121 mg/dL — ABNORMAL HIGH (ref 70–99)
Potassium: 3.3 mmol/L — ABNORMAL LOW (ref 3.5–5.1)
Sodium: 135 mmol/L (ref 135–145)
Total Bilirubin: 0.3 mg/dL (ref 0.0–1.2)
Total Protein: 7.6 g/dL (ref 6.5–8.1)

## 2024-01-19 LAB — LACTIC ACID, PLASMA: Lactic Acid, Venous: 0.9 mmol/L (ref 0.5–1.9)

## 2024-01-19 LAB — CBC WITH DIFFERENTIAL/PLATELET
Abs Immature Granulocytes: 0.04 10*3/uL (ref 0.00–0.07)
Basophils Absolute: 0 10*3/uL (ref 0.0–0.1)
Basophils Relative: 0 %
Eosinophils Absolute: 0.1 10*3/uL (ref 0.0–0.5)
Eosinophils Relative: 1 %
HCT: 34.8 % — ABNORMAL LOW (ref 36.0–46.0)
Hemoglobin: 11.1 g/dL — ABNORMAL LOW (ref 12.0–15.0)
Immature Granulocytes: 0 %
Lymphocytes Relative: 29 %
Lymphs Abs: 2.7 10*3/uL (ref 0.7–4.0)
MCH: 27.5 pg (ref 26.0–34.0)
MCHC: 31.9 g/dL (ref 30.0–36.0)
MCV: 86.4 fL (ref 80.0–100.0)
Monocytes Absolute: 0.8 10*3/uL (ref 0.1–1.0)
Monocytes Relative: 9 %
Neutro Abs: 5.6 10*3/uL (ref 1.7–7.7)
Neutrophils Relative %: 61 %
Platelets: 451 10*3/uL — ABNORMAL HIGH (ref 150–400)
RBC: 4.03 MIL/uL (ref 3.87–5.11)
RDW: 15.9 % — ABNORMAL HIGH (ref 11.5–15.5)
WBC: 9.3 10*3/uL (ref 4.0–10.5)
nRBC: 0 % (ref 0.0–0.2)

## 2024-01-19 MED ORDER — CLINDAMYCIN HCL 300 MG PO CAPS: 600.0000 mg | ORAL_CAPSULE | Freq: Three times a day (TID) | ORAL | 0 refills | Status: AC

## 2024-01-19 MED ORDER — MUPIROCIN CALCIUM 2 % EX CREA: 1.0000 | TOPICAL_CREAM | Freq: Two times a day (BID) | CUTANEOUS | 0 refills | Status: AC

## 2024-01-19 MED ORDER — CLINDAMYCIN HCL 150 MG PO CAPS
300.0000 mg | ORAL_CAPSULE | Freq: Once | ORAL | Status: AC
  Administered 2024-01-19: 300 mg via ORAL
  Filled 2024-01-19: qty 2

## 2024-01-19 NOTE — ED Notes (Signed)
 Patient sleeping
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
